# Patient Record
Sex: Male | Born: 1950 | Race: White | Hispanic: No | Smoking: Former smoker
Health system: Southern US, Community
[De-identification: ages and names within clinical notes are randomized; demographics above are authoritative.]

## PROBLEM LIST (undated history)

## (undated) DIAGNOSIS — M1990 Unspecified osteoarthritis, unspecified site: Secondary | ICD-10-CM

## (undated) DIAGNOSIS — I10 Essential (primary) hypertension: Secondary | ICD-10-CM

## (undated) DIAGNOSIS — K219 Gastro-esophageal reflux disease without esophagitis: Secondary | ICD-10-CM

## (undated) DIAGNOSIS — H269 Unspecified cataract: Secondary | ICD-10-CM

## (undated) DIAGNOSIS — I429 Cardiomyopathy, unspecified: Secondary | ICD-10-CM

## (undated) DIAGNOSIS — F32A Depression, unspecified: Secondary | ICD-10-CM

## (undated) DIAGNOSIS — I2510 Atherosclerotic heart disease of native coronary artery without angina pectoris: Secondary | ICD-10-CM

## (undated) DIAGNOSIS — I25118 Atherosclerotic heart disease of native coronary artery with other forms of angina pectoris: Secondary | ICD-10-CM

## (undated) DIAGNOSIS — Z87442 Personal history of urinary calculi: Secondary | ICD-10-CM

## (undated) DIAGNOSIS — I493 Ventricular premature depolarization: Secondary | ICD-10-CM

## (undated) DIAGNOSIS — R0600 Dyspnea, unspecified: Secondary | ICD-10-CM

## (undated) DIAGNOSIS — E119 Type 2 diabetes mellitus without complications: Secondary | ICD-10-CM

## (undated) DIAGNOSIS — F329 Major depressive disorder, single episode, unspecified: Secondary | ICD-10-CM

## (undated) DIAGNOSIS — F419 Anxiety disorder, unspecified: Secondary | ICD-10-CM

## (undated) HISTORY — DX: Cardiomyopathy, unspecified: I42.9

## (undated) HISTORY — DX: Atherosclerotic heart disease of native coronary artery with other forms of angina pectoris: I25.118

## (undated) HISTORY — PX: LUNG SURGERY: SHX703

## (undated) HISTORY — DX: Essential (primary) hypertension: I10

## (undated) HISTORY — PX: FOOT SURGERY: SHX648

## (undated) HISTORY — DX: Type 2 diabetes mellitus without complications: E11.9

## (undated) HISTORY — PX: CARDIAC CATHETERIZATION: SHX172

## (undated) HISTORY — DX: Ventricular premature depolarization: I49.3

---
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
## 2014-12-29 DIAGNOSIS — E119 Type 2 diabetes mellitus without complications: Secondary | ICD-10-CM

## 2014-12-29 DIAGNOSIS — I10 Essential (primary) hypertension: Secondary | ICD-10-CM

## 2014-12-29 DIAGNOSIS — I493 Ventricular premature depolarization: Secondary | ICD-10-CM | POA: Insufficient documentation

## 2014-12-29 HISTORY — DX: Essential (primary) hypertension: I10

## 2014-12-29 HISTORY — DX: Type 2 diabetes mellitus without complications: E11.9

## 2014-12-29 HISTORY — DX: Ventricular premature depolarization: I49.3

## 2015-03-18 DIAGNOSIS — I25118 Atherosclerotic heart disease of native coronary artery with other forms of angina pectoris: Secondary | ICD-10-CM

## 2015-03-18 HISTORY — DX: Atherosclerotic heart disease of native coronary artery with other forms of angina pectoris: I25.118

## 2015-04-19 DIAGNOSIS — I429 Cardiomyopathy, unspecified: Secondary | ICD-10-CM

## 2015-04-19 HISTORY — DX: Cardiomyopathy, unspecified: I42.9

## 2018-03-18 ENCOUNTER — Encounter: Payer: Self-pay | Admitting: Cardiology

## 2018-03-18 ENCOUNTER — Ambulatory Visit (INDEPENDENT_AMBULATORY_CARE_PROVIDER_SITE_OTHER): Payer: Medicare Other | Admitting: Cardiology

## 2018-03-18 VITALS — BP 130/82 | HR 59 | Ht 68.0 in | Wt 211.0 lb

## 2018-03-18 DIAGNOSIS — I493 Ventricular premature depolarization: Secondary | ICD-10-CM | POA: Diagnosis not present

## 2018-03-18 DIAGNOSIS — E119 Type 2 diabetes mellitus without complications: Secondary | ICD-10-CM

## 2018-03-18 DIAGNOSIS — I10 Essential (primary) hypertension: Secondary | ICD-10-CM | POA: Diagnosis not present

## 2018-03-18 DIAGNOSIS — R001 Bradycardia, unspecified: Secondary | ICD-10-CM

## 2018-03-18 HISTORY — DX: Bradycardia, unspecified: R00.1

## 2018-03-18 NOTE — Progress Notes (Signed)
Cardiology Office Note:    Date:  03/18/2018   ID:  Jerry Myers, DOB 03-Jul-1950, MRN [id number]  PCP:  Mikael Spray, NP  Cardiologist:  Garwin Brothers, MD   Referring MD: Mikael Spray, NP    ASSESSMENT:    1. Ventricular extrasystoles   2. Essential hypertension   3. Type 2 diabetes mellitus without complication, without long-term current use of insulin (HCC)   4. Bradycardia    PLAN:    In order of problems listed above:  1. Primary prevention stressed with the patient.  Importance of compliance with diet and medication stressed and he vocalized understanding.  His blood pressure is stable.  Diet was discussed for diabetes mellitus also. 2. His blood pressure is stable.  His heart rate is stable.  I will do a 48-hour monitor to assess his bradycardia if there are any significant concerns.  His TSH was normal at [facility]. 3. I reviewed records at [facility] extensively and questions were answered to his satisfaction. 4. Echocardiogram will be done to assess murmur heard on auscultation.  It will also help me assess his left ventricular systolic function in view of congestive heart failure history. 5. Patient will undergo exercise stress Cardiolite in view of multiple risk factors for coronary artery disease and history of coronary artery disease mentioned in his chart. 6. Patient will be seen in follow-up appointment in 6 months or earlier if the patient has any concerns    Medication Adjustments/Labs and Tests Ordered: Current medicines are reviewed at length with the patient today.  Concerns regarding medicines are outlined above.  No orders of the defined types were placed in this encounter.  No orders of the defined types were placed in this encounter.    No chief complaint on file.    History of Present Illness:    Jerry Myers is a 67 y.o. male.  Patient was evaluated by my partner in the past.  He was evaluated in the previous practice  and now he wants to get established here.  He has history of cardiomyopathy and I do not have much details about this.  No chest pain orthopnea or PND.  He denies coronary artery disease.  He went to the emergency room because he was sent by his primary care provider because of bradycardia.  Patient denies any symptoms of dizziness syncope or any such problems.  At the time of my evaluation, the patient is alert awake oriented and in no distress.  He is active gentleman but leads a sedentary lifestyle.  Past Medical History:  Diagnosis Date  . Cardiomyopathy (HCC) 04/19/2015   Ejection fraction 4045% in the fall of 2016  . Coronary artery disease of native artery of native heart with stable angina pectoris (HCC) 03/18/2015  . Essential hypertension 12/29/2014  . Type 2 diabetes mellitus without complication (HCC) 12/29/2014  . 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 FOOT SURGERY    . LUNG SURGERY      Current Medications: Current Meds  Medication Sig  . amLODipine (NORVASC) 5 MG tablet Take 5 mg by mouth daily.  Marland Kitchen aspirin EC 81 MG tablet Take 81 mg by mouth daily.  Marland Kitchen atorvastatin (LIPITOR) 20 MG tablet Take 20 mg by mouth daily.  Marland Kitchen gabapentin (NEURONTIN) 300 MG capsule Take 300 mg by mouth 2 (two) times daily.  Marland Kitchen glimepiride (AMARYL) 1 MG tablet Take 1 mg by mouth daily with breakfast.  .  glipiZIDE (GLUCOTROL XL) 5 MG 24 hr tablet Take 5 mg by mouth daily with breakfast.  . lisinopril (PRINIVIL,ZESTRIL) 20 MG tablet Take 20 mg by mouth daily.  . meloxicam (MOBIC) 7.5 MG tablet Take 7.5 mg by mouth daily.  . metFORMIN (GLUCOPHAGE) 500 MG tablet Take 500 mg by mouth daily.  . metoprolol succinate (TOPROL-XL) 25 MG 24 hr tablet Take 1.5 tablets by mouth daily.  . sitaGLIPtin (JANUVIA) 100 MG tablet Take 100 mg by mouth daily.     Allergies:   Patient has no known allergies.   Social History   Socioeconomic History  . Marital status:  Married    Spouse name: Not on file  . Number of children: Not on file  . Years of education: Not on file  . Highest education level: Not on file  Occupational History  . Not on file  Social Needs  . Financial resource strain: Not on file  . Food insecurity:    Worry: Not on file    Inability: Not on file  . Transportation needs:    Medical: Not on file    Non-medical: Not on file  Tobacco Use  . Smoking status: Never Smoker  . Smokeless tobacco: Never Used  Substance and Sexual Activity  . Alcohol use: Not on file  . Drug use: Not on file  . Sexual activity: Not on file  Lifestyle  . Physical activity:    Days per week: Not on file    Minutes per session: Not on file  . Stress: Not on file  Relationships  . Social connections:    Talks on phone: Not on file    Gets together: Not on file    Attends religious service: Not on file    Active member of club or organization: Not on file    Attends meetings of clubs or organizations: Not on file    Relationship status: Not on file  Other Topics Concern  . Not on file  Social History Narrative  . Not on file     Family History: The patient's family history is not on file.  ROS:   Please see the history of present illness.    All other systems reviewed and are negative.  EKGs/Labs/Other Studies Reviewed:    The following studies were reviewed today: EKG reveals sinus rhythm and nonspecific ST-T changes.   Recent Labs: No results found for requested labs within last 8760 hours.  Recent Lipid Panel No results found for: CHOL, TRIG, HDL, CHOLHDL, VLDL, LDLCALC, LDLDIRECT  Physical Exam:    VS:  BP 130/82 (BP Location: Right Arm, Patient Position: Sitting, Cuff Size: Normal)   Pulse (!) 59   Ht 5\' 8"  (1.727 m)   Wt 211 lb (95.7 kg)   SpO2 98%   BMI 32.08 kg/m     Wt Readings from Last 3 Encounters:  03/18/18 211 lb (95.7 kg)     GEN: Patient is in no acute distress HEENT: Normal NECK: No JVD; No carotid  bruits LYMPHATICS: No lymphadenopathy CARDIAC: Hear sounds regular, 2/6 systolic murmur at the apex. RESPIRATORY:  Clear to auscultation without rales, wheezing or rhonchi  ABDOMEN: Soft, non-tender, non-distended MUSCULOSKELETAL:  No edema; No deformity  SKIN: Warm and dry NEUROLOGIC:  Alert and oriented x 3 PSYCHIATRIC:  Normal affect   Signed, Garwin Brothersajan R Revankar, MD  03/18/2018 12:00 PM     [facility]

## 2018-03-18 NOTE — Patient Instructions (Signed)
Medication Instructions:  Your physician recommends that you continue on your current medications as directed. Please refer to the Current Medication list given to you today.  If you need a refill on your cardiac medications before your next appointment, please call your pharmacy.   Lab work: None  If you have labs (blood work) drawn today and your tests are completely normal, you will receive your results only by: Marland Kitchen. MyChart Message (if you have MyChart) OR . A paper copy in the mail If you have any lab test that is abnormal or we need to change your treatment, we will call you to review the results.  Testing/Procedures: Your physician has requested that you have an echocardiogram. Echocardiography is a painless test that uses sound waves to create images of your heart. It provides your doctor with information about the size and shape of your heart and how well your heart's chambers and valves are working. This procedure takes approximately one hour. There are no restrictions for this procedure.  Your physician has requested that you have en exercise stress myoview. For further information please visit [email or web address]. Please follow instruction sheet, as given.  Your physician has recommended that you wear a holter monitor. Holter monitors are medical devices that record the heart's electrical activity. Doctors most often use these monitors to diagnose arrhythmias. Arrhythmias are problems with the speed or rhythm of the heartbeat. The monitor is a small, portable device. You can wear one while you do your normal daily activities. This is usually used to diagnose what is causing palpitations/syncope (passing out).  Follow-Up: At [facility], you and your health needs are our priority.  As part of our continuing mission to provide you with exceptional heart care, we have created designated Provider Care Teams.  These Care Teams include your primary Cardiologist (physician) and Advanced  Practice Providers (APPs -  Physician Assistants and Nurse Practitioners) who all work together to provide you with the care you need, when you need it.  You will need a follow up appointment in 6 months.  Please call our office 2 months in advance to schedule this appointment.  You may see another member of our [facility] Provider Team in [place]: Gypsy Balsamobert Krasowski, MD . Norman HerrlichBrian Munley, MD  Any Other Special Instructions Will Be Listed Below (If Applicable).

## 2018-04-17 ENCOUNTER — Telehealth: Payer: Self-pay

## 2018-04-17 NOTE — Telephone Encounter (Signed)
patients home health nurse called and states patients heart rate was 35  And also patient takes metoprolol 25 succinate half a tablet daily but patient was feeling good so per Dr.Revankar wants patient to stop metoprolol and call us next week and let us know how he is feeling.

## 2018-05-08 ENCOUNTER — Other Ambulatory Visit: Payer: Medicare Other

## 2018-05-09 ENCOUNTER — Ambulatory Visit (INDEPENDENT_AMBULATORY_CARE_PROVIDER_SITE_OTHER): Payer: Medicare Other

## 2018-05-09 ENCOUNTER — Encounter: Payer: Self-pay | Admitting: *Deleted

## 2018-05-09 ENCOUNTER — Other Ambulatory Visit: Payer: Medicare Other

## 2018-05-09 DIAGNOSIS — I493 Ventricular premature depolarization: Secondary | ICD-10-CM

## 2018-05-09 NOTE — Progress Notes (Signed)
Complete echocardiogram has been performed.  Jimmy Jo Cerone, RDCS, RVT 

## 2018-05-12 ENCOUNTER — Telehealth: Payer: Self-pay | Admitting: Emergency Medicine

## 2018-05-12 DIAGNOSIS — Z8679 Personal history of other diseases of the circulatory system: Secondary | ICD-10-CM

## 2018-05-12 NOTE — Telephone Encounter (Signed)
Patient returned your call.

## 2018-05-12 NOTE — Telephone Encounter (Signed)
Left message for patient to return call.

## 2018-05-13 NOTE — Telephone Encounter (Signed)
Called patient back. Per Dr. Tomie China patient informed we will switch exercise myoview to lexiscan. Went over instructions for this testing with patient again. Advised patient to hold metformin, glipizide, glimepiride, hydrochlorothiazide, and januvia the day of the test. Also informed patient to hold metoprolol 24 hours before the test.  Patient verbally understands and will call us with any questions. Same appointment will remain, patient aware

## 2018-05-13 NOTE — Addendum Note (Signed)
Addended by: Lita Mains on: 05/13/2018 08:39 AM   Modules accepted: Orders

## 2018-05-14 ENCOUNTER — Telehealth (HOSPITAL_COMMUNITY): Payer: Self-pay | Admitting: *Deleted

## 2018-05-14 NOTE — Telephone Encounter (Signed)
Patient given detailed instructions per Myocardial Perfusion Study Information Sheet for the test on 05/28/18 at 0830. Patient notified to arrive 15 minutes early and that it is imperative to arrive on time for appointment to keep from having the test rescheduled.  If you need to cancel or reschedule your appointment, please call the office within 24 hours of your appointment. . Patient verbalized understanding.Sharne Linders, Adelene Idler

## 2018-05-14 NOTE — Telephone Encounter (Signed)
Patient called about changing his appointment to 2/5 at 0830 instead of 2/4 at 1100 due to a patient on 2/5 needs to be a 2D instead of 1D. Patient agreed to change his appointment but waiting on 2/5 patient to call back.Soraya Paquette, Adelene Idler

## 2018-05-26 ENCOUNTER — Telehealth (HOSPITAL_COMMUNITY): Payer: Self-pay | Admitting: *Deleted

## 2018-05-26 NOTE — Telephone Encounter (Signed)
Left message on voicemail per DPR in reference to upcoming appointment scheduled on 05/28/18 with detailed instructions given per Myocardial Perfusion Study Information Sheet for the test. LM to arrive 15 minutes early, and that it is imperative to arrive on time for appointment to keep from having the test rescheduled. If you need to cancel or reschedule your appointment, please call the office within 24 hours of your appointment. Failure to do so may result in a cancellation of your appointment, and a $50 no show fee. Phone number given for call back for any questions. Aldean Suddeth Jacqueline    

## 2018-05-28 ENCOUNTER — Ambulatory Visit (INDEPENDENT_AMBULATORY_CARE_PROVIDER_SITE_OTHER): Payer: Medicare Other

## 2018-05-28 VITALS — Ht 68.0 in | Wt 211.0 lb

## 2018-05-28 DIAGNOSIS — E119 Type 2 diabetes mellitus without complications: Secondary | ICD-10-CM

## 2018-05-28 DIAGNOSIS — I420 Dilated cardiomyopathy: Secondary | ICD-10-CM | POA: Diagnosis not present

## 2018-05-28 DIAGNOSIS — R9431 Abnormal electrocardiogram [ECG] [EKG]: Secondary | ICD-10-CM | POA: Diagnosis not present

## 2018-05-28 DIAGNOSIS — I10 Essential (primary) hypertension: Secondary | ICD-10-CM

## 2018-05-28 LAB — MYOCARDIAL PERFUSION IMAGING
CHL CUP NUCLEAR SRS: 9
CHL CUP NUCLEAR SSS: 14
LV dias vol: 2 mL (ref 62–150)
LV sys vol: 121 mL
NUC STRESS TID: 1
Peak HR: 84 {beats}/min
Rest BP: 57 mmHg
Rest HR: 57 {beats}/min
SDS: 5

## 2018-05-28 MED ORDER — TECHNETIUM TC 99M TETROFOSMIN IV KIT
31.1000 | PACK | Freq: Once | INTRAVENOUS | Status: AC | PRN
  Administered 2018-05-28: 31.1 via INTRAVENOUS

## 2018-05-28 MED ORDER — REGADENOSON 0.4 MG/5ML IV SOLN
0.4000 mg | Freq: Once | INTRAVENOUS | Status: AC
  Administered 2018-05-28: 0.4 mg via INTRAVENOUS

## 2018-05-28 MED ORDER — TECHNETIUM TC 99M TETROFOSMIN IV KIT
9.8000 | PACK | Freq: Once | INTRAVENOUS | Status: AC | PRN
  Administered 2018-05-28: 9.8 via INTRAVENOUS

## 2018-06-03 ENCOUNTER — Ambulatory Visit (INDEPENDENT_AMBULATORY_CARE_PROVIDER_SITE_OTHER): Payer: Medicare Other | Admitting: Cardiology

## 2018-06-03 ENCOUNTER — Encounter: Payer: Self-pay | Admitting: Cardiology

## 2018-06-03 VITALS — BP 162/70 | HR 86 | Ht 68.0 in | Wt 212.0 lb

## 2018-06-03 DIAGNOSIS — I4729 Other ventricular tachycardia: Secondary | ICD-10-CM | POA: Insufficient documentation

## 2018-06-03 DIAGNOSIS — I10 Essential (primary) hypertension: Secondary | ICD-10-CM

## 2018-06-03 DIAGNOSIS — R931 Abnormal findings on diagnostic imaging of heart and coronary circulation: Secondary | ICD-10-CM

## 2018-06-03 DIAGNOSIS — R001 Bradycardia, unspecified: Secondary | ICD-10-CM

## 2018-06-03 DIAGNOSIS — I472 Ventricular tachycardia: Secondary | ICD-10-CM

## 2018-06-03 DIAGNOSIS — I493 Ventricular premature depolarization: Secondary | ICD-10-CM | POA: Diagnosis not present

## 2018-06-03 DIAGNOSIS — E119 Type 2 diabetes mellitus without complications: Secondary | ICD-10-CM

## 2018-06-03 HISTORY — DX: Abnormal findings on diagnostic imaging of heart and coronary circulation: R93.1

## 2018-06-03 HISTORY — DX: Ventricular tachycardia: I47.2

## 2018-06-03 HISTORY — DX: Other ventricular tachycardia: I47.29

## 2018-06-03 NOTE — Patient Instructions (Addendum)
Medication Instructions:   Your physician recommends that you continue on your current medications as directed. Please refer to the Current Medication list given to you today.   If you need a refill on your cardiac medications before your next appointment, please call your pharmacy.   Lab work:  Your physician recommends that you return for lab work today: BMP, CBC   If you have labs (blood work) drawn today and your tests are completely normal, you will receive your results only by: Marland Kitchen. MyChart Message (if you have MyChart) OR . A paper copy in the mail If you have any lab test that is abnormal or we need to change your treatment, we will call you to review the results.  Testing/Procedures:  A chest x-ray takes a picture of the organs and structures inside the chest, including the heart, lungs, and blood vessels. This test can show several things, including, whether the heart is enlarges; whether fluid is building up in the lungs; and whether pacemaker / defibrillator leads are still in place.     [facility] AT [place] [address] [place] [id number] Dept: [phone] Loc: [phone]  Jerry Myers  06/03/2018  You are scheduled for a Cardiac Catheterization on Thursday, February 13 with Dr. Cristal Deerhristopher End.  1. [facility]: [address] [place], [place] [address] at 5:30 AM (This time is two hours before your procedure to ensure your preparation). Free valet parking service is available.   Special note: Every effort is made to have your procedure done on time. Please understand that emergencies sometimes delay scheduled procedures.  2. Diet: Do not eat solid foods after midnight.  The patient may have clear liquids until 5am upon the day of the procedure.  3. Labs: You will need to have blood drawn today: BMP, CBC  4. Medication  instructions in preparation for your procedure:   Contrast Allergy: NKA   STOP Hydrochlorothiazide the morning of the cath.  Do not take diabetic medication the day of the cath and Hold 48 hours after:  Metformin   Hold Januvia, Glipizide, Glimepiride the day of the cath.   On the morning of your procedure, take your Aspirin and any morning medicines NOT listed above.  You may use sips of water.  5. Plan for one night stay--bring personal belongings. 6. Bring a current list of your medications and current insurance cards. 7. You MUST have a responsible person to drive you home. 8. Someone MUST be with you the first 24 hours after you arrive home or your discharge will be delayed. 9. Please wear clothes that are easy to get on and off and wear slip-on shoes.  Thank you for allowing us to care for you!   -- [facility]   Follow-Up: At [facility], you and your health needs are our priority.  As part of our continuing mission to provide you with exceptional heart care, we have created designated Provider Care Teams.  These Care Teams include your primary Cardiologist (physician) and Advanced Practice Providers (APPs -  Physician Assistants and Nurse Practitioners) who all work together to provide you with the care you need, when you need it.  . You will need a follow up appointment in 1 months.    Any Other Special Instructions Will Be Listed Below (If Applicable).      Coronary Angiogram With Stent  Coronary angiogram with stent placement is a procedure to widen or open a narrow blood vessel of the heart (coronary artery). Arteries may become blocked by cholesterol buildup (plaques) in the lining of the wall. When a coronary artery becomes partially blocked, blood flow to that area decreases. This may lead to chest pain or a heart attack (myocardial infarction). A stent is a small piece of metal that looks like mesh or a spring. Stent placement may  be done as treatment for a heart attack or right after a coronary angiogram in which a blocked artery is found. Let your health care provider know about:  Any allergies you have.  All medicines you are taking, including vitamins, herbs, eye drops, creams, and over-the-counter medicines.  Any problems you or family members have had with anesthetic medicines.  Any blood disorders you have.  Any surgeries you have had.  Any medical conditions you have.  Whether you are pregnant or may be pregnant. What are the risks? Generally, this is a safe procedure. However, problems may occur, including:  Damage to the heart or its blood vessels.  A return of blockage.  Bleeding, infection, or bruising at the insertion site.  A collection of blood under the skin (hematoma) at the insertion site.  A blood clot in another part of the body.  Kidney injury.  Allergic reaction to the dye or contrast that is used.  Bleeding into the abdomen (retroperitoneal bleeding). What happens before the procedure? Staying hydrated Follow instructions from your health care provider about hydration, which may include:  Up to 2 hours before the procedure - you may continue to drink clear liquids, such as water, clear fruit juice, black coffee, and plain tea.  Eating and drinking restrictions Follow instructions from your health care provider about eating and drinking, which may include:  8 hours before the procedure - stop eating heavy meals or foods such as meat, fried foods, or fatty foods.  6 hours before the procedure - stop eating light meals or foods, such as toast or cereal.  2 hours before the procedure - stop drinking clear liquids. Ask your health care provider about:  Changing or stopping your regular medicines. This is especially important if you are taking diabetes medicines or blood thinners.  Taking medicines such as ibuprofen. These medicines can thin your blood. Do not take these  medicines before your procedure if your health care provider instructs you not to. Generally, aspirin is recommended before a procedure of passing a small, thin tube (catheter) through a blood vessel and into the heart (cardiac catheterization). What happens during the procedure?   An IV tube will be inserted into one of your veins.  You will be given one or more of the following: ? A medicine to help you relax (sedative). ? A medicine to numb the area where the catheter will be inserted into an artery (local anesthetic).  To reduce your risk of infection: ? Your health care team will wash or sanitize their hands. ? Your skin will be washed with soap. ? Hair may be removed from the area where the catheter will be inserted.  Using a guide wire, the catheter will be inserted into an artery. The location may be in your groin, in your wrist, or in the fold of your arm (near your elbow).  A type of X-ray (fluoroscopy) will be used to help guide the catheter to the opening of the arteries in the heart.  A dye will be injected  into the catheter, and X-rays will be taken. The dye will help to show where any narrowing or blockages are located in the arteries.  A tiny wire will be guided to the blocked spot, and a balloon will be inflated to make the artery wider.  The stent will be expanded and will crush the plaques into the wall of the vessel. The stent will hold the area open and improve the blood flow. Most stents have a drug coating to reduce the risk of the stent narrowing over time.  The artery may be made wider using a drill, laser, or other tools to remove plaques.  When the blood flow is better, the catheter will be removed. The lining of the artery will grow over the stent, which stays where it was placed. This procedure may vary among health care providers and hospitals. What happens after the procedure?  If the procedure is done through the leg, you will be kept in bed lying flat  for about 6 hours. You will be instructed to not bend and not cross your legs.  The insertion site will be checked frequently.  The pulse in your foot or wrist will be checked frequently.  You may have additional blood tests, X-rays, and a test that records the electrical activity of your heart (electrocardiogram, or ECG). This information is not intended to replace advice given to you by your health care provider. Make sure you discuss any questions you have with your health care provider. Document Released: 10/14/2002 Document Revised: 07/19/2017 Document Reviewed: 11/13/2015 Elsevier Interactive Patient Education  2019 [employer].

## 2018-06-03 NOTE — H&P (View-Only) (Signed)
Cardiology Office Note:    Date:  06/03/2018   ID:  Jerry Myers, DOB 03-29-1951, MRN [id number]  PCP:  Mikael Spray, NP  Cardiologist:  Garwin Brothers, MD   Referring MD: Mikael Spray, NP    ASSESSMENT:    1. Abnormal nuclear cardiac imaging test   2. Cardiomyopathy, unspecified type (HCC)   3. Essential hypertension   4. Ventricular extrasystoles   5. Bradycardia   6. Type 2 diabetes mellitus without complication, without long-term current use of insulin (HCC)    PLAN:    In order of problems listed above:  1. Primary prevention stressed with the patient.  Importance of compliance with diet and medication stressed and he vocalized understanding.  He has multiple risk factors for coronary artery disease including diabetes mellitus.  He has a sedentary lifestyle.  Holter monitoring has revealed nonsustained ventricular tachycardia.  In view of this the following recommendations were made to the patient. 2. I discussed coronary angiography and left heart catheterization with the patient at extensive length. Procedure, benefits and potential risks were explained. Patient had multiple questions which were answered to the patient's satisfaction. Patient agreed and consented for the procedure. Further recommendations will be made based on the findings of the coronary angiography. In the interim. The patient has any significant symptoms he knows to go to the nearest emergency room.    Medication Adjustments/Labs and Tests Ordered: Current medicines are reviewed at length with the patient today.  Concerns regarding medicines are outlined above.  No orders of the defined types were placed in this encounter.  No orders of the defined types were placed in this encounter.    No chief complaint on file.    History of Present Illness:    Jerry Myers is a 68 y.o. male.  Patient has past medical history of essential hypertension, dyslipidemia and diabetes mellitus.  The  patient was evaluated for shortness of breath on exertion and had stress testing which revealed reversible defects suggesting ischemia and he is here for that evaluation.  He denies any chest pain.  He leads a very sedentary lifestyle.  At the time of my evaluation, the patient is alert awake oriented and in no distress.  His sister accompanies him for the visit.  Past Medical History:  Diagnosis Date  . Cardiomyopathy (HCC) 04/19/2015   Ejection fraction 4045% in the fall of 2016  . Coronary artery disease of native artery of native heart with stable angina pectoris (HCC) 03/18/2015  . Essential hypertension 12/29/2014  . Type 2 diabetes mellitus without complication (HCC) 12/29/2014  . 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 FOOT SURGERY    . LUNG SURGERY      Current Medications: Current Meds  Medication Sig  . amLODipine (NORVASC) 5 MG tablet Take 5 mg by mouth daily.  Marland Kitchen aspirin EC 81 MG tablet Take 81 mg by mouth daily.  Marland Kitchen atorvastatin (LIPITOR) 20 MG tablet Take 20 mg by mouth daily.  Marland Kitchen gabapentin (NEURONTIN) 300 MG capsule Take 300 mg by mouth 2 (two) times daily.  Marland Kitchen glimepiride (AMARYL) 1 MG tablet Take 1 mg by mouth daily with breakfast.  . glipiZIDE (GLUCOTROL XL) 5 MG 24 hr tablet Take 5 mg by mouth daily with breakfast.  . hydrochlorothiazide (HYDRODIURIL) 25 MG tablet Take 25 mg by mouth daily.  Marland Kitchen lisinopril (PRINIVIL,ZESTRIL) 20 MG tablet Take 20 mg by mouth daily.  . meloxicam (MOBIC) 7.5 MG tablet  Take 7.5 mg by mouth daily.  . metFORMIN (GLUCOPHAGE) 500 MG tablet Take 500 mg by mouth daily.  . metoprolol succinate (TOPROL-XL) 25 MG 24 hr tablet Take 1.5 tablets by mouth daily.  . sitaGLIPtin (JANUVIA) 100 MG tablet Take 100 mg by mouth daily.     Allergies:   Patient has no known allergies.   Social History   Socioeconomic History  . Marital status: Married    Spouse name: Not on file  . Number of children: Not  on file  . Years of education: Not on file  . Highest education level: Not on file  Occupational History  . Not on file  Social Needs  . Financial resource strain: Not on file  . Food insecurity:    Worry: Not on file    Inability: Not on file  . Transportation needs:    Medical: Not on file    Non-medical: Not on file  Tobacco Use  . Smoking status: Never Smoker  . Smokeless tobacco: Never Used  Substance and Sexual Activity  . Alcohol use: Not on file  . Drug use: Not on file  . Sexual activity: Not on file  Lifestyle  . Physical activity:    Days per week: Not on file    Minutes per session: Not on file  . Stress: Not on file  Relationships  . Social connections:    Talks on phone: Not on file    Gets together: Not on file    Attends religious service: Not on file    Active member of club or organization: Not on file    Attends meetings of clubs or organizations: Not on file    Relationship status: Not on file  Other Topics Concern  . Not on file  Social History Narrative  . Not on file     Family History: The patient's family history is not on file.  ROS:   Please see the history of present illness.    All other systems reviewed and are negative.  EKGs/Labs/Other Studies Reviewed:    The following studies were reviewed today: I discussed my findings and the stress test report with the patient at extensive length.  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   Recent Labs: No results found for requested labs within last 8760 hours.  Recent Lipid Panel No results found for: CHOL, TRIG, HDL, CHOLHDL, VLDL, LDLCALC, LDLDIRECT  Physical Exam:    VS:  BP (!) 162/70 (BP Location: Right Arm, Patient Position: Sitting, Cuff Size: Normal)   Pulse 86   Ht 5\' 8"  (1.727 m)   Wt 212 lb (96.2 kg)   SpO2 98%   BMI 32.23 kg/m     Wt Readings from Last 3 Encounters:  06/03/18 212 lb (96.2 kg)  05/28/18 211 lb (95.7 kg)  03/18/18 211 lb (95.7 kg)      GEN: Patient is in no acute distress HEENT: Normal NECK: No JVD; No carotid bruits LYMPHATICS: No lymphadenopathy CARDIAC: Hear sounds regular, 2/6 systolic murmur at the apex. RESPIRATORY:  Clear to auscultation without rales, wheezing or rhonchi  ABDOMEN: Soft, non-tender, non-distended MUSCULOSKELETAL:  No edema; No deformity  SKIN: Warm and dry NEUROLOGIC:  Alert and oriented x 3 PSYCHIATRIC:  Normal affect   Signed, Garwin Brothersajan R Aerika Groll, MD  06/03/2018 3:13 PM    [facility]

## 2018-06-03 NOTE — Progress Notes (Signed)
Cardiology Office Note:    Date:  06/03/2018   ID:  Jerry Myers, DOB 03-29-1951, MRN [id number]  PCP:  Mikael Spray, NP  Cardiologist:  Garwin Brothers, MD   Referring MD: Mikael Spray, NP    ASSESSMENT:    1. Abnormal nuclear cardiac imaging test   2. Cardiomyopathy, unspecified type (HCC)   3. Essential hypertension   4. Ventricular extrasystoles   5. Bradycardia   6. Type 2 diabetes mellitus without complication, without long-term current use of insulin (HCC)    PLAN:    In order of problems listed above:  1. Primary prevention stressed with the patient.  Importance of compliance with diet and medication stressed and he vocalized understanding.  He has multiple risk factors for coronary artery disease including diabetes mellitus.  He has a sedentary lifestyle.  Holter monitoring has revealed nonsustained ventricular tachycardia.  In view of this the following recommendations were made to the patient. 2. I discussed coronary angiography and left heart catheterization with the patient at extensive length. Procedure, benefits and potential risks were explained. Patient had multiple questions which were answered to the patient's satisfaction. Patient agreed and consented for the procedure. Further recommendations will be made based on the findings of the coronary angiography. In the interim. The patient has any significant symptoms he knows to go to the nearest emergency room.    Medication Adjustments/Labs and Tests Ordered: Current medicines are reviewed at length with the patient today.  Concerns regarding medicines are outlined above.  No orders of the defined types were placed in this encounter.  No orders of the defined types were placed in this encounter.    No chief complaint on file.    History of Present Illness:    Jerry Myers is a 68 y.o. male.  Patient has past medical history of essential hypertension, dyslipidemia and diabetes mellitus.  The  patient was evaluated for shortness of breath on exertion and had stress testing which revealed reversible defects suggesting ischemia and he is here for that evaluation.  He denies any chest pain.  He leads a very sedentary lifestyle.  At the time of my evaluation, the patient is alert awake oriented and in no distress.  His sister accompanies him for the visit.  Past Medical History:  Diagnosis Date  . Cardiomyopathy (HCC) 04/19/2015   Ejection fraction 4045% in the fall of 2016  . Coronary artery disease of native artery of native heart with stable angina pectoris (HCC) 03/18/2015  . Essential hypertension 12/29/2014  . Type 2 diabetes mellitus without complication (HCC) 12/29/2014  . 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 FOOT SURGERY    . LUNG SURGERY      Current Medications: Current Meds  Medication Sig  . amLODipine (NORVASC) 5 MG tablet Take 5 mg by mouth daily.  Marland Kitchen aspirin EC 81 MG tablet Take 81 mg by mouth daily.  Marland Kitchen atorvastatin (LIPITOR) 20 MG tablet Take 20 mg by mouth daily.  Marland Kitchen gabapentin (NEURONTIN) 300 MG capsule Take 300 mg by mouth 2 (two) times daily.  Marland Kitchen glimepiride (AMARYL) 1 MG tablet Take 1 mg by mouth daily with breakfast.  . glipiZIDE (GLUCOTROL XL) 5 MG 24 hr tablet Take 5 mg by mouth daily with breakfast.  . hydrochlorothiazide (HYDRODIURIL) 25 MG tablet Take 25 mg by mouth daily.  Marland Kitchen lisinopril (PRINIVIL,ZESTRIL) 20 MG tablet Take 20 mg by mouth daily.  . meloxicam (MOBIC) 7.5 MG tablet  Take 7.5 mg by mouth daily.  . metFORMIN (GLUCOPHAGE) 500 MG tablet Take 500 mg by mouth daily.  . metoprolol succinate (TOPROL-XL) 25 MG 24 hr tablet Take 1.5 tablets by mouth daily.  . sitaGLIPtin (JANUVIA) 100 MG tablet Take 100 mg by mouth daily.     Allergies:   Patient has no known allergies.   Social History   Socioeconomic History  . Marital status: Married    Spouse name: Not on file  . Number of children: Not  on file  . Years of education: Not on file  . Highest education level: Not on file  Occupational History  . Not on file  Social Needs  . Financial resource strain: Not on file  . Food insecurity:    Worry: Not on file    Inability: Not on file  . Transportation needs:    Medical: Not on file    Non-medical: Not on file  Tobacco Use  . Smoking status: Never Smoker  . Smokeless tobacco: Never Used  Substance and Sexual Activity  . Alcohol use: Not on file  . Drug use: Not on file  . Sexual activity: Not on file  Lifestyle  . Physical activity:    Days per week: Not on file    Minutes per session: Not on file  . Stress: Not on file  Relationships  . Social connections:    Talks on phone: Not on file    Gets together: Not on file    Attends religious service: Not on file    Active member of club or organization: Not on file    Attends meetings of clubs or organizations: Not on file    Relationship status: Not on file  Other Topics Concern  . Not on file  Social History Narrative  . Not on file     Family History: The patient's family history is not on file.  ROS:   Please see the history of present illness.    All other systems reviewed and are negative.  EKGs/Labs/Other Studies Reviewed:    The following studies were reviewed today: I discussed my findings and the stress test report with the patient at extensive length.  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   Recent Labs: No results found for requested labs within last 8760 hours.  Recent Lipid Panel No results found for: CHOL, TRIG, HDL, CHOLHDL, VLDL, LDLCALC, LDLDIRECT  Physical Exam:    VS:  BP (!) 162/70 (BP Location: Right Arm, Patient Position: Sitting, Cuff Size: Normal)   Pulse 86   Ht 5' 8" (1.727 m)   Wt 212 lb (96.2 kg)   SpO2 98%   BMI 32.23 kg/m     Wt Readings from Last 3 Encounters:  06/03/18 212 lb (96.2 kg)  05/28/18 211 lb (95.7 kg)  03/18/18 211 lb (95.7 kg)      GEN: Patient is in no acute distress HEENT: Normal NECK: No JVD; No carotid bruits LYMPHATICS: No lymphadenopathy CARDIAC: Hear sounds regular, 2/6 systolic murmur at the apex. RESPIRATORY:  Clear to auscultation without rales, wheezing or rhonchi  ABDOMEN: Soft, non-tender, non-distended MUSCULOSKELETAL:  No edema; No deformity  SKIN: Warm and dry NEUROLOGIC:  Alert and oriented x 3 PSYCHIATRIC:  Normal affect   Signed, Chick Cousins R Ada Woodbury, MD  06/03/2018 3:13 PM    [facility]  

## 2018-06-04 LAB — CBC
Hematocrit: 39 % (ref 37.5–51.0)
Hemoglobin: 13.6 g/dL (ref 13.0–17.7)
MCH: 30.4 pg (ref 26.6–33.0)
MCHC: 34.9 g/dL (ref 31.5–35.7)
MCV: 87 fL (ref 79–97)
Platelets: 263 10*3/uL (ref 150–450)
RBC: 4.48 x10E6/uL (ref 4.14–5.80)
RDW: 13 % (ref 11.6–15.4)
WBC: 8.5 10*3/uL (ref 3.4–10.8)

## 2018-06-04 LAB — BASIC METABOLIC PANEL
BUN/Creatinine Ratio: 20 (ref 10–24)
BUN: 22 mg/dL (ref 8–27)
CHLORIDE: 102 mmol/L (ref 96–106)
CO2: 22 mmol/L (ref 20–29)
Calcium: 9.8 mg/dL (ref 8.6–10.2)
Creatinine, Ser: 1.12 mg/dL (ref 0.76–1.27)
GFR calc Af Amer: 77 mL/min/{1.73_m2} (ref >59)
GFR calc non Af Amer: 66 mL/min/{1.73_m2} (ref >59)
Glucose: 175 mg/dL — ABNORMAL HIGH (ref 65–99)
Potassium: 4 mmol/L (ref 3.5–5.2)
Sodium: 141 mmol/L (ref 134–144)

## 2018-06-05 ENCOUNTER — Other Ambulatory Visit: Payer: Self-pay

## 2018-06-05 ENCOUNTER — Ambulatory Visit (HOSPITAL_COMMUNITY)
Admission: RE | Admit: 2018-06-05 | Discharge: 2018-06-05 | Disposition: A | Discharge status: Home / Self Care | Payer: Medicare Other | Attending: Internal Medicine | Admitting: Internal Medicine

## 2018-06-05 ENCOUNTER — Encounter (HOSPITAL_COMMUNITY)
Admission: RE | Disposition: A | Discharge status: Home / Self Care | Payer: Self-pay | Source: Home / Self Care | Attending: Internal Medicine

## 2018-06-05 DIAGNOSIS — Z7982 Long term (current) use of aspirin: Secondary | ICD-10-CM | POA: Diagnosis not present

## 2018-06-05 DIAGNOSIS — E785 Hyperlipidemia, unspecified: Secondary | ICD-10-CM | POA: Insufficient documentation

## 2018-06-05 DIAGNOSIS — E119 Type 2 diabetes mellitus without complications: Secondary | ICD-10-CM | POA: Diagnosis not present

## 2018-06-05 DIAGNOSIS — I25119 Atherosclerotic heart disease of native coronary artery with unspecified angina pectoris: Secondary | ICD-10-CM | POA: Diagnosis not present

## 2018-06-05 DIAGNOSIS — R9439 Abnormal result of other cardiovascular function study: Secondary | ICD-10-CM | POA: Diagnosis not present

## 2018-06-05 DIAGNOSIS — I2510 Atherosclerotic heart disease of native coronary artery without angina pectoris: Secondary | ICD-10-CM | POA: Diagnosis not present

## 2018-06-05 DIAGNOSIS — I4729 Other ventricular tachycardia: Secondary | ICD-10-CM

## 2018-06-05 DIAGNOSIS — R079 Chest pain, unspecified: Secondary | ICD-10-CM | POA: Diagnosis present

## 2018-06-05 DIAGNOSIS — I429 Cardiomyopathy, unspecified: Secondary | ICD-10-CM | POA: Insufficient documentation

## 2018-06-05 DIAGNOSIS — R001 Bradycardia, unspecified: Secondary | ICD-10-CM

## 2018-06-05 DIAGNOSIS — R931 Abnormal findings on diagnostic imaging of heart and coronary circulation: Secondary | ICD-10-CM

## 2018-06-05 DIAGNOSIS — I2582 Chronic total occlusion of coronary artery: Secondary | ICD-10-CM | POA: Insufficient documentation

## 2018-06-05 DIAGNOSIS — I2584 Coronary atherosclerosis due to calcified coronary lesion: Secondary | ICD-10-CM | POA: Diagnosis not present

## 2018-06-05 DIAGNOSIS — I493 Ventricular premature depolarization: Secondary | ICD-10-CM

## 2018-06-05 DIAGNOSIS — R0789 Other chest pain: Secondary | ICD-10-CM | POA: Diagnosis present

## 2018-06-05 DIAGNOSIS — I472 Ventricular tachycardia: Secondary | ICD-10-CM | POA: Diagnosis not present

## 2018-06-05 DIAGNOSIS — Z7984 Long term (current) use of oral hypoglycemic drugs: Secondary | ICD-10-CM | POA: Diagnosis not present

## 2018-06-05 DIAGNOSIS — I10 Essential (primary) hypertension: Secondary | ICD-10-CM | POA: Diagnosis not present

## 2018-06-05 DIAGNOSIS — Z79899 Other long term (current) drug therapy: Secondary | ICD-10-CM | POA: Diagnosis not present

## 2018-06-05 HISTORY — DX: Chest pain, unspecified: R07.9

## 2018-06-05 HISTORY — PX: LEFT HEART CATH AND CORONARY ANGIOGRAPHY: CATH118249

## 2018-06-05 LAB — GLUCOSE, CAPILLARY
Glucose-Capillary: 186 mg/dL — ABNORMAL HIGH (ref 70–99)
Glucose-Capillary: 192 mg/dL — ABNORMAL HIGH (ref 70–99)

## 2018-06-05 LAB — POCT ACTIVATED CLOTTING TIME: Activated Clotting Time: 241 seconds

## 2018-06-05 SURGERY — LEFT HEART CATH AND CORONARY ANGIOGRAPHY
Anesthesia: LOCAL

## 2018-06-05 MED ORDER — LIDOCAINE HCL (PF) 1 % IJ SOLN
INTRAMUSCULAR | Status: AC
  Filled 2018-06-05: qty 30

## 2018-06-05 MED ORDER — SODIUM CHLORIDE 0.9% FLUSH: 3.0000 mL | Freq: Two times a day (BID) | INTRAVENOUS | Status: DC

## 2018-06-05 MED ORDER — MIDAZOLAM HCL 2 MG/2ML IJ SOLN
INTRAMUSCULAR | Status: DC | PRN
  Administered 2018-06-05: 1 mg via INTRAVENOUS

## 2018-06-05 MED ORDER — MIDAZOLAM HCL 2 MG/2ML IJ SOLN
INTRAMUSCULAR | Status: AC
  Filled 2018-06-05: qty 2

## 2018-06-05 MED ORDER — HYDRALAZINE HCL 20 MG/ML IJ SOLN: 5.0000 mg | INTRAMUSCULAR | Status: DC | PRN

## 2018-06-05 MED ORDER — HEPARIN (PORCINE) IN NACL 1000-0.9 UT/500ML-% IV SOLN
INTRAVENOUS | Status: DC | PRN
  Administered 2018-06-05 (×2): 500 mL

## 2018-06-05 MED ORDER — SODIUM CHLORIDE 0.9 % WEIGHT BASED INFUSION
3.0000 mL/kg/h | INTRAVENOUS | Status: AC
  Administered 2018-06-05: 3 mL/kg/h via INTRAVENOUS

## 2018-06-05 MED ORDER — IOHEXOL 350 MG/ML SOLN
INTRAVENOUS | Status: DC | PRN
  Administered 2018-06-05: 80 mL via INTRA_ARTERIAL

## 2018-06-05 MED ORDER — FENTANYL CITRATE (PF) 100 MCG/2ML IJ SOLN
INTRAMUSCULAR | Status: AC
  Filled 2018-06-05: qty 2

## 2018-06-05 MED ORDER — ACETAMINOPHEN 325 MG PO TABS: 650.0000 mg | ORAL_TABLET | ORAL | Status: DC | PRN

## 2018-06-05 MED ORDER — HEPARIN SODIUM (PORCINE) 1000 UNIT/ML IJ SOLN
INTRAMUSCULAR | Status: AC
  Filled 2018-06-05: qty 1

## 2018-06-05 MED ORDER — SODIUM CHLORIDE 0.9 % IV SOLN: INTRAVENOUS | Status: AC

## 2018-06-05 MED ORDER — SODIUM CHLORIDE 0.9 % IV SOLN: 250.0000 mL | INTRAVENOUS | Status: DC | PRN

## 2018-06-05 MED ORDER — VERAPAMIL HCL 2.5 MG/ML IV SOLN
INTRAVENOUS | Status: AC
  Filled 2018-06-05: qty 2

## 2018-06-05 MED ORDER — ASPIRIN 81 MG PO CHEW: 81.0000 mg | CHEWABLE_TABLET | Freq: Every day | ORAL | Status: DC

## 2018-06-05 MED ORDER — SODIUM CHLORIDE 0.9% FLUSH: 3.0000 mL | INTRAVENOUS | Status: DC | PRN

## 2018-06-05 MED ORDER — VERAPAMIL HCL 2.5 MG/ML IV SOLN
INTRAVENOUS | Status: DC | PRN
  Administered 2018-06-05: 10 mL via INTRA_ARTERIAL

## 2018-06-05 MED ORDER — HEPARIN SODIUM (PORCINE) 1000 UNIT/ML IJ SOLN
INTRAMUSCULAR | Status: DC | PRN
  Administered 2018-06-05: 2000 [IU] via INTRAVENOUS
  Administered 2018-06-05 (×2): 5000 [IU] via INTRAVENOUS

## 2018-06-05 MED ORDER — METFORMIN HCL 500 MG PO TABS: 500.0000 mg | ORAL_TABLET | Freq: Two times a day (BID) | ORAL | Status: DC

## 2018-06-05 MED ORDER — ONDANSETRON HCL 4 MG/2ML IJ SOLN: 4.0000 mg | Freq: Four times a day (QID) | INTRAMUSCULAR | Status: DC | PRN

## 2018-06-05 MED ORDER — NITROGLYCERIN 0.4 MG SL SUBL: 0.4000 mg | SUBLINGUAL_TABLET | SUBLINGUAL | 99 refills | Status: DC | PRN

## 2018-06-05 MED ORDER — LIDOCAINE HCL (PF) 1 % IJ SOLN
INTRAMUSCULAR | Status: DC | PRN
  Administered 2018-06-05: 2 mL

## 2018-06-05 MED ORDER — ASPIRIN 81 MG PO CHEW
81.0000 mg | CHEWABLE_TABLET | ORAL | Status: AC
  Administered 2018-06-05: 81 mg via ORAL
  Filled 2018-06-05: qty 1

## 2018-06-05 MED ORDER — FENTANYL CITRATE (PF) 100 MCG/2ML IJ SOLN
INTRAMUSCULAR | Status: DC | PRN
  Administered 2018-06-05 (×2): 25 ug via INTRAVENOUS

## 2018-06-05 MED ORDER — SODIUM CHLORIDE 0.9 % WEIGHT BASED INFUSION: 1.0000 mL/kg/h | INTRAVENOUS | Status: DC

## 2018-06-05 MED ORDER — LABETALOL HCL 5 MG/ML IV SOLN: 10.0000 mg | INTRAVENOUS | Status: DC | PRN

## 2018-06-05 SURGICAL SUPPLY — 14 items
CATH 5FR JL3.5 JR4 ANG PIG MP (CATHETERS) ×2 IMPLANT
CATH INFINITI 5FR JL5 (CATHETERS) ×2 IMPLANT
CATH LAUNCHER 6FR EBU 4 (CATHETERS) ×2 IMPLANT
DEVICE RAD COMP TR BAND LRG (VASCULAR PRODUCTS) ×2 IMPLANT
GLIDESHEATH SLEND SS 6F .021 (SHEATH) ×2 IMPLANT
GUIDEWIRE INQWIRE 1.5J.035X260 (WIRE) ×2 IMPLANT
GUIDEWIRE PRESSURE COMET II (WIRE) ×2 IMPLANT
INQWIRE 1.5J .035X260CM (WIRE) ×4
KIT HEART LEFT (KITS) ×2 IMPLANT
KIT HEMO VALVE WATCHDOG (MISCELLANEOUS) ×2 IMPLANT
PACK CARDIAC CATHETERIZATION (CUSTOM PROCEDURE TRAY) ×2 IMPLANT
TRANSDUCER W/STOPCOCK (MISCELLANEOUS) ×2 IMPLANT
TUBING CIL FLEX 10 FLL-RA (TUBING) ×2 IMPLANT
WIRE HITORQ VERSACORE ST 145CM (WIRE) ×2 IMPLANT

## 2018-06-05 NOTE — Discharge Instructions (Signed)
Radial Site Care ° °This sheet gives you information about how to care for yourself after your procedure. Your health care provider may also give you more specific instructions. If you have problems or questions, contact your health care provider. °What can I expect after the procedure? °After the procedure, it is common to have: °· Bruising and tenderness at the catheter insertion area. °Follow these instructions at home: °Medicines °· Take over-the-counter and prescription medicines only as told by your health care provider. °Insertion site care °· Follow instructions from your health care provider about how to take care of your insertion site. Make sure you: °? Wash your hands with soap and water before you change your bandage (dressing). If soap and water are not available, use hand sanitizer. °? Change your dressing as told by your health care provider. °? Leave stitches (sutures), skin glue, or adhesive strips in place. These skin closures may need to stay in place for 2 weeks or longer. If adhesive strip edges start to loosen and curl up, you may trim the loose edges. Do not remove adhesive strips completely unless your health care provider tells you to do that. °· Check your insertion site every day for signs of infection. Check for: °? Redness, swelling, or pain. °? Fluid or blood. °? Pus or a bad smell. °? Warmth. °· Do not take baths, swim, or use a hot tub until your health care provider approves. °· You may shower 24-48 hours after the procedure, or as directed by your health care provider. °? Remove the dressing and gently wash the site with plain soap and water. °? Pat the area dry with a clean towel. °? Do not rub the site. That could cause bleeding. °· Do not apply powder or lotion to the site. °Activity ° °· For 24 hours after the procedure, or as directed by your health care provider: °? Do not flex or bend the affected arm. °? Do not push or pull heavy objects with the affected arm. °? Do not  drive yourself home from the hospital or clinic. You may drive 24 hours after the procedure unless your health care provider tells you not to. °? Do not operate machinery or power tools. °· Do not lift anything that is heavier than 10 lb (4.5 kg), or the limit that you are told, until your health care provider says that it is safe. °· Ask your health care provider when it is okay to: °? Return to work or school. °? Resume usual physical activities or sports. °? Resume sexual activity. °General instructions °· If the catheter site starts to bleed, raise your arm and put firm pressure on the site. If the bleeding does not stop, get help right away. This is a medical emergency. °· If you went home on the same day as your procedure, a responsible adult should be with you for the first 24 hours after you arrive home. °· Keep all follow-up visits as told by your health care provider. This is important. °Contact a health care provider if: °· You have a fever. °· You have redness, swelling, or yellow drainage around your insertion site. °Get help right away if: °· You have unusual pain at the radial site. °· The catheter insertion area swells very fast. °· The insertion area is bleeding, and the bleeding does not stop when you hold steady pressure on the area. °· Your arm or hand becomes pale, cool, tingly, or numb. °These symptoms may represent a serious problem   that is an emergency. Do not wait to see if the symptoms will go away. Get medical help right away. Call your local emergency services (911 in the [place] Do not drive yourself to the hospital. °Summary °· After the procedure, it is common to have bruising and tenderness at the site. °· Follow instructions from your health care provider about how to take care of your radial site wound. Check the wound every day for signs of infection. °· Do not lift anything that is heavier than 10 lb (4.5 kg), or the limit that you are told, until your health care provider says  that it is safe. °This information is not intended to replace advice given to you by your health care provider. Make sure you discuss any questions you have with your health care provider. °Document Released: 05/12/2010 Document Revised: 05/15/2017 Document Reviewed: 05/15/2017 °Elsevier Interactive Patient Education © 2019 [employer] ° ° ° °Moderate Conscious Sedation, Adult, Care After °These instructions provide you with information about caring for yourself after your procedure. Your health care provider may also give you more specific instructions. Your treatment has been planned according to current medical practices, but problems sometimes occur. Call your health care provider if you have any problems or questions after your procedure. °What can I expect after the procedure? °After your procedure, it is common: °· To feel sleepy for several hours. °· To feel clumsy and have poor balance for several hours. °· To have poor judgment for several hours. °· To vomit if you eat too soon. °Follow these instructions at home: °For at least 24 hours after the procedure: ° °· Do not: °? Participate in activities where you could fall or become injured. °? Drive. °? Use heavy machinery. °? Drink alcohol. °? Take sleeping pills or medicines that cause drowsiness. °? Make important decisions or sign legal documents. °? Take care of children on your own. °· Rest. °Eating and drinking °· Follow the diet recommended by your health care provider. °· If you vomit: °? Drink water, juice, or soup when you can drink without vomiting. °? Make sure you have little or no nausea before eating solid foods. °General instructions °· Have a responsible adult stay with you until you are awake and alert. °· Take over-the-counter and prescription medicines only as told by your health care provider. °· If you smoke, do not smoke without supervision. °· Keep all follow-up visits as told by your health care provider. This is  important. °Contact a health care provider if: °· You keep feeling nauseous or you keep vomiting. °· You feel light-headed. °· You develop a rash. °· You have a fever. °Get help right away if: °· You have trouble breathing. °This information is not intended to replace advice given to you by your health care provider. Make sure you discuss any questions you have with your health care provider. °Document Released: 01/28/2013 Document Revised: 09/12/2015 Document Reviewed: 07/30/2015 °Elsevier Interactive Patient Education © 2019 [employer] ° °

## 2018-06-05 NOTE — Interval H&P Note (Signed)
History and Physical Interval Note:  06/05/2018 7:12 AM  Jerry Myers  has presented today for cardiac catheterization, with the diagnosis of chest pain and abnormal stress test.  The various methods of treatment have been discussed with the patient and family. After consideration of risks, benefits and other options for treatment, the patient has consented to  Procedure(s): LEFT HEART CATH AND CORONARY ANGIOGRAPHY (N/A) as a surgical intervention .  The patient's history has been reviewed, patient examined, no change in status, stable for surgery.  I have reviewed the patient's chart and labs.  Questions were answered to the patient's satisfaction.    Cath Lab Visit (complete for each Cath Lab visit)  Clinical Evaluation Leading to the Procedure:   ACS: No.  Non-ACS:    Anginal Classification: CCS III  Anti-ischemic medical therapy: Maximal Therapy (2 or more classes of medications)  Non-Invasive Test Results: High-risk stress test findings: cardiac mortality >3%/year  Prior CABG: No previous CABG  Jerry Myers

## 2018-06-05 NOTE — Brief Op Note (Signed)
BRIEF CARDIAC CATHETERIZATION NOTE  DATE: 06/05/2018 TIME: 8:35 AM  PATIENT:  Jerry Myers  68 y.o. male  PRE-OPERATIVE DIAGNOSIS:  Chest pain and abnormal stress test  POST-OPERATIVE DIAGNOSIS:  Chest pain and abnormal stress test  PROCEDURE:  Procedure(s): LEFT HEART CATH AND CORONARY ANGIOGRAPHY (N/A)  SURGEON:  Surgeon(s) and Role:    Yvonne Kendall, MD - Primary  FINDINGS: 1. 3-vessel CAD.  Moderate diffuse LAD disease is hemodynamically significant (DFR = 0.71). 2. Normal LVEDP.  RECOMMENDATIONS: 1. Outpatient cardiac surgery consultation for CABG. 2. Aggressive secondary prevention.  Yvonne Kendall, MD [facility] Pager: [phone]

## 2018-06-05 NOTE — Addendum Note (Signed)
Addended by: Domingo Madeira on: 06/05/2018 11:50 AM   Modules accepted: Orders

## 2018-06-06 ENCOUNTER — Encounter (HOSPITAL_COMMUNITY): Payer: Self-pay | Admitting: Internal Medicine

## 2018-06-16 ENCOUNTER — Institutional Professional Consult (permissible substitution) (INDEPENDENT_AMBULATORY_CARE_PROVIDER_SITE_OTHER): Payer: Medicare Other | Admitting: Cardiothoracic Surgery

## 2018-06-16 ENCOUNTER — Encounter: Payer: Medicare Other | Admitting: Cardiothoracic Surgery

## 2018-06-16 ENCOUNTER — Encounter: Payer: Self-pay | Admitting: Cardiothoracic Surgery

## 2018-06-16 ENCOUNTER — Other Ambulatory Visit: Payer: Self-pay | Admitting: *Deleted

## 2018-06-16 VITALS — BP 150/85 | HR 75 | Resp 20 | Ht 68.0 in | Wt 212.0 lb

## 2018-06-16 DIAGNOSIS — E1159 Type 2 diabetes mellitus with other circulatory complications: Secondary | ICD-10-CM

## 2018-06-16 DIAGNOSIS — I255 Ischemic cardiomyopathy: Secondary | ICD-10-CM

## 2018-06-16 DIAGNOSIS — I25118 Atherosclerotic heart disease of native coronary artery with other forms of angina pectoris: Secondary | ICD-10-CM | POA: Diagnosis not present

## 2018-06-16 DIAGNOSIS — R079 Chest pain, unspecified: Secondary | ICD-10-CM

## 2018-06-16 NOTE — Progress Notes (Signed)
PCP is Mikael Spray, NP Referring Provider is End, Cristal Deer, MD  Chief Complaint  Patient presents with  . 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  Patient symptoms are dyspnea with exertion.  He was evaluated by Dr. Tomie China and Myoview stress test was positive for ischemia.  He subsequently underwent cardiac catheterization via right radial artery showing chronic occlusion of the RCA which fills faintly by collaterals, occlusion of the circumflex with small disease branches and moderate but diffuse coronary disease of the LAD measured by FFR to be hemodynamically significant.  LVEDP was 14.  Because of his diabetes, LV dysfunction, and symptoms is recommended for CABG.   Patient lives with alone after the death of his wife 2 years ago.  He is able to drive and care for himself although he has difficulty with reading, medications, and has home health nurse assistance.  He states he does do some yard work but in the winter is very sedentary in his activities.  Over 10 years ago the patient had a injury at work insisting of right shoulder injury, puncture to his chest with pneumothorax and chest tube, and left foot fracture.   The patient has never smoked.  He used to drink alcohol heavily but has not had alcohol in over 10 years.  He has poor diet, cooks for himself and eats a lot of junk food he admits  The patient denies any resting symptoms or resting chest pain.  There are very few medical records in the [facility] system. Past Medical History:  Diagnosis Date  . Cardiomyopathy (HCC)  04/19/2015   Ejection fraction 4045% in the fall of 2016  . Coronary artery disease of native artery of native heart with stable angina pectoris (HCC) 03/18/2015  . Essential hypertension 12/29/2014  . Type 2 diabetes mellitus without complication (HCC) 12/29/2014  . 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 FOOT SURGERY    . LEFT HEART CATH AND CORONARY ANGIOGRAPHY N/A 06/05/2018   Procedure: LEFT HEART CATH AND CORONARY ANGIOGRAPHY;  Surgeon: Yvonne Kendall, MD;  [facility]: [facility];  Service: Cardiovascular;  Laterality: N/A;  . LUNG SURGERY      History reviewed. No pertinent family history.  Social History Social History   Tobacco Use  . Smoking status: Never Smoker  . Smokeless tobacco: Never Used  Substance Use Topics  . Alcohol use: Not on file  . Drug use: Not on file    Current Outpatient Medications  Medication Sig Dispense Refill  . amLODipine (NORVASC) 5 MG tablet Take 5 mg by mouth daily.    Marland Kitchen aspirin EC 81 MG tablet Take 81 mg by mouth daily.    Marland Kitchen atorvastatin (LIPITOR) 20 MG tablet Take 20 mg by mouth daily.    Marland Kitchen gabapentin (NEURONTIN) 300 MG capsule Take 300 mg by mouth 2 (two) times daily.    Marland Kitchen glimepiride (AMARYL) 1 MG tablet Take 1 mg by mouth daily with breakfast.    . glipiZIDE (GLUCOTROL XL) 5 MG 24 hr tablet Take 5 mg by mouth daily with breakfast.    .  hydrochlorothiazide (HYDRODIURIL) 25 MG tablet Take 25 mg by mouth daily.    Marland Kitchen lisinopril (PRINIVIL,ZESTRIL) 20 MG tablet Take 20 mg by mouth daily.    . meloxicam (MOBIC) 7.5 MG tablet Take 7.5 mg by mouth daily.    . metFORMIN (GLUCOPHAGE) 500 MG tablet Take 1 tablet (500 mg total) by mouth 2 (two) times daily.    . metoprolol succinate (TOPROL-XL) 25 MG 24 hr tablet Take 1.5 tablets by mouth daily.    . nitroGLYCERIN (NITROSTAT) 0.4 MG SL tablet Place 1 tablet (0.4 mg total) under the tongue every 5 (five) minutes as needed for chest pain. 30  tablet prn  . sitaGLIPtin (JANUVIA) 100 MG tablet Take 100 mg by mouth daily.     No current facility-administered medications for this visit.     No Known Allergies  Review of Systems                    Review of Systems :  [ y ] = yes, [  ] = no        General :  Weight gain Cove.Etienne   ]    Weight loss  [   ]  Fatigue [  ]  Fever [  ]  Chills  [  ]                                          HEENT    Headache [  ]  Dizziness [  ]  Blurred vision [ y ] Glaucoma  [  ]                          Nosebleeds [  ] Painful or loose teeth [  ]                 Patient is edentulous       Cardiac :  Chest pain/ pressure [y exertional]  Resting SOB [  ] exertional SOB [ y ]                        Orthopnea [  ]  Pedal edema  [  ]  Palpitations [  ] Syncope/presyncope [ ]                         Paroxysmal nocturnal dyspnea [  ]         Pulmonary : cough [  ]  wheezing [  ]  Hemoptysis [  ] Sputum [  ] Snoring [  ]                              Pneumothorax [y remote traumatic]  Sleep apnea [  ]        GI : Vomiting [  ]  Dysphagia [  ]  Melena  [  ]  Abdominal pain [  ] BRBPR [  ]              Heart burn [  ]  Constipation [  ] Diarrhea  [  ] Colonoscopy [   ]        GU : Hematuria [  ]  Dysuria [  ]  Nocturia [  ]  UTI's [  ]        Vascular : Claudication [  ]  Rest pain [  ]  DVT [  ] Vein stripping [  ] leg ulcers [  ]                          TIA [  ] Stroke [  ]  Varicose veins [  ]        NEURO :  Headaches  [ y ] Seizures [  ] Vision changes [  ] Paresthesias [  ]                                               Musculoskeletal :  Arthritis [  ] Gout  [  ]  Back pain [  ]  Joint pain Cove.Etienne  ]        Skin :  Rash [  ]  Melanoma [  ] Sores [  ]        Heme : Bleeding problems [  ]Clotting Disorders [  ] Anemia [  ]Blood Transfusion         Endocrine : Diabetes [  ] Heat or Cold intolerance [  ] Polyuria [  ]excessive thirst         Psych : Depression [  ]  Anxiety [  ]  Psych  hospitalizations [  ] Memory change [  ]                                                                           BP (!) 150/85   Pulse 75   Resp 20   Ht  (1.727 m)   Wt 212 lb (96.2 kg)   SpO2 96% Comment: RA  BMI 32.23 kg/m  Physical Exam      Exam    General- alert and comfortable, obese 68 year old male who appears to have some cognitive deficit, accompanied by his brother.    Neck- no JVD, no cervical adenopathy palpable, no carotid bruit   Lungs- clear without rales, wheezes   Cor- regular rate and rhythm, no murmur , gallop   Abdomen- soft, non-tender   Extremities - warm, non-tender, minimal edema   Neuro- oriented, appropriate, no focal weakness   Diagnostic Tests: Three-vessel CAD in a diabetic pattern.  Suboptimal vessels for grafting.  LV function mild-moderately reduced. Chest x-ray shows pulmonary nodules Echocardiogram shows EF 40% with moderate MR  Impression: Ischemic cardiomyopathy, three-vessel CAD Obesity Hypertension Diabetes without information how well it is controlled History of alcohol abuse-remotely Pulmonary nodules on chest x-ray  Plan: Patient is in a difficult situation with moderate LV dysfunction, obesity, and severe three-vessel coronary disease in a diabetic pattern with suboptimal targets.  CABG would probably be his best long-term therapy however before scheduling the patient for surgery he will need assessment of hemoglobin A1c  diabetic control, liver function, and chest CT to assess multiple pulmonary nodularity. He will return the office after these studies are completed to  possibly schedule for surgery. Mikey Bussing, MD [facility] [phone]

## 2018-06-17 ENCOUNTER — Other Ambulatory Visit: Payer: Self-pay

## 2018-06-17 DIAGNOSIS — I25119 Atherosclerotic heart disease of native coronary artery with unspecified angina pectoris: Secondary | ICD-10-CM

## 2018-06-21 LAB — COMPLETE METABOLIC PANEL WITH GFR
AG Ratio: 1.6 (calc) (ref 1.0–2.5)
ALT: 30 U/L (ref 9–46)
AST: 14 U/L (ref 10–35)
Albumin: 4 g/dL (ref 3.6–5.1)
Alkaline phosphatase (APISO): 69 U/L (ref 35–144)
BUN: 25 mg/dL (ref 7–25)
CO2: 25 mmol/L (ref 20–32)
Calcium: 10.2 mg/dL (ref 8.6–10.3)
Chloride: 103 mmol/L (ref 98–110)
Creat: 1.06 mg/dL (ref 0.70–1.18)
GFR, Est African American: 82 mL/min/{1.73_m2} (ref >=60)
GFR, Est Non African American: 71 mL/min/{1.73_m2} (ref >=60)
Globulin: 2.5 g/dL (calc) (ref 1.9–3.7)
Glucose, Bld: 227 mg/dL — ABNORMAL HIGH (ref 65–139)
Potassium: 4.1 mmol/L (ref 3.5–5.3)
Sodium: 138 mmol/L (ref 135–146)
Total Bilirubin: 0.3 mg/dL (ref 0.2–1.2)
Total Protein: 6.5 g/dL (ref 6.1–8.1)

## 2018-06-21 LAB — LIPID PANEL
Cholesterol: 123 mg/dL (ref <200)
HDL: 42 mg/dL (ref >=40)
LDL Cholesterol (Calc): 60 mg/dL (calc)
Non-HDL Cholesterol (Calc): 81 mg/dL (calc) (ref <130)
Total CHOL/HDL Ratio: 2.9 (calc) (ref <5.0)
Triglycerides: 124 mg/dL (ref <150)

## 2018-06-21 LAB — HEMOGLOBIN A1C
Hgb A1c MFr Bld: 9.3 % of total Hgb — ABNORMAL HIGH (ref <5.7)
Mean Plasma Glucose: 220 (calc)
eAG (mmol/L): 12.2 (calc)

## 2018-06-24 ENCOUNTER — Inpatient Hospital Stay: Admission: RE | Admit: 2018-06-24 | Payer: Medicare Other | Source: Ambulatory Visit

## 2018-06-24 ENCOUNTER — Ambulatory Visit (INDEPENDENT_AMBULATORY_CARE_PROVIDER_SITE_OTHER): Payer: Medicare Other | Admitting: Cardiothoracic Surgery

## 2018-06-24 ENCOUNTER — Ambulatory Visit
Admission: RE | Admit: 2018-06-24 | Discharge: 2018-06-24 | Disposition: A | Discharge status: Home / Self Care | Payer: Medicare Other | Source: Ambulatory Visit | Attending: Cardiothoracic Surgery | Admitting: Cardiothoracic Surgery

## 2018-06-24 ENCOUNTER — Encounter: Payer: Self-pay | Admitting: Cardiothoracic Surgery

## 2018-06-24 VITALS — BP 134/78 | HR 61 | Resp 16 | Ht 68.0 in | Wt 212.6 lb

## 2018-06-24 DIAGNOSIS — R079 Chest pain, unspecified: Secondary | ICD-10-CM

## 2018-06-24 DIAGNOSIS — E1159 Type 2 diabetes mellitus with other circulatory complications: Secondary | ICD-10-CM | POA: Diagnosis not present

## 2018-06-24 DIAGNOSIS — I25119 Atherosclerotic heart disease of native coronary artery with unspecified angina pectoris: Secondary | ICD-10-CM

## 2018-06-24 MED ORDER — IOPAMIDOL (ISOVUE-300) INJECTION 61%
75.0000 mL | Freq: Once | INTRAVENOUS | Status: AC | PRN
  Administered 2018-06-24: 75 mL via INTRAVENOUS

## 2018-06-24 NOTE — Progress Notes (Signed)
PCP is Mikael Spray, NP Referring Provider is End, Cristal Deer, MD  Chief Complaint  Patient presents with  . Coronary Artery Disease    1 week f/u with Chest CT  . Lung Lesion   Patient returns for discussion of his severe three-vessel diabetic disease, EF moderately reduced 40%, moderate MR.  He was seen in consultation 2 weeks ago after stress test was positive and catheterization by Dr. Okey Dupre demonstrated severe three-vessel disease with chronic RCA occlusion, 90% OM stenosis, 75 to 80% LAD stenosis, LVEDP 12, EF 40%.  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  Patient has exertional symptoms of dyspnea and chest discomfort.  He denies any resting symptoms.  Conclusion after initial consultation: [id number] is in a difficult situation with moderate LV dysfunction, obesity, and severe three-vessel coronary disease in a diabetic pattern with suboptimal targets.  CABG would probably be his best long-term therapy however before scheduling the patient for surgery he will need assessment of hemoglobin A1c  diabetic control, liver function, and chest CT to assess multiple pulmonary nodularity. He will return the office after these studies are completed to possibly schedule for surgery.    Current office visit CT scan shows no significant pulmonary nodules or masses.  Thoracic aorta with mild calcification.  Coronaries with dense calcification.  No pleural effusion. LFTs with normal SGPT SGOT, normal bilirubin, normal alkaline phosphatase, protein 6.5 and creatinine 1.1.  Hemoglobin A1c elevated at 9.3  I had a long discussion with the patient regarding the potential benefit of CABG for treatment of his three-vessel diabetic CAD.  I doubt that mitral valve replacement will be needed based on his echo results.  He understands that CABG would give him the best chance for prolonged survival and preservation of LV function.  He also understands there is a 3 to 5% risk  of MI, infection, organ failure, death. Past Medical History:  Diagnosis Date  . Cardiomyopathy (HCC) 04/19/2015   Ejection fraction 4045% in the fall of 2016  . Coronary artery disease of native artery of native heart with stable angina pectoris (HCC) 03/18/2015  . Essential hypertension 12/29/2014  . Type 2 diabetes mellitus without complication (HCC) 12/29/2014  . 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 FOOT SURGERY    . LEFT HEART CATH AND CORONARY ANGIOGRAPHY N/A 06/05/2018   Procedure: LEFT HEART CATH AND CORONARY ANGIOGRAPHY;  Surgeon: Yvonne Kendall, MD;  Location: [facility];  Service: Cardiovascular;  Laterality: N/A;  . LUNG SURGERY      History reviewed. No pertinent family history.  Social History Social History   Tobacco Use  . Smoking status: Never Smoker  . Smokeless tobacco: Never Used  Substance Use Topics  . Alcohol use: Not on file  . Drug use: Not on file    Current Outpatient Medications  Medication Sig Dispense Refill  . amLODipine (NORVASC) 5 MG tablet Take 5 mg by mouth daily.    Marland Kitchen aspirin EC 81 MG tablet Take 81 mg by mouth daily.    Marland Kitchen atorvastatin (LIPITOR) 20 MG tablet Take 20 mg by mouth daily.    Marland Kitchen gabapentin (NEURONTIN) 300 MG capsule Take 300 mg by mouth 2 (two) times daily.    Marland Kitchen glimepiride (AMARYL) 1 MG tablet Take 1 mg by mouth daily with breakfast.    . glipiZIDE (GLUCOTROL XL) 5 MG 24 hr tablet Take 5 mg by mouth daily with breakfast.    .  hydrochlorothiazide (HYDRODIURIL) 25 MG tablet Take 25 mg by mouth daily.    Marland Kitchen lisinopril (PRINIVIL,ZESTRIL) 20 MG tablet Take 20 mg by mouth daily.    . meloxicam (MOBIC) 7.5 MG tablet Take 7.5 mg by mouth daily.    . metFORMIN (GLUCOPHAGE) 500 MG tablet Take 1 tablet (500 mg total) by mouth 2 (two) times daily.    . metoprolol succinate (TOPROL-XL) 25 MG 24 hr tablet Take 1.5 tablets by mouth daily.    . nitroGLYCERIN (NITROSTAT) 0.4 MG SL  tablet Place 1 tablet (0.4 mg total) under the tongue every 5 (five) minutes as needed for chest pain. 30 tablet prn  . sitaGLIPtin (JANUVIA) 100 MG tablet Take 100 mg by mouth daily.     No current facility-administered medications for this visit.     No Known Allergies  Review of Systems  No change since previous consultation Exertional symptoms of anginal equivalent BP 134/78 (BP Location: Left Arm, Patient Position: Sitting, Cuff Size: Large)   Pulse 61   Resp 16   Ht 5\' 8"  (1.727 m)   Wt 212 lb 9.6 oz (96.4 kg)   SpO2 96% Comment: RA  BMI 32.33 kg/m  Physical Exam      Exam    General- alert and comfortable    Neck- no JVD, no cervical adenopathy palpable, no carotid bruit   Lungs- clear without rales, wheezes   Cor- regular rate and rhythm, no murmur , gallop   Abdomen- soft, non-tender   Extremities - warm, non-tender, minimal edema   Neuro- oriented, appropriate, no focal weakness   Diagnostic Tests: Results of CT scan and blood work reviewed with patient and sister  Impression: Symptomatic severe three-vessel diabetic CAD with moderate LV dysfunction.  Patient would benefit from high risk CABG  Plan: Schedule CABG March 20 with plan bypass grafts to LAD, OM, PDA.  Mikey Bussing, MD [facility] [phone]

## 2018-06-25 ENCOUNTER — Other Ambulatory Visit: Payer: Self-pay

## 2018-06-25 ENCOUNTER — Other Ambulatory Visit: Payer: Self-pay | Admitting: *Deleted

## 2018-06-25 DIAGNOSIS — I25119 Atherosclerotic heart disease of native coronary artery with unspecified angina pectoris: Secondary | ICD-10-CM

## 2018-06-26 ENCOUNTER — Telehealth: Payer: Self-pay | Admitting: *Deleted

## 2018-06-26 NOTE — Telephone Encounter (Signed)
Attempted to call patient about the [facility] Research study. No option to leave message, I will try again later.

## 2018-07-01 ENCOUNTER — Ambulatory Visit: Payer: Medicare Other | Admitting: Cardiology

## 2018-07-08 NOTE — Pre-Procedure Instructions (Signed)
Ellias Nodarse  07/08/2018      [facility] - [place], [place] - [address] [place] [address] Phone: [phone] Fax: [phone]    Your procedure is scheduled on March 20th, 2020.  Report to [facility] Admitting at 05:30 A.M.  Call this number if you have problems the morning of surgery:  [phone]   Remember:  Do not eat or drink after midnight. This includes gum and hard candy.    Take these medicines the morning of surgery with A SIP OF WATER: Gabapentin and Metoprolol.    Do not wear jewelry, make-up or nail polish.  Do not wear lotions, powders, or perfumes, or deodorant.  Do not shave 48 hours prior to surgery.  Men may shave face and neck.  Do not bring valuables to the hospital.  [facility] is not responsible for any belongings or valuables.  Contacts, dentures or bridgework may not be worn into surgery.  Leave your suitcase in the car.  After surgery it may be brought to your room.  For patients admitted to the hospital, discharge time will be determined by your treatment team.  Patients discharged the day of surgery will not be allowed to drive home.    [facility]- Preparing For Surgery  Before surgery, you can play an important role. Because skin is not sterile, your skin needs to be as free of germs as possible. You can reduce the number of germs on your skin by washing with CHG (chlorahexidine gluconate) Soap before surgery.  CHG is an antiseptic cleaner which kills germs and bonds with the skin to continue killing germs even after washing.    Oral Hygiene is also important to reduce your risk of infection.  Remember - BRUSH YOUR TEETH THE MORNING OF SURGERY WITH YOUR REGULAR TOOTHPASTE  Please do not use if you have an allergy to CHG or antibacterial soaps. If your skin becomes reddened/irritated stop using the CHG.  Do not shave (including legs and underarms) for at least 48 hours prior to first CHG shower. It is  OK to shave your face.  Please follow these instructions carefully.   1. Shower the NIGHT BEFORE SURGERY and the MORNING OF SURGERY with CHG.   2. If you chose to wash your hair, wash your hair first as usual with your normal shampoo.  3. After you shampoo, rinse your hair and body thoroughly to remove the shampoo.  4. Use CHG as you would any other liquid soap. You can apply CHG directly to the skin and wash gently with a scrungie or a clean washcloth.   5. Apply the CHG Soap to your body ONLY FROM THE NECK DOWN.  Do not use on open wounds or open sores. Avoid contact with your eyes, ears, mouth and genitals (private parts). Wash Face and genitals (private parts)  with your normal soap.  6. Wash thoroughly, paying special attention to the area where your surgery will be performed.  7. Thoroughly rinse your body with warm water from the neck down.  8. DO NOT shower/wash with your normal soap after using and rinsing off the CHG Soap.  9. Pat yourself dry with a CLEAN TOWEL.  10. Wear CLEAN PAJAMAS to bed the night before surgery, wear comfortable clothes the morning of surgery  11. Place CLEAN SHEETS on your bed the night of your first shower and DO NOT SLEEP WITH PETS.    Day of Surgery:  Do  not apply any deodorants/lotions.  Please wear clean clothes to the hospital/surgery center.   Remember to brush your teeth WITH YOUR REGULAR TOOTHPASTE.

## 2018-07-09 ENCOUNTER — Ambulatory Visit (HOSPITAL_COMMUNITY)
Admission: RE | Admit: 2018-07-09 | Discharge: 2018-07-09 | Disposition: A | Discharge status: Home / Self Care | Payer: Medicare Other | Source: Ambulatory Visit | Attending: Cardiothoracic Surgery | Admitting: Cardiothoracic Surgery

## 2018-07-09 ENCOUNTER — Encounter (HOSPITAL_COMMUNITY)
Admission: RE | Admit: 2018-07-09 | Discharge: 2018-07-09 | Disposition: A | Discharge status: Home / Self Care | Payer: Medicare Other | Source: Ambulatory Visit | Attending: Cardiothoracic Surgery | Admitting: Cardiothoracic Surgery

## 2018-07-09 ENCOUNTER — Other Ambulatory Visit: Payer: Self-pay

## 2018-07-09 ENCOUNTER — Encounter (HOSPITAL_COMMUNITY): Payer: Medicare Other

## 2018-07-09 ENCOUNTER — Encounter (HOSPITAL_COMMUNITY): Payer: Self-pay

## 2018-07-09 DIAGNOSIS — I25119 Atherosclerotic heart disease of native coronary artery with unspecified angina pectoris: Secondary | ICD-10-CM

## 2018-07-09 DIAGNOSIS — I517 Cardiomegaly: Secondary | ICD-10-CM | POA: Insufficient documentation

## 2018-07-09 DIAGNOSIS — I6523 Occlusion and stenosis of bilateral carotid arteries: Secondary | ICD-10-CM | POA: Diagnosis not present

## 2018-07-09 HISTORY — DX: Anxiety disorder, unspecified: F41.9

## 2018-07-09 HISTORY — DX: Dyspnea, unspecified: R06.00

## 2018-07-09 LAB — URINALYSIS, ROUTINE W REFLEX MICROSCOPIC
Bilirubin Urine: NEGATIVE
Glucose, UA: 150 mg/dL — AB
Hgb urine dipstick: NEGATIVE
Ketones, ur: NEGATIVE mg/dL
Leukocytes,Ua: NEGATIVE
Nitrite: NEGATIVE
Protein, ur: NEGATIVE mg/dL
Specific Gravity, Urine: 1.018 (ref 1.005–1.030)
pH: 5 (ref 5.0–8.0)

## 2018-07-09 LAB — PULMONARY FUNCTION TEST
DL/VA % pred: 114 %
DL/VA: 4.67 ml/min/mmHg/L
DLCO unc % pred: 91 %
DLCO unc: 22.23 ml/min/mmHg
FEF 25-75 Post: 3.37 L/sec
FEF 25-75 Pre: 3.12 L/sec
FEF2575-%Change-Post: 8 %
FEF2575-%Pred-Post: 147 %
FEF2575-%Pred-Pre: 136 %
FEV1-%Change-Post: 2 %
FEV1-%Pred-Post: 93 %
FEV1-%Pred-Pre: 91 %
FEV1-Post: 2.82 L
FEV1-Pre: 2.74 L
FEV1FVC-%Change-Post: 4 %
FEV1FVC-%Pred-Pre: 112 %
FEV6-%Change-Post: 0 %
FEV6-%Pred-Post: 84 %
FEV6-%Pred-Pre: 85 %
FEV6-Post: 3.26 L
FEV6-Pre: 3.29 L
FEV6FVC-%Change-Post: 0 %
FEV6FVC-%Pred-Post: 106 %
FEV6FVC-%Pred-Pre: 105 %
FVC-%Change-Post: -1 %
FVC-%Pred-Post: 79 %
FVC-%Pred-Pre: 80 %
FVC-Post: 3.27 L
FVC-Pre: 3.31 L
Post FEV1/FVC ratio: 86 %
Post FEV6/FVC ratio: 100 %
Pre FEV1/FVC ratio: 83 %
Pre FEV6/FVC Ratio: 100 %

## 2018-07-09 LAB — COMPREHENSIVE METABOLIC PANEL
ALT: 31 U/L (ref 0–44)
AST: 26 U/L (ref 15–41)
Albumin: 3.7 g/dL (ref 3.5–5.0)
Alkaline Phosphatase: 64 U/L (ref 38–126)
Anion gap: 13 (ref 5–15)
BUN: 21 mg/dL (ref 8–23)
CO2: 16 mmol/L — ABNORMAL LOW (ref 22–32)
Calcium: 9.3 mg/dL (ref 8.9–10.3)
Chloride: 109 mmol/L (ref 98–111)
Creatinine, Ser: 1.09 mg/dL (ref 0.61–1.24)
GFR calc Af Amer: >60 mL/min (ref >60)
GFR calc non Af Amer: >60 mL/min (ref >60)
Glucose, Bld: 175 mg/dL — ABNORMAL HIGH (ref 70–99)
Potassium: 3.9 mmol/L (ref 3.5–5.1)
Sodium: 138 mmol/L (ref 135–145)
Total Bilirubin: 1 mg/dL (ref 0.3–1.2)
Total Protein: 6.4 g/dL — ABNORMAL LOW (ref 6.5–8.1)

## 2018-07-09 LAB — BLOOD GAS, ARTERIAL
Acid-base deficit: 2.2 mmol/L — ABNORMAL HIGH (ref 0.0–2.0)
Bicarbonate: 21.4 mmol/L (ref 20.0–28.0)
Drawn by: 470591
FIO2: 21
O2 Saturation: 97.8 %
Patient temperature: 98.6
pCO2 arterial: 32.6 mmHg (ref 32.0–48.0)
pH, Arterial: 7.433 (ref 7.350–7.450)
pO2, Arterial: 96.3 mmHg (ref 83.0–108.0)

## 2018-07-09 LAB — TYPE AND SCREEN
ABO/RH(D): A POS
Antibody Screen: NEGATIVE

## 2018-07-09 LAB — CBC
HCT: 42.9 % (ref 39.0–52.0)
Hemoglobin: 14.5 g/dL (ref 13.0–17.0)
MCH: 30.7 pg (ref 26.0–34.0)
MCHC: 33.8 g/dL (ref 30.0–36.0)
MCV: 90.7 fL (ref 80.0–100.0)
Platelets: 185 10*3/uL (ref 150–400)
RBC: 4.73 MIL/uL (ref 4.22–5.81)
RDW: 13.4 % (ref 11.5–15.5)
WBC: 7.7 10*3/uL (ref 4.0–10.5)
nRBC: 0 % (ref 0.0–0.2)

## 2018-07-09 LAB — HEMOGLOBIN A1C
Hgb A1c MFr Bld: 9.5 % — ABNORMAL HIGH (ref 4.8–5.6)
Mean Plasma Glucose: 225.95 mg/dL

## 2018-07-09 LAB — SURGICAL PCR SCREEN
MRSA, PCR: NEGATIVE
Staphylococcus aureus: NEGATIVE

## 2018-07-09 LAB — APTT: aPTT: 27 seconds (ref 24–36)

## 2018-07-09 LAB — GLUCOSE, CAPILLARY: Glucose-Capillary: 168 mg/dL — ABNORMAL HIGH (ref 70–99)

## 2018-07-09 LAB — PROTIME-INR
INR: 1 (ref 0.8–1.2)
Prothrombin Time: 12.7 seconds (ref 11.4–15.2)

## 2018-07-09 LAB — ABO/RH: ABO/RH(D): A POS

## 2018-07-09 MED ORDER — VANCOMYCIN HCL 10 G IV SOLR
1500.0000 mg | INTRAVENOUS | Status: DC
  Filled 2018-07-09: qty 1500

## 2018-07-09 MED ORDER — POTASSIUM CHLORIDE 2 MEQ/ML IV SOLN
80.0000 meq | INTRAVENOUS | Status: DC
  Filled 2018-07-09: qty 40

## 2018-07-09 MED ORDER — DOPAMINE-DEXTROSE 3.2-5 MG/ML-% IV SOLN
0.0000 ug/kg/min | INTRAVENOUS | Status: DC
  Filled 2018-07-09: qty 250

## 2018-07-09 MED ORDER — TRANEXAMIC ACID (OHS) BOLUS VIA INFUSION
15.0000 mg/kg | INTRAVENOUS | Status: DC
  Filled 2018-07-09: qty 1446

## 2018-07-09 MED ORDER — SODIUM CHLORIDE 0.9 % IV SOLN
750.0000 mg | INTRAVENOUS | Status: DC
  Filled 2018-07-09: qty 750

## 2018-07-09 MED ORDER — TRANEXAMIC ACID 1000 MG/10ML IV SOLN
1.5000 mg/kg/h | INTRAVENOUS | Status: DC
  Filled 2018-07-09: qty 25

## 2018-07-09 MED ORDER — PLASMA-LYTE 148 IV SOLN
INTRAVENOUS | Status: DC
  Filled 2018-07-09: qty 2.5

## 2018-07-09 MED ORDER — MILRINONE LACTATE IN DEXTROSE 20-5 MG/100ML-% IV SOLN
0.3000 ug/kg/min | INTRAVENOUS | Status: DC
  Filled 2018-07-09: qty 100

## 2018-07-09 MED ORDER — SODIUM CHLORIDE 0.9 % IV SOLN
1.5000 g | INTRAVENOUS | Status: DC
  Filled 2018-07-09: qty 1.5

## 2018-07-09 MED ORDER — NOREPINEPHRINE 4 MG/250ML-% IV SOLN
0.0000 ug/min | INTRAVENOUS | Status: DC
  Filled 2018-07-09: qty 250

## 2018-07-09 MED ORDER — MAGNESIUM SULFATE 50 % IJ SOLN
40.0000 meq | INTRAMUSCULAR | Status: DC
  Filled 2018-07-09: qty 9.85

## 2018-07-09 MED ORDER — PHENYLEPHRINE HCL-NACL 20-0.9 MG/250ML-% IV SOLN
30.0000 ug/min | INTRAVENOUS | Status: DC
  Filled 2018-07-09: qty 250

## 2018-07-09 MED ORDER — INSULIN REGULAR(HUMAN) IN NACL 100-0.9 UT/100ML-% IV SOLN
INTRAVENOUS | Status: DC
  Filled 2018-07-09: qty 100

## 2018-07-09 MED ORDER — SODIUM CHLORIDE 0.9 % IV SOLN
INTRAVENOUS | Status: DC
  Filled 2018-07-09: qty 30

## 2018-07-09 MED ORDER — TRANEXAMIC ACID (OHS) PUMP PRIME SOLUTION
2.0000 mg/kg | INTRAVENOUS | Status: DC
  Filled 2018-07-09: qty 1.93

## 2018-07-09 MED ORDER — EPINEPHRINE PF 1 MG/ML IJ SOLN
0.0000 ug/min | INTRAVENOUS | Status: DC
  Filled 2018-07-09: qty 4

## 2018-07-09 MED ORDER — ALBUTEROL SULFATE (2.5 MG/3ML) 0.083% IN NEBU
2.5000 mg | INHALATION_SOLUTION | Freq: Once | RESPIRATORY_TRACT | Status: AC
  Administered 2018-07-09: 2.5 mg via RESPIRATORY_TRACT

## 2018-07-09 MED ORDER — DEXMEDETOMIDINE HCL IN NACL 400 MCG/100ML IV SOLN
0.1000 ug/kg/h | INTRAVENOUS | Status: DC
  Filled 2018-07-09: qty 100

## 2018-07-09 MED ORDER — NITROGLYCERIN IN D5W 200-5 MCG/ML-% IV SOLN
2.0000 ug/min | INTRAVENOUS | Status: DC
  Filled 2018-07-09: qty 250

## 2018-07-09 NOTE — Progress Notes (Signed)
Pre-CABG testing has been completed. Preliminary results can be found in CV Proc through chart review.   07/09/18 10:52 AM Olen Cordial RVT

## 2018-07-09 NOTE — Progress Notes (Signed)
PCP -  Cardiologist - dr Tomie China  Chest x-ray - 07/09/18 EKG - 07/09/18 Stress Test -  ECHO -1/20  Cardiac Cath - 1/20  Sleep Study - none CPAP -   Fasting Blood Sugar - 168 Checks Blood Sugar _prn____ times a day  Blood Thinner Instructions: Aspirin Instructions:  Anesthesia review: heart hx. Pt cant read or write  Patient denies shortness of breath, fever, cough and chest pain at PAT appointment   Patient verbalized understanding of instructions that were given to them at the PAT appointment. Patient was also instructed that they will need to review over the PAT instructions again at home before surgery.

## 2018-07-10 ENCOUNTER — Encounter (HOSPITAL_COMMUNITY): Payer: Self-pay | Admitting: Physician Assistant

## 2018-07-14 ENCOUNTER — Telehealth: Payer: Self-pay | Admitting: *Deleted

## 2018-07-14 ENCOUNTER — Other Ambulatory Visit: Payer: Self-pay | Admitting: *Deleted

## 2018-07-14 MED ORDER — NITROGLYCERIN IN D5W 200-5 MCG/ML-% IV SOLN
2.0000 ug/min | INTRAVENOUS | Status: DC
  Filled 2018-07-14: qty 250

## 2018-07-14 MED ORDER — VANCOMYCIN HCL 10 G IV SOLR
1500.0000 mg | INTRAVENOUS | Status: DC
  Filled 2018-07-14: qty 1500

## 2018-07-14 MED ORDER — MAGNESIUM SULFATE 50 % IJ SOLN
40.0000 meq | INTRAMUSCULAR | Status: DC
  Filled 2018-07-14: qty 9.85

## 2018-07-14 MED ORDER — SODIUM CHLORIDE 0.9 % IV SOLN
1.5000 g | INTRAVENOUS | Status: DC
  Filled 2018-07-14: qty 1.5

## 2018-07-14 MED ORDER — EPINEPHRINE PF 1 MG/ML IJ SOLN
0.0000 ug/min | INTRAVENOUS | Status: DC
  Filled 2018-07-14: qty 4

## 2018-07-14 MED ORDER — INSULIN REGULAR(HUMAN) IN NACL 100-0.9 UT/100ML-% IV SOLN
INTRAVENOUS | Status: DC
  Filled 2018-07-14: qty 100

## 2018-07-14 MED ORDER — DEXMEDETOMIDINE HCL IN NACL 400 MCG/100ML IV SOLN
0.1000 ug/kg/h | INTRAVENOUS | Status: DC
  Filled 2018-07-14: qty 100

## 2018-07-14 MED ORDER — TRANEXAMIC ACID (OHS) PUMP PRIME SOLUTION
2.0000 mg/kg | INTRAVENOUS | Status: DC
  Filled 2018-07-14: qty 1.93

## 2018-07-14 MED ORDER — SODIUM CHLORIDE 0.9 % IV SOLN
750.0000 mg | INTRAVENOUS | Status: DC
  Filled 2018-07-14: qty 750

## 2018-07-14 MED ORDER — PHENYLEPHRINE HCL-NACL 20-0.9 MG/250ML-% IV SOLN
30.0000 ug/min | INTRAVENOUS | Status: DC
  Filled 2018-07-14: qty 250

## 2018-07-14 MED ORDER — NOREPINEPHRINE 4 MG/250ML-% IV SOLN
0.0000 ug/min | INTRAVENOUS | Status: DC
  Filled 2018-07-14: qty 250

## 2018-07-14 MED ORDER — MILRINONE LACTATE IN DEXTROSE 20-5 MG/100ML-% IV SOLN
0.3000 ug/kg/min | INTRAVENOUS | Status: DC
  Filled 2018-07-14: qty 100

## 2018-07-14 MED ORDER — SODIUM CHLORIDE 0.9 % IV SOLN
INTRAVENOUS | Status: DC
  Filled 2018-07-14: qty 30

## 2018-07-14 MED ORDER — DOPAMINE-DEXTROSE 3.2-5 MG/ML-% IV SOLN
0.0000 ug/kg/min | INTRAVENOUS | Status: DC
  Filled 2018-07-14: qty 250

## 2018-07-14 MED ORDER — TRANEXAMIC ACID (OHS) BOLUS VIA INFUSION
15.0000 mg/kg | INTRAVENOUS | Status: DC
  Filled 2018-07-14: qty 1446

## 2018-07-14 MED ORDER — PLASMA-LYTE 148 IV SOLN
INTRAVENOUS | Status: DC
  Filled 2018-07-14: qty 2.5

## 2018-07-14 MED ORDER — POTASSIUM CHLORIDE 2 MEQ/ML IV SOLN
80.0000 meq | INTRAVENOUS | Status: DC
  Filled 2018-07-14: qty 40

## 2018-07-14 MED ORDER — TRANEXAMIC ACID 1000 MG/10ML IV SOLN
1.5000 mg/kg/h | INTRAVENOUS | Status: DC
  Filled 2018-07-14: qty 25

## 2018-07-14 NOTE — Telephone Encounter (Signed)
I called patient to cancel scheduled CABG srgy with Dr. Donata Clay on Tuesday 3/24 related to COVID-19 outbreak throughout the community.  He completely understands and he knows to call our office or Dr. Okey Dupre if any concerns come up between now and then.  We plan to see him back in the office in several weeks to hopefully r/s surgery soon.  Patient has no further questions.

## 2018-07-15 ENCOUNTER — Inpatient Hospital Stay (HOSPITAL_COMMUNITY): Admission: RE | Admit: 2018-07-15 | Payer: Medicare Other | Source: Home / Self Care | Admitting: Cardiothoracic Surgery

## 2018-07-15 ENCOUNTER — Encounter (HOSPITAL_COMMUNITY): Admission: RE | Payer: Self-pay | Source: Home / Self Care

## 2018-07-15 SURGERY — CORONARY ARTERY BYPASS GRAFTING (CABG)
Anesthesia: General | Site: Chest

## 2018-08-06 ENCOUNTER — Telehealth: Payer: Medicare Other | Admitting: Cardiothoracic Surgery

## 2018-08-06 ENCOUNTER — Telehealth: Payer: Self-pay | Admitting: Cardiothoracic Surgery

## 2018-08-06 NOTE — Telephone Encounter (Signed)
  I am in the  [facility] office and completed a patient visit by telephone with [Jerry Myers] on August 06, 2018  ]. I verified that I was speaking with the correct person using two identifiers.Consent for the telephone visit was obtained from the patient who agreed to proceed.  The patient is a 68 year old obese diabetic with severe three-vessel coronary artery disease and stable angina.  Ejection fraction is mild to moderately reduced and he has moderate central mitral regurgitation.  He is undergone previous cardiac catheterization, CT scan of the chest, and hemoglobin A1c[ 9.0]  Because he has stable angina his CABG x3 procedure has been on hold until the hospital policy for elective surgery is lifted.  He understands the reason for the delay in scheduling his operation.  He denies any change in his anginal pattern which is mainly shortness of breath with exertion.  No resting symptoms.  No prolonged symptoms after he stops walking.  Still on his diabetic diet taking his regular meds.  The patient will be notified a few weeks before surgery scheduled.  He will need a face-to-face office visit prior to the operation to make sure he is still a candidate for surgery and at that time medication changes will be completed as needed- such as his metformin dosing and anticoagulation dosing.

## 2018-08-08 ENCOUNTER — Telehealth: Payer: Self-pay

## 2018-08-08 NOTE — Telephone Encounter (Signed)
Called patient to schedule for one month f/u (recall) .

## 2018-08-26 ENCOUNTER — Other Ambulatory Visit: Payer: Self-pay

## 2018-08-26 ENCOUNTER — Ambulatory Visit (INDEPENDENT_AMBULATORY_CARE_PROVIDER_SITE_OTHER): Payer: Medicare Other | Admitting: Cardiothoracic Surgery

## 2018-08-26 ENCOUNTER — Encounter: Payer: Self-pay | Admitting: Cardiothoracic Surgery

## 2018-08-26 VITALS — BP 144/78 | HR 72 | Temp 97.7°F | Resp 16 | Ht 68.0 in | Wt 210.0 lb

## 2018-08-26 DIAGNOSIS — I25118 Atherosclerotic heart disease of native coronary artery with other forms of angina pectoris: Secondary | ICD-10-CM

## 2018-08-26 DIAGNOSIS — E1169 Type 2 diabetes mellitus with other specified complication: Secondary | ICD-10-CM | POA: Diagnosis not present

## 2018-08-26 MED ORDER — METOPROLOL TARTRATE 25 MG PO TABS: 12.5000 mg | ORAL_TABLET | Freq: Once | ORAL | 0 refills | Status: DC

## 2018-08-26 MED ORDER — DOXYCYCLINE HYCLATE 100 MG PO TABS: 100.0000 mg | ORAL_TABLET | Freq: Two times a day (BID) | ORAL | 0 refills | Status: DC

## 2018-08-26 NOTE — Progress Notes (Signed)
PCP is Mikael SprayMcRae, Lauren, NP Referring Provider is End, Cristal Deerhristopher, MD  Chief Complaint  Patient presents with  . 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  Patient symptoms are dyspnea with exertion.  He was evaluated by Dr. Tomie Chinaevankar and Myoview stress test was positive for ischemia.  He subsequently underwent cardiac catheterization via right radial artery showing chronic occlusion of the RCA which fills faintly by collaterals, occlusion of the circumflex with small disease branches and moderate but diffuse coronary disease of the LAD measured by FFR to be hemodynamically significant.  LVEDP was 14.  Because of his diabetes, LV dysfunction, and symptoms is recommended for CABG. echocardiogram showed mild-moderate central mitral   Insufficiency with EF 50%.  Normal RV function.   Patient lives with alone after the death of his wife 2 years ago.  He is able to drive and care for himself although he has difficulty with reading, medications, and has home health nurse assistance.  He states he does do some yard work but in the winter is very sedentary in his activities.   Over 10 years ago the patient had a injury at work insisting of right shoulder injury, puncture to his chest with pneumothorax and chest tube, and left foot fracture.     The patient has never smoked.  He used to drink alcohol heavily but has not had alcohol in over 10 years.  He has poor diet, cooks for himself and eats a lot of junk food he admits   The patient denies any resting symptoms or  Patient is in a difficult situation with moderate LV dysfunction, obesity, and severe three-vessel coronary disease in a diabetic pattern with suboptimal targets.  CABG would  probably be his best long-term therapy however before scheduling the patient for surgery he will need assessment of hemoglobin A1c  diabetic control, liver function, and chest CT to assess multiple pulmonary nodularity. He will return the office after these studies are completed to possibly schedule for surgery. Kerin PernaPeter Van Trigt III, MD  Follow-up information  Patient has  undergone pulmonary function testing and chest x-ray which are satisfactory.  Pre-CABG Doppler showed no significant carotid disease.  His hemoglobin A1c is 9.5 but his blood sugars the past 2 weeks have been under 200.  The patient denies chest pain fever cough or signs of COVID virus.  No contacts or family members have been ill.  Patient sustained a probable tick bite in his left pretibial leg with erythema and cellulitis.  This was anesthetized and debrided and a Neosporin dressing applied.  Patient be given a course of oral doxycycline.  Since it has been 4 months since his echocardiogram this will be repeated when he returns for his preop visit.  Patient is not taking a beta-blocker.  He is provided a prescription for 1 dose of metoprolol to be taken on the morning of surgery with a sip of water. Past Medical History:  Diagnosis Date  . Anxiety   . Cardiomyopathy (HCC) 04/19/2015   Ejection fraction 4045% in the fall of 2016  . Coronary artery disease of native artery of native heart with stable angina pectoris (HCC) 03/18/2015  . Dyspnea   . Essential hypertension 12/29/2014  . Type 2 diabetes mellitus without complication (  HCC) 12/29/2014  . 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 CARDIAC CATHETERIZATION    . FOOT SURGERY    . LEFT HEART CATH AND CORONARY ANGIOGRAPHY N/A 06/05/2018   Procedure: LEFT HEART CATH AND CORONARY ANGIOGRAPHY;  Surgeon: Yvonne Kendall, MD;  [facility];  Service: Cardiovascular;  Laterality: N/A;  . LUNG SURGERY       History reviewed. No pertinent family history.  Social History Social History   Tobacco Use  . Smoking status: Never Smoker  . Smokeless tobacco: Never Used  Substance Use Topics  . Alcohol use: Never    Frequency: Never  . Drug use: Not on file    Current Outpatient Medications  Medication Sig Dispense Refill  . amLODipine (NORVASC) 5 MG tablet Take 5 mg by mouth daily.    Marland Kitchen aspirin EC 81 MG tablet Take 81 mg by mouth daily.    Marland Kitchen atorvastatin (LIPITOR) 20 MG tablet Take 20 mg by mouth daily.    Marland Kitchen gabapentin (NEURONTIN) 300 MG capsule Take 300 mg by mouth 2 (two) times daily.    Marland Kitchen glimepiride (AMARYL) 1 MG tablet Take 1 mg by mouth daily with breakfast.    . glipiZIDE (GLUCOTROL XL) 5 MG 24 hr tablet Take 5 mg by mouth daily with breakfast.    . hydrochlorothiazide (HYDRODIURIL) 25 MG tablet Take 25 mg by mouth daily.    Marland Kitchen lisinopril (PRINIVIL,ZESTRIL) 20 MG tablet Take 20 mg by mouth daily.    . meloxicam (MOBIC) 7.5 MG tablet Take 7.5 mg by mouth daily.    . metFORMIN (GLUCOPHAGE) 500 MG tablet Take 1 tablet (500 mg total) by mouth 2 (two) times daily.    . nitroGLYCERIN (NITROSTAT) 0.4 MG SL tablet Place 1 tablet (0.4 mg total) under the tongue every 5 (five) minutes as needed for chest pain. 30 tablet prn  . sitaGLIPtin (JANUVIA) 100 MG tablet Take 100 mg by mouth daily.    Melene Muller ON 08/27/2018] doxycycline (VIBRA-TABS) 100 MG tablet Take 1 tablet (100 mg total) by mouth 2 (two) times daily for 5 days. 10 tablet 0  . [START ON 09/05/2018] metoprolol tartrate (LOPRESSOR) 25 MG tablet Take 0.5 tablets (12.5 mg total) by mouth once for 1 dose. Take morning of heart surgery with sip H20 1 tablet 0   No current facility-administered medications for this visit.     Not on File  Review of Systems  No Change since his last visit-consultation  BP (!) 144/78 (BP Location: Left Arm, Patient Position: Sitting, Cuff Size: Large)   Pulse 72   Temp 97.7 F (36.5 C) (Skin)   Resp  16   Ht 5\' 8"  (1.727 m)   Wt 210 lb (95.3 kg)   SpO2 97% Comment: RA  BMI 31.93 kg/m  Physical Exam      Exam    General- alert and comfortable    Neck- no JVD, no cervical adenopathy palpable, no carotid bruit   Lungs- clear without rales, wheezes   Cor- regular rate and rhythm, no murmur , gallop   Abdomen- soft, non-tender   Extremities - warm, non-tender, minimal edema.  Ulcerated wound in left pretibial area with eschar and surrounding cellulitis   Neuro- oriented, appropriate, no focal weakness   Diagnostic Tests: Severe three-vessel coronary disease, total occlusion RCA, total occlusion of the circumflex, 80% stenosis of the distal LAD. Patient has positive stress test for ischemia.  Impression: Patient  has urgent need for surgical revascularization due to severe three-vessel coronary disease, diabetes, and moderate reduction in LV function.  It is not safe to wait for surgery any further now that the current COVID viral patient load has been removed from [facility].  The patient understands that he will probably not have family visitors but he can communicate with family after surgery.  I have told the patient's family/sister I will call them daily reports on his condition.  Plan: Admit for multivessel CABG on May 15 at [facility]. Preoperative assessment on May 12 at short stay.  Patient will need repeat echocardiogram prior to surgery since it is been almost 4 months since his last study.  Mikey Bussing, MD [facility] [phone]

## 2018-08-27 ENCOUNTER — Encounter: Payer: Self-pay | Admitting: *Deleted

## 2018-08-27 ENCOUNTER — Other Ambulatory Visit: Payer: Self-pay | Admitting: *Deleted

## 2018-08-27 DIAGNOSIS — I2510 Atherosclerotic heart disease of native coronary artery without angina pectoris: Secondary | ICD-10-CM

## 2018-09-02 ENCOUNTER — Other Ambulatory Visit (HOSPITAL_COMMUNITY): Payer: Medicare Other

## 2018-09-03 ENCOUNTER — Other Ambulatory Visit: Payer: Self-pay

## 2018-09-03 ENCOUNTER — Other Ambulatory Visit (HOSPITAL_COMMUNITY)
Admission: RE | Admit: 2018-09-03 | Discharge: 2018-09-03 | Disposition: A | Discharge status: Home / Self Care | Payer: Medicare Other | Source: Ambulatory Visit | Attending: Cardiothoracic Surgery | Admitting: Cardiothoracic Surgery

## 2018-09-03 ENCOUNTER — Ambulatory Visit (HOSPITAL_COMMUNITY)
Admission: RE | Admit: 2018-09-03 | Discharge: 2018-09-03 | Disposition: A | Discharge status: Home / Self Care | Payer: Medicare Other | Source: Ambulatory Visit | Attending: Cardiothoracic Surgery | Admitting: Cardiothoracic Surgery

## 2018-09-03 ENCOUNTER — Ambulatory Visit (HOSPITAL_BASED_OUTPATIENT_CLINIC_OR_DEPARTMENT_OTHER)
Admission: RE | Admit: 2018-09-03 | Discharge: 2018-09-03 | Disposition: A | Discharge status: Home / Self Care | Payer: Medicare Other | Source: Ambulatory Visit | Attending: Cardiothoracic Surgery | Admitting: Cardiothoracic Surgery

## 2018-09-03 ENCOUNTER — Encounter (HOSPITAL_COMMUNITY): Payer: Self-pay

## 2018-09-03 ENCOUNTER — Encounter (HOSPITAL_COMMUNITY)
Admission: RE | Admit: 2018-09-03 | Discharge: 2018-09-03 | Disposition: A | Discharge status: Home / Self Care | Payer: Medicare Other | Source: Ambulatory Visit | Attending: Cardiothoracic Surgery | Admitting: Cardiothoracic Surgery

## 2018-09-03 DIAGNOSIS — I2510 Atherosclerotic heart disease of native coronary artery without angina pectoris: Secondary | ICD-10-CM

## 2018-09-03 DIAGNOSIS — F329 Major depressive disorder, single episode, unspecified: Secondary | ICD-10-CM | POA: Insufficient documentation

## 2018-09-03 DIAGNOSIS — I429 Cardiomyopathy, unspecified: Secondary | ICD-10-CM | POA: Insufficient documentation

## 2018-09-03 DIAGNOSIS — Z7982 Long term (current) use of aspirin: Secondary | ICD-10-CM | POA: Insufficient documentation

## 2018-09-03 DIAGNOSIS — Z7984 Long term (current) use of oral hypoglycemic drugs: Secondary | ICD-10-CM | POA: Insufficient documentation

## 2018-09-03 DIAGNOSIS — Z87442 Personal history of urinary calculi: Secondary | ICD-10-CM | POA: Insufficient documentation

## 2018-09-03 DIAGNOSIS — Z79899 Other long term (current) drug therapy: Secondary | ICD-10-CM | POA: Insufficient documentation

## 2018-09-03 DIAGNOSIS — M1990 Unspecified osteoarthritis, unspecified site: Secondary | ICD-10-CM | POA: Insufficient documentation

## 2018-09-03 DIAGNOSIS — E119 Type 2 diabetes mellitus without complications: Secondary | ICD-10-CM | POA: Insufficient documentation

## 2018-09-03 DIAGNOSIS — Z01818 Encounter for other preprocedural examination: Secondary | ICD-10-CM | POA: Insufficient documentation

## 2018-09-03 DIAGNOSIS — K219 Gastro-esophageal reflux disease without esophagitis: Secondary | ICD-10-CM | POA: Insufficient documentation

## 2018-09-03 DIAGNOSIS — Z1159 Encounter for screening for other viral diseases: Secondary | ICD-10-CM | POA: Insufficient documentation

## 2018-09-03 DIAGNOSIS — F419 Anxiety disorder, unspecified: Secondary | ICD-10-CM | POA: Insufficient documentation

## 2018-09-03 HISTORY — DX: Personal history of urinary calculi: Z87.442

## 2018-09-03 HISTORY — DX: Gastro-esophageal reflux disease without esophagitis: K21.9

## 2018-09-03 HISTORY — DX: Depression, unspecified: F32.A

## 2018-09-03 HISTORY — DX: Unspecified osteoarthritis, unspecified site: M19.90

## 2018-09-03 HISTORY — DX: Atherosclerotic heart disease of native coronary artery without angina pectoris: I25.10

## 2018-09-03 HISTORY — DX: Unspecified cataract: H26.9

## 2018-09-03 LAB — COMPREHENSIVE METABOLIC PANEL
ALT: 33 U/L (ref 0–44)
AST: 19 U/L (ref 15–41)
Albumin: 3.8 g/dL (ref 3.5–5.0)
Alkaline Phosphatase: 62 U/L (ref 38–126)
Anion gap: 12 (ref 5–15)
BUN: 18 mg/dL (ref 8–23)
CO2: 20 mmol/L — ABNORMAL LOW (ref 22–32)
Calcium: 9.5 mg/dL (ref 8.9–10.3)
Chloride: 107 mmol/L (ref 98–111)
Creatinine, Ser: 1.1 mg/dL (ref 0.61–1.24)
GFR calc Af Amer: >60 mL/min (ref >60)
GFR calc non Af Amer: >60 mL/min (ref >60)
Glucose, Bld: 206 mg/dL — ABNORMAL HIGH (ref 70–99)
Potassium: 3.8 mmol/L (ref 3.5–5.1)
Sodium: 139 mmol/L (ref 135–145)
Total Bilirubin: 0.4 mg/dL (ref 0.3–1.2)
Total Protein: 6.9 g/dL (ref 6.5–8.1)

## 2018-09-03 LAB — URINALYSIS, ROUTINE W REFLEX MICROSCOPIC
Bilirubin Urine: NEGATIVE
Glucose, UA: >=500 mg/dL — AB
Hgb urine dipstick: NEGATIVE
Ketones, ur: NEGATIVE mg/dL
Leukocytes,Ua: NEGATIVE
Nitrite: NEGATIVE
Protein, ur: NEGATIVE mg/dL
Specific Gravity, Urine: 1.01 (ref 1.005–1.030)
pH: 5 (ref 5.0–8.0)

## 2018-09-03 LAB — CBC
HCT: 41.5 % (ref 39.0–52.0)
Hemoglobin: 14 g/dL (ref 13.0–17.0)
MCH: 30.3 pg (ref 26.0–34.0)
MCHC: 33.7 g/dL (ref 30.0–36.0)
MCV: 89.8 fL (ref 80.0–100.0)
Platelets: 234 10*3/uL (ref 150–400)
RBC: 4.62 MIL/uL (ref 4.22–5.81)
RDW: 13.5 % (ref 11.5–15.5)
WBC: 7.2 10*3/uL (ref 4.0–10.5)
nRBC: 0 % (ref 0.0–0.2)

## 2018-09-03 LAB — URINALYSIS, MICROSCOPIC (REFLEX)
Bacteria, UA: NONE SEEN
RBC / HPF: NONE SEEN RBC/hpf (ref 0–5)

## 2018-09-03 LAB — TYPE AND SCREEN
ABO/RH(D): A POS
Antibody Screen: NEGATIVE

## 2018-09-03 LAB — BLOOD GAS, ARTERIAL
Acid-base deficit: 0.1 mmol/L (ref 0.0–2.0)
Bicarbonate: 23.7 mmol/L (ref 20.0–28.0)
Drawn by: 421801
FIO2: 21
O2 Saturation: 97.4 %
Patient temperature: 98.6
pCO2 arterial: 36.3 mmHg (ref 32.0–48.0)
pH, Arterial: 7.43 (ref 7.350–7.450)
pO2, Arterial: 94 mmHg (ref 83.0–108.0)

## 2018-09-03 LAB — ECHOCARDIOGRAM COMPLETE
Height: 68 in
Weight: 3392 oz

## 2018-09-03 LAB — HEMOGLOBIN A1C
Hgb A1c MFr Bld: 9.2 % — ABNORMAL HIGH (ref 4.8–5.6)
Mean Plasma Glucose: 217.34 mg/dL

## 2018-09-03 LAB — SURGICAL PCR SCREEN
MRSA, PCR: NEGATIVE
Staphylococcus aureus: NEGATIVE

## 2018-09-03 LAB — PROTIME-INR
INR: 1 (ref 0.8–1.2)
Prothrombin Time: 13.4 seconds (ref 11.4–15.2)

## 2018-09-03 LAB — GLUCOSE, CAPILLARY: Glucose-Capillary: 205 mg/dL — ABNORMAL HIGH (ref 70–99)

## 2018-09-03 LAB — SARS CORONAVIRUS 2 BY RT PCR (HOSPITAL ORDER, PERFORMED IN ~~LOC~~ HOSPITAL LAB): SARS Coronavirus 2: NEGATIVE

## 2018-09-03 LAB — APTT: aPTT: 27 seconds (ref 24–36)

## 2018-09-03 NOTE — Pre-Procedure Instructions (Addendum)
Meta HatchetWilliam A Leibensperger  09/03/2018      [facility] - [place], [place] - [address] [place] [address] Phone: [phone] Fax: [phone]    Your procedure is scheduled on May 15  [facility] at 7:53 A.M.  Call this number if you have problems the morning of surgery:  [phone]   Remember:  Do not eat or drink after midnight.      Take these medicines the morning of surgery with A SIP OF WATER :              Amlodipine (norvasc)             escitalopram (lexapro)             Gabapentin (neurontin)             Metoprolol (lopressor)             Nitroglycerine if needed             7 days prior to surgery STOP taking any Aspirin (unless otherwise instructed by your surgeon), Aleve, Naproxen, Ibuprofen, Motrin, Advil, Goody's, BC's, all herbal medications, fish oil, and all vitamins.          Follow your surgeon's instructions on when to stop Aspirin.  If no instructions were given by your surgeon then you will need to call the office to get those instructions.                    How to Manage Your Diabetes Before and After Surgery  Why is it important to control my blood sugar before and after surgery? . Improving blood sugar levels before and after surgery helps healing and can limit problems. . A way of improving blood sugar control is eating a healthy diet by: o  Eating less sugar and carbohydrates o  Increasing activity/exercise o  Talking with your doctor about reaching your blood sugar goals . High blood sugars (greater than 180 mg/dL) can raise your risk of infections and slow your recovery, so you will need to focus on controlling your diabetes during the weeks before surgery. . Make sure that the doctor who takes care of your diabetes knows about your planned surgery including the date and location.  How do I manage my blood sugar before surgery? . Check your blood sugar at least 4 times a day, starting 2 days  before surgery, to make sure that the level is not too high or low. o Check your blood sugar the morning of your surgery when you wake up and every 2 hours until you get to the Short Stay unit. . If your blood sugar is less than 70 mg/dL, you will need to treat for low blood sugar: o Do not take insulin. o Treat a low blood sugar (less than 70 mg/dL) with  cup of clear juice (cranberry or apple), 4 glucose tablets, OR glucose gel. Recheck blood sugar in 15 minutes after treatment (to make sure it is greater than 70 mg/dL). If your blood sugar is not greater than 70 mg/dL on recheck, call [phone] o  for further instructions. . Report your blood sugar to the short stay nurse when you get to Short Stay.  . If you are admitted to the hospital after surgery: o Your blood sugar will be checked by the staff and you will probably be given insulin after surgery (instead of oral diabetes medicines) to make  sure you have good blood sugar levels. o The goal for blood sugar control after surgery is 80-180 mg/dL.          WHAT DO I DO ABOUT MY DIABETES MEDICATION?   Marland Kitchen Do not take oral diabetes medicines (pills) the morning of surgery.               Do not wear jewelry.  Do not wear lotions, powders, or perfumes, or deodorant.  Do not shave 48 hours prior to surgery.  Men may shave face and neck.  Do not bring valuables to the hospital.  [facility] is not responsible for any belongings or valuables.  Contacts, dentures or bridgework may not be worn into surgery.  Leave your suitcase in the car.  After surgery it may be brought to your room.  For patients admitted to the hospital, discharge time will be determined by your treatment team.  Patients discharged the day of surgery will not be allowed to drive home.    Special instructions:   [facility]- Preparing For Surgery  Before surgery, you can play an important role. Because skin is not sterile, your skin needs to be as free of  germs as possible. You can reduce the number of germs on your skin by washing with CHG (chlorahexidine gluconate) Soap before surgery.  CHG is an antiseptic cleaner which kills germs and bonds with the skin to continue killing germs even after washing.    Oral Hygiene is also important to reduce your risk of infection.  Remember - BRUSH YOUR TEETH THE MORNING OF SURGERY WITH YOUR REGULAR TOOTHPASTE  Please do not use if you have an allergy to CHG or antibacterial soaps. If your skin becomes reddened/irritated stop using the CHG.  Do not shave (including legs and underarms) for at least 48 hours prior to first CHG shower. It is OK to shave your face.  Please follow these instructions carefully.   1. Shower the NIGHT BEFORE SURGERY and the MORNING OF SURGERY with CHG.   2. If you chose to wash your hair, wash your hair first as usual with your normal shampoo.  3. After you shampoo, rinse your hair and body thoroughly to remove the shampoo.  4. Use CHG as you would any other liquid soap. You can apply CHG directly to the skin and wash gently with a scrungie or a clean washcloth.   5. Apply the CHG Soap to your body ONLY FROM THE NECK DOWN.  Do not use on open wounds or open sores. Avoid contact with your eyes, ears, mouth and genitals (private parts). Wash Face and genitals (private parts)  with your normal soap.  6. Wash thoroughly, paying special attention to the area where your surgery will be performed.  7. Thoroughly rinse your body with warm water from the neck down.  8. DO NOT shower/wash with your normal soap after using and rinsing off the CHG Soap.  9. Pat yourself dry with a CLEAN TOWEL.  10. Wear CLEAN PAJAMAS to bed the night before surgery, wear comfortable clothes the morning of surgery  11. Place CLEAN SHEETS on your bed the night of your first shower and DO NOT SLEEP WITH PETS.    Day of Surgery:  Do not apply any deodorants/lotions.  Please wear clean clothes to the  hospital/surgery center.   Remember to brush your teeth WITH YOUR REGULAR TOOTHPASTE.    Please read over the following fact sheets that you were given. Coughing and  Deep Breathing, MRSA Information and Surgical Site Infection Prevention

## 2018-09-03 NOTE — Progress Notes (Signed)
Anesthesia Chart Review:  Case:  [id number] Date/Time:  09/05/18 0938   Procedures:      CORONARY ARTERY BYPASS GRAFTING (CABG) (N/A Chest)     TRANSESOPHAGEAL ECHOCARDIOGRAM (TEE) (N/A )   Anesthesia type:  General   Pre-op diagnosis:  CAD   [facility] / [facility]   Surgeon:  Kerin PernaVan Trigt, Peter, MD      DISCUSSION: Patient is a 68 year old male scheduled for the above procedure. Surgery initially scheduled for 07/15/18, but postponed due to COVID 19 pandemic.   History includes never smoker, CAD, cardiomyopathy (diagnosed ~ 2016), ventricular extrasystoles, HTN, DM2, dyspnea, GERD, right pneumothorax (traumatic, > 10 years ago).   A1c 9.2 (down from 9.5 on 07/09/18, which was noted by Dr. Donata ClayVan Trigt). Preoperative COVID test is negative.  Echo on 09/03/18 showed EF down to 30-35% from 50-55% in 04/2018. Dr. Donata ClayVan Trigt to review for any additional recommendations.     VS: BP (!) 154/76   Pulse (!) 53   Temp 36.7 C   Resp 18   Ht 5\' 8"  (1.727 m)   Wt 96.2 kg   SpO2 98%   BMI 32.23 kg/m     PROVIDERS: Mikael SprayMcRae, Lauren, NP is PCP [facility] Belva Cromeevankar, Rajan, MD is cardiologist   LABS: Preoperative labs noted. (all labs ordered are listed, but only abnormal results are displayed)  Labs Reviewed  GLUCOSE, CAPILLARY - Abnormal; Notable for the following components:      Result Value   Glucose-Capillary 205 (*)    All other components within normal limits  COMPREHENSIVE METABOLIC PANEL - Abnormal; Notable for the following components:   CO2 20 (*)    Glucose, Bld 206 (*)    All other components within normal limits  HEMOGLOBIN A1C - Abnormal; Notable for the following components:   Hgb A1c MFr Bld 9.2 (*)    All other components within normal limits  URINALYSIS, ROUTINE W REFLEX MICROSCOPIC - Abnormal; Notable for the following components:   Glucose, UA >=500 (*)    All other components within normal limits  SURGICAL PCR SCREEN  APTT  BLOOD GAS, ARTERIAL   CBC  PROTIME-INR  URINALYSIS, MICROSCOPIC (REFLEX)  TYPE AND SCREEN    PFTs 07/09/18: PRE: FVC 3.31 (80%). FEV1 2.74 (91%). POST: FVC 3.27 (79%). FEV1 2.82 (93%). DLCO unc 22.23 (91%).   IMAGES: CXR 09/03/18:  IMPRESSION: No active cardiopulmonary disease.  CT Chest 06/24/18: IMPRESSION: 1. No acute findings.  No evidence of pneumonia or pulmonary edema. 2. No lung masses or pulmonary nodules. 3. Mild dependent subsegmental atelectasis. Mild atelectasis or scarring at the anterior base of the right middle lobe. 4. Mild cardiomegaly with a left ventricular predominance. Three-vessel coronary artery calcifications. Aortic atherosclerosis.   EKG: 09/03/18: Ectopic atrial rhythm with premature atrial complexes and with with premature ventricular or aberrantly conducted complexes Non-specific intra-ventricular conduction block Cannot rule out Septal infarct , age undetermined No significant change since 18-Mar Confirmed by Thurmon FairCroitoru, Mihai [phone]([id number]) on 09/03/2018 12:28:39 PM   CV: Echo 09/03/18: IMPRESSIONS  1. The left ventricle has moderate-severely reduced systolic function, with an ejection fraction of 30-35%. Anterolateral, inferolateral, inferior severe hypokinesis. The cavity size was mildly dilated. There is moderately increased left ventricular  wall thickness. Left ventricular diastolic Doppler parameters are consistent with impaired relaxation.  2. The right ventricle has normal systolic function. The cavity was normal. There is no increase in right ventricular wall thickness.  3. Left atrial size  was mildly dilated.  4. Right atrial size was mildly dilated.  5. Mitral valve regurgitation is mild to moderate by color flow Doppler. No evidence of mitral valve stenosis.  6. The aortic valve is tricuspid. Mild calcification of the aortic valve. No stenosis of the aortic valve.  7. There is mild dilatation of the aortic root measuring 38 mm.  8. Normal IVC size. PA systolic  pressure 26 mmHg. (Comparison 05/09/18: LVEF 50-55%, normal wall motion)  Carotid US 07/09/18: Summary: - Right Carotid: Velocities in the right ICA are consistent with a 1-39% stenosis. - Left Carotid: Velocities in the left ICA are consistent with a 1-39% stenosis. Vertebrals: Bilateral vertebral arteries demonstrate antegrade flow.  Cardiac cath 06/05/18 (for abnormal stress test 05/28/18 showing ischemia, prior infarct, EF 36%): Conclusions: 1. Significant three-vessel coronary artery disease, including diffuse LAD disease up to 70% (DFR of distal LAD is 0.71; cut-point for hemodynamic significance is 0.89), calcified 90% mid LCx stenosis as well as chronic occlusions of several OM branches, and chronic total occlusion of mid RCA with bridging and left-to-right collaterals. 2. Normal left ventricular filling pressure. Recommendations: 1. Outpatient cardiac surgery consultation.  Though chest pain is atypical, he may not experience typical angina in the setting of diabetes mellitus.  He would benefit from CABG based on his coronary anatomy on history of diabetes mellitus. 2. Aggressive secondary prevention.  48 Hour Holter monitor 05/09/18: Conclusion:  Abnormal Holter monitoring.  Patient had brief atrial runs, longest was 14 beats and nonsustained ventricular tachycardia rarely, longest was 4 beat.   Past Medical History:  Diagnosis Date  . Anxiety   . Arthritis   . Cardiomyopathy (HCC) 04/19/2015   Ejection fraction 4045% in the fall of 2016  . Cataract    right eye  . Coronary artery disease   . Coronary artery disease of native artery of native heart with stable angina pectoris (HCC) 03/18/2015  . Depression   . Dyspnea   . Essential hypertension 12/29/2014  . GERD (gastroesophageal reflux disease)   . History of kidney stones    20 yrs. ago  . Type 2 diabetes mellitus without complication (HCC) 12/29/2014  . 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 CARDIAC CATHETERIZATION    . FOOT SURGERY    . LEFT HEART CATH AND CORONARY ANGIOGRAPHY N/A 06/05/2018   Procedure: LEFT HEART CATH AND CORONARY ANGIOGRAPHY;  Surgeon: Yvonne Kendall, MD;  [facility];  Service: Cardiovascular;  Laterality: N/A;  . LUNG SURGERY      MEDICATIONS: . amLODipine (NORVASC) 5 MG tablet  . aspirin EC 81 MG tablet  . atorvastatin (LIPITOR) 20 MG tablet  . doxycycline (VIBRA-TABS) 100 MG tablet  . escitalopram (LEXAPRO) 5 MG tablet  . gabapentin (NEURONTIN) 300 MG capsule  . glimepiride (AMARYL) 1 MG tablet  . glipiZIDE (GLUCOTROL XL) 5 MG 24 hr tablet  . hydrochlorothiazide (HYDRODIURIL) 25 MG tablet  . lisinopril (PRINIVIL,ZESTRIL) 20 MG tablet  . meloxicam (MOBIC) 7.5 MG tablet  . metFORMIN (GLUCOPHAGE) 500 MG tablet  . [START ON 09/05/2018] metoprolol tartrate (LOPRESSOR) 25 MG tablet  . nitroGLYCERIN (NITROSTAT) 0.4 MG SL tablet  . sitaGLIPtin (JANUVIA) 100 MG tablet   No current facility-administered medications for this encounter.     Shonna Chock, PA-C Surgical Short Stay/Anesthesiology [facility] Phone [phone] [facility] Phone [phone] 09/03/2018 5:20 PM

## 2018-09-03 NOTE — Progress Notes (Signed)
PCP - Albertha Ghee A [facility] Cardiologist - Revankar  Chest x-ray - today EKG - today Stress Test - 05-28-18 ECHO - today Cardiac Cath - 06-05-18  Sleep Study - na CPAP -   Fasting Blood Sugar - 150 Checks Blood Sugar ___1__ times a day ( doesn't check on weekends)  Blood Thinner Instructions:na Aspirin Instructions: not to take day of surgery  Anesthesia review:   Patient denies shortness of breath, fever, cough and chest pain at PAT appointment   Patient verbalized understanding of instructions that were given to them at the PAT appointment. Patient was also instructed that they will need to review over the PAT instructions again at home before surgery.

## 2018-09-03 NOTE — Anesthesia Preprocedure Evaluation (Addendum)
Anesthesia Evaluation  Patient identified by MRN, date of birth, ID band Patient awake    Reviewed: Allergy & Precautions, NPO status , Patient's Chart, lab work & pertinent test results  Airway Mallampati: II  TM Distance: >3 FB Neck ROM: Full    Dental  (+) Edentulous Upper, Edentulous Lower   Pulmonary    breath sounds clear to auscultation       Cardiovascular hypertension,  Rhythm:Regular Rate:Normal     Neuro/Psych    GI/Hepatic   Endo/Other  diabetes  Renal/GU      Musculoskeletal   Abdominal   Peds  Hematology   Anesthesia Other Findings   Reproductive/Obstetrics                            Anesthesia Physical Anesthesia Plan  ASA: III  Anesthesia Plan: General   Post-op Pain Management:    Induction: Intravenous  PONV Risk Score and Plan: Ondansetron  Airway Management Planned: Oral ETT  Additional Equipment: Arterial line, Ultrasound Guidance Line Placement, 3D TEE and PA Cath  Intra-op Plan:   Post-operative Plan: Post-operative intubation/ventilation  Informed Consent: I have reviewed the patients History and Physical, chart, labs and discussed the procedure including the risks, benefits and alternatives for the proposed anesthesia with the patient or authorized representative who has indicated his/her understanding and acceptance.     Dental advisory given  Plan Discussed with: CRNA and Anesthesiologist  Anesthesia Plan Comments: (PAT note written 09/03/2018 by Shonna Chock, PA-C. )       Anesthesia Quick Evaluation

## 2018-09-03 NOTE — Progress Notes (Signed)
2D Echocardiogram has been performed.  Jerry Myers 09/03/2018, 1:37 PM

## 2018-09-04 MED ORDER — NITROGLYCERIN IN D5W 200-5 MCG/ML-% IV SOLN
2.0000 ug/min | INTRAVENOUS | Status: DC
  Filled 2018-09-04: qty 250

## 2018-09-04 MED ORDER — DEXMEDETOMIDINE HCL IN NACL 400 MCG/100ML IV SOLN
0.1000 ug/kg/h | INTRAVENOUS | Status: AC
  Administered 2018-09-05: 09:00:00 0.4 ug/kg/h via INTRAVENOUS
  Filled 2018-09-04: qty 100

## 2018-09-04 MED ORDER — NOREPINEPHRINE BITARTRATE 1 MG/ML IV SOLN
0.0000 ug/min | INTRAVENOUS | Status: AC
  Administered 2018-09-05: 2 ug/min via INTRAVENOUS
  Filled 2018-09-04: qty 4

## 2018-09-04 MED ORDER — SODIUM CHLORIDE 0.9 % IV SOLN
INTRAVENOUS | Status: DC
  Filled 2018-09-04: qty 30

## 2018-09-04 MED ORDER — PHENYLEPHRINE HCL-NACL 20-0.9 MG/250ML-% IV SOLN
30.0000 ug/min | INTRAVENOUS | Status: AC
  Administered 2018-09-05: 09:00:00 40 ug/min via INTRAVENOUS
  Filled 2018-09-04: qty 250

## 2018-09-04 MED ORDER — MILRINONE LACTATE IN DEXTROSE 20-5 MG/100ML-% IV SOLN
0.3000 ug/kg/min | INTRAVENOUS | Status: AC
  Administered 2018-09-05: 13:00:00 0.25 ug/kg/min via INTRAVENOUS
  Filled 2018-09-04: qty 100

## 2018-09-04 MED ORDER — DOPAMINE-DEXTROSE 3.2-5 MG/ML-% IV SOLN
0.0000 ug/kg/min | INTRAVENOUS | Status: DC
  Filled 2018-09-04: qty 250

## 2018-09-04 MED ORDER — VANCOMYCIN HCL 10 G IV SOLR
1500.0000 mg | INTRAVENOUS | Status: AC
  Administered 2018-09-05: 09:00:00 1500 mg via INTRAVENOUS
  Filled 2018-09-04: qty 1500

## 2018-09-04 MED ORDER — MAGNESIUM SULFATE 50 % IJ SOLN
40.0000 meq | INTRAMUSCULAR | Status: DC
  Filled 2018-09-04: qty 9.85

## 2018-09-04 MED ORDER — INSULIN REGULAR(HUMAN) IN NACL 100-0.9 UT/100ML-% IV SOLN
INTRAVENOUS | Status: AC
  Administered 2018-09-05: 1 [IU]/h via INTRAVENOUS
  Filled 2018-09-04: qty 100

## 2018-09-04 MED ORDER — TRANEXAMIC ACID (OHS) PUMP PRIME SOLUTION
2.0000 mg/kg | INTRAVENOUS | Status: DC
  Filled 2018-09-04: qty 1.92

## 2018-09-04 MED ORDER — SODIUM CHLORIDE 0.9 % IV SOLN
1.5000 g | INTRAVENOUS | Status: AC
  Administered 2018-09-05: 1.5 g via INTRAVENOUS
  Filled 2018-09-04: qty 1.5

## 2018-09-04 MED ORDER — VANCOMYCIN HCL 10 G IV SOLR
1250.0000 mg | INTRAVENOUS | Status: DC
  Filled 2018-09-04: qty 1250

## 2018-09-04 MED ORDER — EPINEPHRINE PF 1 MG/ML IJ SOLN
0.0000 ug/min | INTRAVENOUS | Status: DC
  Filled 2018-09-04: qty 4

## 2018-09-04 MED ORDER — POTASSIUM CHLORIDE 2 MEQ/ML IV SOLN
80.0000 meq | INTRAVENOUS | Status: DC
  Filled 2018-09-04: qty 40

## 2018-09-04 MED ORDER — TRANEXAMIC ACID 1000 MG/10ML IV SOLN
1.5000 mg/kg/h | INTRAVENOUS | Status: AC
  Administered 2018-09-05: 1.5 mg/kg/h via INTRAVENOUS
  Filled 2018-09-04: qty 25

## 2018-09-04 MED ORDER — SODIUM CHLORIDE 0.9 % IV SOLN
750.0000 mg | INTRAVENOUS | Status: AC
  Administered 2018-09-05: 14:00:00 750 mg via INTRAVENOUS
  Filled 2018-09-04: qty 750

## 2018-09-04 MED ORDER — PLASMA-LYTE 148 IV SOLN
INTRAVENOUS | Status: DC
  Filled 2018-09-04: qty 2.5

## 2018-09-04 MED ORDER — TRANEXAMIC ACID (OHS) BOLUS VIA INFUSION
15.0000 mg/kg | INTRAVENOUS | Status: AC
  Administered 2018-09-05: 09:00:00 1443 mg via INTRAVENOUS
  Filled 2018-09-04: qty 1443

## 2018-09-05 ENCOUNTER — Inpatient Hospital Stay (HOSPITAL_COMMUNITY): Payer: Medicare Other | Admitting: Certified Registered Nurse Anesthetist

## 2018-09-05 ENCOUNTER — Inpatient Hospital Stay (HOSPITAL_COMMUNITY): Payer: Medicare Other

## 2018-09-05 ENCOUNTER — Inpatient Hospital Stay (HOSPITAL_COMMUNITY): Payer: Medicare Other | Admitting: Vascular Surgery

## 2018-09-05 ENCOUNTER — Inpatient Hospital Stay (HOSPITAL_COMMUNITY)
Admission: RE | Admit: 2018-09-05 | Discharge: 2018-09-12 | DRG: 236 | Disposition: A | Discharge status: Home Health | Payer: Medicare Other | Attending: Cardiothoracic Surgery | Admitting: Cardiothoracic Surgery

## 2018-09-05 ENCOUNTER — Other Ambulatory Visit: Payer: Self-pay

## 2018-09-05 ENCOUNTER — Encounter (HOSPITAL_COMMUNITY)
Admission: RE | Disposition: A | Discharge status: Home Health | Payer: Self-pay | Source: Home / Self Care | Attending: Cardiothoracic Surgery

## 2018-09-05 ENCOUNTER — Encounter (HOSPITAL_COMMUNITY): Payer: Self-pay | Admitting: Anesthesiology

## 2018-09-05 DIAGNOSIS — I2510 Atherosclerotic heart disease of native coronary artery without angina pectoris: Secondary | ICD-10-CM | POA: Diagnosis present

## 2018-09-05 DIAGNOSIS — R001 Bradycardia, unspecified: Secondary | ICD-10-CM | POA: Diagnosis not present

## 2018-09-05 DIAGNOSIS — K219 Gastro-esophageal reflux disease without esophagitis: Secondary | ICD-10-CM | POA: Diagnosis present

## 2018-09-05 DIAGNOSIS — I10 Essential (primary) hypertension: Secondary | ICD-10-CM | POA: Diagnosis present

## 2018-09-05 DIAGNOSIS — I472 Ventricular tachycardia: Secondary | ICD-10-CM | POA: Diagnosis not present

## 2018-09-05 DIAGNOSIS — Y92230 Patient room in hospital as the place of occurrence of the external cause: Secondary | ICD-10-CM | POA: Diagnosis present

## 2018-09-05 DIAGNOSIS — D62 Acute posthemorrhagic anemia: Secondary | ICD-10-CM | POA: Diagnosis not present

## 2018-09-05 DIAGNOSIS — Z6832 Body mass index (BMI) 32.0-32.9, adult: Secondary | ICD-10-CM

## 2018-09-05 DIAGNOSIS — Z09 Encounter for follow-up examination after completed treatment for conditions other than malignant neoplasm: Secondary | ICD-10-CM

## 2018-09-05 DIAGNOSIS — E6609 Other obesity due to excess calories: Secondary | ICD-10-CM | POA: Diagnosis present

## 2018-09-05 DIAGNOSIS — E119 Type 2 diabetes mellitus without complications: Secondary | ICD-10-CM | POA: Diagnosis present

## 2018-09-05 DIAGNOSIS — Z1159 Encounter for screening for other viral diseases: Secondary | ICD-10-CM | POA: Diagnosis not present

## 2018-09-05 DIAGNOSIS — I4892 Unspecified atrial flutter: Secondary | ICD-10-CM | POA: Diagnosis not present

## 2018-09-05 DIAGNOSIS — I255 Ischemic cardiomyopathy: Secondary | ICD-10-CM | POA: Diagnosis present

## 2018-09-05 DIAGNOSIS — I493 Ventricular premature depolarization: Secondary | ICD-10-CM | POA: Diagnosis not present

## 2018-09-05 DIAGNOSIS — T462X5A Adverse effect of other antidysrhythmic drugs, initial encounter: Secondary | ICD-10-CM | POA: Diagnosis not present

## 2018-09-05 DIAGNOSIS — I25119 Atherosclerotic heart disease of native coronary artery with unspecified angina pectoris: Secondary | ICD-10-CM | POA: Diagnosis present

## 2018-09-05 DIAGNOSIS — Z419 Encounter for procedure for purposes other than remedying health state, unspecified: Secondary | ICD-10-CM

## 2018-09-05 DIAGNOSIS — Z951 Presence of aortocoronary bypass graft: Secondary | ICD-10-CM

## 2018-09-05 DIAGNOSIS — I2582 Chronic total occlusion of coronary artery: Secondary | ICD-10-CM | POA: Diagnosis present

## 2018-09-05 DIAGNOSIS — Z8249 Family history of ischemic heart disease and other diseases of the circulatory system: Secondary | ICD-10-CM | POA: Diagnosis not present

## 2018-09-05 DIAGNOSIS — E8779 Other fluid overload: Secondary | ICD-10-CM | POA: Diagnosis not present

## 2018-09-05 DIAGNOSIS — E876 Hypokalemia: Secondary | ICD-10-CM | POA: Diagnosis not present

## 2018-09-05 HISTORY — DX: Presence of aortocoronary bypass graft: Z95.1

## 2018-09-05 HISTORY — PX: TEE WITHOUT CARDIOVERSION: SHX5443

## 2018-09-05 HISTORY — PX: CORONARY ARTERY BYPASS GRAFT: SHX141

## 2018-09-05 LAB — POCT I-STAT 7, (LYTES, BLD GAS, ICA,H+H)
Acid-base deficit: 4 mmol/L — ABNORMAL HIGH (ref 0.0–2.0)
Acid-base deficit: 3 mmol/L — ABNORMAL HIGH (ref 0.0–2.0)
Acid-base deficit: 5 mmol/L — ABNORMAL HIGH (ref 0.0–2.0)
Acid-base deficit: 3 mmol/L — ABNORMAL HIGH (ref 0.0–2.0)
Bicarbonate: 26 mmol/L (ref 20.0–28.0)
Bicarbonate: 21.9 mmol/L (ref 20.0–28.0)
Bicarbonate: 22.4 mmol/L (ref 20.0–28.0)
Bicarbonate: 20.6 mmol/L (ref 20.0–28.0)
Bicarbonate: 21 mmol/L (ref 20.0–28.0)
Calcium, Ion: 1.08 mmol/L — ABNORMAL LOW (ref 1.15–1.40)
Calcium, Ion: 1.05 mmol/L — ABNORMAL LOW (ref 1.15–1.40)
Calcium, Ion: 1.06 mmol/L — ABNORMAL LOW (ref 1.15–1.40)
Calcium, Ion: 1.1 mmol/L — ABNORMAL LOW (ref 1.15–1.40)
Calcium, Ion: 1.05 mmol/L — ABNORMAL LOW (ref 1.15–1.40)
HCT: 27 % — ABNORMAL LOW (ref 39.0–52.0)
HCT: 26 % — ABNORMAL LOW (ref 39.0–52.0)
HCT: 27 % — ABNORMAL LOW (ref 39.0–52.0)
HCT: 26 % — ABNORMAL LOW (ref 39.0–52.0)
HCT: 25 % — ABNORMAL LOW (ref 39.0–52.0)
Hemoglobin: 9.2 g/dL — ABNORMAL LOW (ref 13.0–17.0)
Hemoglobin: 8.8 g/dL — ABNORMAL LOW (ref 13.0–17.0)
Hemoglobin: 9.2 g/dL — ABNORMAL LOW (ref 13.0–17.0)
Hemoglobin: 8.8 g/dL — ABNORMAL LOW (ref 13.0–17.0)
Hemoglobin: 8.5 g/dL — ABNORMAL LOW (ref 13.0–17.0)
O2 Saturation: 100 %
O2 Saturation: 95 %
O2 Saturation: 95 %
O2 Saturation: 90 %
O2 Saturation: 92 %
Patient temperature: 36.3
Patient temperature: 36.6
Patient temperature: 37
Patient temperature: 37
Potassium: 4.3 mmol/L (ref 3.5–5.1)
Potassium: 3.6 mmol/L (ref 3.5–5.1)
Potassium: 4.3 mmol/L (ref 3.5–5.1)
Potassium: 4 mmol/L (ref 3.5–5.1)
Potassium: 3.8 mmol/L (ref 3.5–5.1)
Sodium: 144 mmol/L (ref 135–145)
Sodium: 145 mmol/L (ref 135–145)
Sodium: 145 mmol/L (ref 135–145)
Sodium: 143 mmol/L (ref 135–145)
Sodium: 142 mmol/L (ref 135–145)
TCO2: 27 mmol/L (ref 22–32)
TCO2: 23 mmol/L (ref 22–32)
TCO2: 24 mmol/L (ref 22–32)
TCO2: 22 mmol/L (ref 22–32)
TCO2: 22 mmol/L (ref 22–32)
pCO2 arterial: 45.9 mmHg (ref 32.0–48.0)
pCO2 arterial: 39.8 mmHg (ref 32.0–48.0)
pCO2 arterial: 39.7 mmHg (ref 32.0–48.0)
pCO2 arterial: 38.1 mmHg (ref 32.0–48.0)
pCO2 arterial: 33.8 mmHg (ref 32.0–48.0)
pH, Arterial: 7.361 (ref 7.350–7.450)
pH, Arterial: 7.345 — ABNORMAL LOW (ref 7.350–7.450)
pH, Arterial: 7.358 (ref 7.350–7.450)
pH, Arterial: 7.341 — ABNORMAL LOW (ref 7.350–7.450)
pH, Arterial: 7.401 (ref 7.350–7.450)
pO2, Arterial: 353 mmHg — ABNORMAL HIGH (ref 83.0–108.0)
pO2, Arterial: 79 mmHg — ABNORMAL LOW (ref 83.0–108.0)
pO2, Arterial: 80 mmHg — ABNORMAL LOW (ref 83.0–108.0)
pO2, Arterial: 62 mmHg — ABNORMAL LOW (ref 83.0–108.0)
pO2, Arterial: 63 mmHg — ABNORMAL LOW (ref 83.0–108.0)

## 2018-09-05 LAB — CREATININE, SERUM
Creatinine, Ser: 1.11 mg/dL (ref 0.61–1.24)
GFR calc Af Amer: >60 mL/min (ref >60)
GFR calc non Af Amer: >60 mL/min (ref >60)

## 2018-09-05 LAB — POCT I-STAT 4, (NA,K, GLUC, HGB,HCT)
Glucose, Bld: 245 mg/dL — ABNORMAL HIGH (ref 70–99)
Glucose, Bld: 210 mg/dL — ABNORMAL HIGH (ref 70–99)
Glucose, Bld: 184 mg/dL — ABNORMAL HIGH (ref 70–99)
Glucose, Bld: 185 mg/dL — ABNORMAL HIGH (ref 70–99)
Glucose, Bld: 182 mg/dL — ABNORMAL HIGH (ref 70–99)
Glucose, Bld: 135 mg/dL — ABNORMAL HIGH (ref 70–99)
HCT: 35 % — ABNORMAL LOW (ref 39.0–52.0)
HCT: 33 % — ABNORMAL LOW (ref 39.0–52.0)
HCT: 27 % — ABNORMAL LOW (ref 39.0–52.0)
HCT: 26 % — ABNORMAL LOW (ref 39.0–52.0)
HCT: 28 % — ABNORMAL LOW (ref 39.0–52.0)
HCT: 27 % — ABNORMAL LOW (ref 39.0–52.0)
Hemoglobin: 11.9 g/dL — ABNORMAL LOW (ref 13.0–17.0)
Hemoglobin: 11.2 g/dL — ABNORMAL LOW (ref 13.0–17.0)
Hemoglobin: 9.2 g/dL — ABNORMAL LOW (ref 13.0–17.0)
Hemoglobin: 8.8 g/dL — ABNORMAL LOW (ref 13.0–17.0)
Hemoglobin: 9.5 g/dL — ABNORMAL LOW (ref 13.0–17.0)
Hemoglobin: 9.2 g/dL — ABNORMAL LOW (ref 13.0–17.0)
Potassium: 4 mmol/L (ref 3.5–5.1)
Potassium: 4.1 mmol/L (ref 3.5–5.1)
Potassium: 3.9 mmol/L (ref 3.5–5.1)
Potassium: 4.5 mmol/L (ref 3.5–5.1)
Potassium: 3.6 mmol/L (ref 3.5–5.1)
Potassium: 3.9 mmol/L (ref 3.5–5.1)
Sodium: 140 mmol/L (ref 135–145)
Sodium: 142 mmol/L (ref 135–145)
Sodium: 140 mmol/L (ref 135–145)
Sodium: 140 mmol/L (ref 135–145)
Sodium: 142 mmol/L (ref 135–145)
Sodium: 143 mmol/L (ref 135–145)

## 2018-09-05 LAB — CBC
HCT: 29.4 % — ABNORMAL LOW (ref 39.0–52.0)
HCT: 28.7 % — ABNORMAL LOW (ref 39.0–52.0)
Hemoglobin: 9.9 g/dL — ABNORMAL LOW (ref 13.0–17.0)
Hemoglobin: 9.8 g/dL — ABNORMAL LOW (ref 13.0–17.0)
MCH: 30.5 pg (ref 26.0–34.0)
MCH: 30.5 pg (ref 26.0–34.0)
MCHC: 33.7 g/dL (ref 30.0–36.0)
MCHC: 34.1 g/dL (ref 30.0–36.0)
MCV: 90.5 fL (ref 80.0–100.0)
MCV: 89.4 fL (ref 80.0–100.0)
Platelets: 155 10*3/uL (ref 150–400)
Platelets: 171 10*3/uL (ref 150–400)
RBC: 3.25 MIL/uL — ABNORMAL LOW (ref 4.22–5.81)
RBC: 3.21 MIL/uL — ABNORMAL LOW (ref 4.22–5.81)
RDW: 13.4 % (ref 11.5–15.5)
RDW: 13.3 % (ref 11.5–15.5)
WBC: 12.8 10*3/uL — ABNORMAL HIGH (ref 4.0–10.5)
WBC: 13.3 10*3/uL — ABNORMAL HIGH (ref 4.0–10.5)
nRBC: 0 % (ref 0.0–0.2)
nRBC: 0 % (ref 0.0–0.2)

## 2018-09-05 LAB — APTT: aPTT: 27 seconds (ref 24–36)

## 2018-09-05 LAB — PROTIME-INR
INR: 1.4 — ABNORMAL HIGH (ref 0.8–1.2)
Prothrombin Time: 17.4 seconds — ABNORMAL HIGH (ref 11.4–15.2)

## 2018-09-05 LAB — HEMOGLOBIN AND HEMATOCRIT, BLOOD
HCT: 28.2 % — ABNORMAL LOW (ref 39.0–52.0)
Hemoglobin: 9.5 g/dL — ABNORMAL LOW (ref 13.0–17.0)

## 2018-09-05 LAB — PLATELET COUNT: Platelets: 139 10*3/uL — ABNORMAL LOW (ref 150–400)

## 2018-09-05 LAB — ECHO INTRAOPERATIVE TEE
Height: 68 in
Weight: 3392.02 oz

## 2018-09-05 LAB — MAGNESIUM: Magnesium: 2.7 mg/dL — ABNORMAL HIGH (ref 1.7–2.4)

## 2018-09-05 LAB — GLUCOSE, CAPILLARY: Glucose-Capillary: 238 mg/dL — ABNORMAL HIGH (ref 70–99)

## 2018-09-05 SURGERY — CORONARY ARTERY BYPASS GRAFTING (CABG)
Anesthesia: General | Site: Chest

## 2018-09-05 MED ORDER — PROPOFOL 10 MG/ML IV BOLUS
INTRAVENOUS | Status: AC
  Filled 2018-09-05: qty 20

## 2018-09-05 MED ORDER — LACTATED RINGERS IV SOLN
INTRAVENOUS | Status: DC | PRN
  Administered 2018-09-05: 08:00:00 via INTRAVENOUS

## 2018-09-05 MED ORDER — ALBUMIN HUMAN 5 % IV SOLN
INTRAVENOUS | Status: DC | PRN
  Administered 2018-09-05 (×2): via INTRAVENOUS

## 2018-09-05 MED ORDER — MIDAZOLAM HCL 2 MG/2ML IJ SOLN
INTRAMUSCULAR | Status: AC
  Filled 2018-09-05: qty 2

## 2018-09-05 MED ORDER — CHLORHEXIDINE GLUCONATE 0.12 % MT SOLN
15.0000 mL | OROMUCOSAL | Status: AC
  Administered 2018-09-05: 16:00:00 15 mL via OROMUCOSAL

## 2018-09-05 MED ORDER — PLASMA-LYTE 148 IV SOLN
INTRAVENOUS | Status: AC | PRN
  Administered 2018-09-05: 500 mL via INTRAVENOUS

## 2018-09-05 MED ORDER — DEXAMETHASONE SODIUM PHOSPHATE 10 MG/ML IJ SOLN
INTRAMUSCULAR | Status: AC
  Filled 2018-09-05: qty 1

## 2018-09-05 MED ORDER — ROCURONIUM BROMIDE 10 MG/ML (PF) SYRINGE
PREFILLED_SYRINGE | INTRAVENOUS | Status: DC | PRN
  Administered 2018-09-05 (×3): 50 mg via INTRAVENOUS

## 2018-09-05 MED ORDER — MIDAZOLAM HCL (PF) 10 MG/2ML IJ SOLN
INTRAMUSCULAR | Status: AC
  Filled 2018-09-05: qty 2

## 2018-09-05 MED ORDER — INSULIN REGULAR(HUMAN) IN NACL 100-0.9 UT/100ML-% IV SOLN
INTRAVENOUS | Status: DC
  Administered 2018-09-05: 22:00:00 5.3 [IU]/h via INTRAVENOUS
  Filled 2018-09-05: qty 100

## 2018-09-05 MED ORDER — LACTATED RINGERS IV SOLN
INTRAVENOUS | Status: DC
  Administered 2018-09-06: 08:00:00 via INTRAVENOUS

## 2018-09-05 MED ORDER — ROCURONIUM BROMIDE 10 MG/ML (PF) SYRINGE
PREFILLED_SYRINGE | INTRAVENOUS | Status: AC
  Filled 2018-09-05: qty 20

## 2018-09-05 MED ORDER — PROPOFOL 10 MG/ML IV BOLUS
INTRAVENOUS | Status: DC | PRN
  Administered 2018-09-05: 70 mg via INTRAVENOUS

## 2018-09-05 MED ORDER — DEXTROSE 5 % IV SOLN
30.0000 mg/h | INTRAVENOUS | Status: DC
  Administered 2018-09-06 (×2): 30 mg/h via INTRAVENOUS
  Filled 2018-09-05 (×3): qty 9

## 2018-09-05 MED ORDER — CALCIUM CHLORIDE 10 % IV SOLN
INTRAVENOUS | Status: AC
  Filled 2018-09-05: qty 10

## 2018-09-05 MED ORDER — LIDOCAINE 2% (20 MG/ML) 5 ML SYRINGE
INTRAMUSCULAR | Status: AC
  Filled 2018-09-05: qty 5

## 2018-09-05 MED ORDER — ROCURONIUM BROMIDE 10 MG/ML (PF) SYRINGE
PREFILLED_SYRINGE | INTRAVENOUS | Status: AC
  Filled 2018-09-05: qty 10

## 2018-09-05 MED ORDER — SODIUM CHLORIDE 0.9 % IV SOLN: 250.0000 mL | INTRAVENOUS | Status: DC

## 2018-09-05 MED ORDER — BISACODYL 5 MG PO TBEC
10.0000 mg | DELAYED_RELEASE_TABLET | Freq: Every day | ORAL | Status: DC
  Administered 2018-09-06 – 2018-09-12 (×6): 10 mg via ORAL
  Filled 2018-09-05 (×6): qty 2

## 2018-09-05 MED ORDER — METOPROLOL TARTRATE 25 MG/10 ML ORAL SUSPENSION: 12.5000 mg | Freq: Two times a day (BID) | ORAL | Status: DC

## 2018-09-05 MED ORDER — ALBUMIN HUMAN 5 % IV SOLN
250.0000 mL | INTRAVENOUS | Status: AC | PRN
  Administered 2018-09-05 (×3): 12.5 g via INTRAVENOUS
  Filled 2018-09-05 (×2): qty 250

## 2018-09-05 MED ORDER — EPHEDRINE 5 MG/ML INJ
INTRAVENOUS | Status: AC
  Filled 2018-09-05: qty 10

## 2018-09-05 MED ORDER — CHLORHEXIDINE GLUCONATE 0.12 % MT SOLN
15.0000 mL | Freq: Once | OROMUCOSAL | Status: AC
  Administered 2018-09-05: 08:00:00 15 mL via OROMUCOSAL
  Filled 2018-09-05: qty 15

## 2018-09-05 MED ORDER — INSULIN REGULAR BOLUS VIA INFUSION
0.0000 [IU] | Freq: Three times a day (TID) | INTRAVENOUS | Status: DC
  Filled 2018-09-05: qty 10

## 2018-09-05 MED ORDER — METOPROLOL TARTRATE 12.5 MG HALF TABLET
12.5000 mg | ORAL_TABLET | Freq: Two times a day (BID) | ORAL | Status: DC
  Administered 2018-09-06 – 2018-09-08 (×5): 12.5 mg via ORAL
  Filled 2018-09-05 (×6): qty 1

## 2018-09-05 MED ORDER — CHLORHEXIDINE GLUCONATE 4 % EX LIQD: 30.0000 mL | CUTANEOUS | Status: DC

## 2018-09-05 MED ORDER — PHENYLEPHRINE HCL-NACL 20-0.9 MG/250ML-% IV SOLN: 0.0000 ug/min | INTRAVENOUS | Status: DC

## 2018-09-05 MED ORDER — NOREPINEPHRINE 4 MG/250ML-% IV SOLN
0.0000 ug/min | INTRAVENOUS | Status: DC
  Administered 2018-09-06: 10 ug/min via INTRAVENOUS
  Filled 2018-09-05: qty 250

## 2018-09-05 MED ORDER — HEPARIN SODIUM (PORCINE) 1000 UNIT/ML IJ SOLN
INTRAMUSCULAR | Status: DC | PRN
  Administered 2018-09-05: 2000 [IU] via INTRAVENOUS
  Administered 2018-09-05: 30000 [IU] via INTRAVENOUS

## 2018-09-05 MED ORDER — FENTANYL CITRATE (PF) 100 MCG/2ML IJ SOLN
INTRAMUSCULAR | Status: AC
  Filled 2018-09-05: qty 2

## 2018-09-05 MED ORDER — SODIUM CHLORIDE 0.9 % IV SOLN
INTRAVENOUS | Status: DC
  Administered 2018-09-06 – 2018-09-08 (×2): via INTRAVENOUS

## 2018-09-05 MED ORDER — MORPHINE SULFATE (PF) 2 MG/ML IV SOLN
1.0000 mg | INTRAVENOUS | Status: DC | PRN
  Administered 2018-09-05 – 2018-09-06 (×4): 2 mg via INTRAVENOUS
  Filled 2018-09-05 (×4): qty 1

## 2018-09-05 MED ORDER — PROTAMINE SULFATE 10 MG/ML IV SOLN
INTRAVENOUS | Status: DC | PRN
  Administered 2018-09-05: 300 mg via INTRAVENOUS

## 2018-09-05 MED ORDER — ASPIRIN EC 325 MG PO TBEC
325.0000 mg | DELAYED_RELEASE_TABLET | Freq: Every day | ORAL | Status: DC
  Administered 2018-09-06: 09:00:00 325 mg via ORAL
  Filled 2018-09-05: qty 1

## 2018-09-05 MED ORDER — SODIUM CHLORIDE (PF) 0.9 % IJ SOLN
OROMUCOSAL | Status: DC | PRN
  Administered 2018-09-05 (×3): 4 mL via TOPICAL

## 2018-09-05 MED ORDER — ACETAMINOPHEN 650 MG RE SUPP
650.0000 mg | Freq: Once | RECTAL | Status: AC
  Administered 2018-09-05: 16:00:00 650 mg via RECTAL

## 2018-09-05 MED ORDER — SODIUM CHLORIDE 0.9 % IV SOLN
1.5000 g | Freq: Two times a day (BID) | INTRAVENOUS | Status: AC
  Administered 2018-09-05 – 2018-09-07 (×4): 1.5 g via INTRAVENOUS
  Filled 2018-09-05 (×4): qty 1.5

## 2018-09-05 MED ORDER — DEXMEDETOMIDINE HCL IN NACL 200 MCG/50ML IV SOLN
INTRAVENOUS | Status: AC
  Filled 2018-09-05: qty 50

## 2018-09-05 MED ORDER — FENTANYL CITRATE (PF) 250 MCG/5ML IJ SOLN
INTRAMUSCULAR | Status: AC
  Filled 2018-09-05: qty 25

## 2018-09-05 MED ORDER — SODIUM CHLORIDE 0.9% FLUSH
3.0000 mL | Freq: Two times a day (BID) | INTRAVENOUS | Status: DC
  Administered 2018-09-06: 3 mL via INTRAVENOUS
  Administered 2018-09-06 – 2018-09-07 (×2): 6 mL via INTRAVENOUS
  Administered 2018-09-08: 10 mL via INTRAVENOUS
  Administered 2018-09-09: 3 mL via INTRAVENOUS

## 2018-09-05 MED ORDER — HEMOSTATIC AGENTS (NO CHARGE) OPTIME
TOPICAL | Status: DC | PRN
  Administered 2018-09-05: 1 via TOPICAL

## 2018-09-05 MED ORDER — DOCUSATE SODIUM 100 MG PO CAPS
200.0000 mg | ORAL_CAPSULE | Freq: Every day | ORAL | Status: DC
  Administered 2018-09-06 – 2018-09-09 (×3): 200 mg via ORAL
  Filled 2018-09-05 (×3): qty 2

## 2018-09-05 MED ORDER — MIDAZOLAM HCL 5 MG/5ML IJ SOLN
INTRAMUSCULAR | Status: DC | PRN
  Administered 2018-09-05 (×2): 1 mg via INTRAVENOUS
  Administered 2018-09-05: 5 mg via INTRAVENOUS
  Administered 2018-09-05: 2 mg via INTRAVENOUS
  Administered 2018-09-05: 1 mg via INTRAVENOUS

## 2018-09-05 MED ORDER — LACTATED RINGERS IV SOLN: 500.0000 mL | Freq: Once | INTRAVENOUS | Status: DC | PRN

## 2018-09-05 MED ORDER — ACETAMINOPHEN 160 MG/5ML PO SOLN
1000.0000 mg | Freq: Four times a day (QID) | ORAL | Status: AC
  Administered 2018-09-09 – 2018-09-10 (×3): 1000 mg
  Filled 2018-09-05 (×3): qty 40.6

## 2018-09-05 MED ORDER — 0.9 % SODIUM CHLORIDE (POUR BTL) OPTIME
TOPICAL | Status: DC | PRN
  Administered 2018-09-05: 5000 mL

## 2018-09-05 MED ORDER — LACTATED RINGERS IV SOLN: INTRAVENOUS | Status: DC

## 2018-09-05 MED ORDER — AMIODARONE LOAD VIA INFUSION
150.0000 mg | Freq: Once | INTRAVENOUS | Status: AC
  Administered 2018-09-05: 22:00:00 150 mg via INTRAVENOUS
  Filled 2018-09-05: qty 83.34

## 2018-09-05 MED ORDER — OXYCODONE HCL 5 MG PO TABS
5.0000 mg | ORAL_TABLET | ORAL | Status: DC | PRN
  Administered 2018-09-06 (×2): 10 mg via ORAL
  Administered 2018-09-06: 5 mg via ORAL
  Administered 2018-09-09 – 2018-09-10 (×2): 10 mg via ORAL
  Administered 2018-09-12: 5 mg via ORAL
  Filled 2018-09-05 (×4): qty 2
  Filled 2018-09-05: qty 1
  Filled 2018-09-05: qty 2

## 2018-09-05 MED ORDER — ASPIRIN 81 MG PO CHEW: 324.0000 mg | CHEWABLE_TABLET | Freq: Every day | ORAL | Status: DC

## 2018-09-05 MED ORDER — SODIUM CHLORIDE 0.9% FLUSH: 3.0000 mL | INTRAVENOUS | Status: DC | PRN

## 2018-09-05 MED ORDER — VANCOMYCIN HCL IN DEXTROSE 1-5 GM/200ML-% IV SOLN
1000.0000 mg | Freq: Once | INTRAVENOUS | Status: AC
  Administered 2018-09-05: 21:00:00 1000 mg via INTRAVENOUS
  Filled 2018-09-05: qty 200

## 2018-09-05 MED ORDER — BISACODYL 10 MG RE SUPP: 10.0000 mg | Freq: Every day | RECTAL | Status: DC

## 2018-09-05 MED ORDER — SODIUM CHLORIDE 0.45 % IV SOLN
INTRAVENOUS | Status: DC | PRN
  Administered 2018-09-05: 16:00:00 via INTRAVENOUS

## 2018-09-05 MED ORDER — METOPROLOL TARTRATE 12.5 MG HALF TABLET
12.5000 mg | ORAL_TABLET | Freq: Once | ORAL | Status: AC
  Administered 2018-09-05: 08:00:00 12.5 mg via ORAL
  Filled 2018-09-05: qty 1

## 2018-09-05 MED ORDER — CHLORHEXIDINE GLUCONATE 0.12% ORAL RINSE (MEDLINE KIT)
15.0000 mL | Freq: Two times a day (BID) | OROMUCOSAL | Status: DC
  Administered 2018-09-05 – 2018-09-08 (×8): 15 mL via OROMUCOSAL

## 2018-09-05 MED ORDER — FENTANYL CITRATE (PF) 100 MCG/2ML IJ SOLN: 100.0000 ug | Freq: Once | INTRAMUSCULAR | Status: DC

## 2018-09-05 MED ORDER — SUCCINYLCHOLINE CHLORIDE 200 MG/10ML IV SOSY
PREFILLED_SYRINGE | INTRAVENOUS | Status: AC
  Filled 2018-09-05: qty 10

## 2018-09-05 MED ORDER — POTASSIUM CHLORIDE 10 MEQ/50ML IV SOLN
10.0000 meq | INTRAVENOUS | Status: AC
  Administered 2018-09-05 (×3): 10 meq via INTRAVENOUS

## 2018-09-05 MED ORDER — FAMOTIDINE IN NACL 20-0.9 MG/50ML-% IV SOLN
20.0000 mg | Freq: Two times a day (BID) | INTRAVENOUS | Status: DC
  Administered 2018-09-05: 17:00:00 20 mg via INTRAVENOUS
  Filled 2018-09-05 (×2): qty 50

## 2018-09-05 MED ORDER — ONDANSETRON HCL 4 MG/2ML IJ SOLN
4.0000 mg | Freq: Four times a day (QID) | INTRAMUSCULAR | Status: DC | PRN
  Administered 2018-09-09 – 2018-09-10 (×2): 4 mg via INTRAVENOUS
  Filled 2018-09-05 (×2): qty 2

## 2018-09-05 MED ORDER — PHENYLEPHRINE 40 MCG/ML (10ML) SYRINGE FOR IV PUSH (FOR BLOOD PRESSURE SUPPORT)
PREFILLED_SYRINGE | INTRAVENOUS | Status: AC
  Filled 2018-09-05: qty 10

## 2018-09-05 MED ORDER — ORAL CARE MOUTH RINSE
15.0000 mL | OROMUCOSAL | Status: DC
  Administered 2018-09-05: 18:00:00 15 mL via OROMUCOSAL

## 2018-09-05 MED ORDER — DEXMEDETOMIDINE HCL IN NACL 200 MCG/50ML IV SOLN: 0.0000 ug/kg/h | INTRAVENOUS | Status: DC

## 2018-09-05 MED ORDER — FENTANYL CITRATE (PF) 250 MCG/5ML IJ SOLN
INTRAMUSCULAR | Status: DC | PRN
  Administered 2018-09-05: 150 ug via INTRAVENOUS
  Administered 2018-09-05: 100 ug via INTRAVENOUS
  Administered 2018-09-05 (×2): 50 ug via INTRAVENOUS
  Administered 2018-09-05: 150 ug via INTRAVENOUS
  Administered 2018-09-05: 50 ug via INTRAVENOUS
  Administered 2018-09-05 (×3): 150 ug via INTRAVENOUS
  Administered 2018-09-05 (×2): 50 ug via INTRAVENOUS
  Administered 2018-09-05: 150 ug via INTRAVENOUS

## 2018-09-05 MED ORDER — ORAL CARE MOUTH RINSE
15.0000 mL | Freq: Two times a day (BID) | OROMUCOSAL | Status: DC
  Administered 2018-09-05 – 2018-09-09 (×8): 15 mL via OROMUCOSAL

## 2018-09-05 MED ORDER — MAGNESIUM SULFATE 4 GM/100ML IV SOLN
4.0000 g | Freq: Once | INTRAVENOUS | Status: AC
  Administered 2018-09-05: 16:00:00 4 g via INTRAVENOUS
  Filled 2018-09-05: qty 100

## 2018-09-05 MED ORDER — MIDAZOLAM HCL 2 MG/2ML IJ SOLN: 2.0000 mg | INTRAMUSCULAR | Status: DC | PRN

## 2018-09-05 MED ORDER — NITROGLYCERIN IN D5W 200-5 MCG/ML-% IV SOLN: 0.0000 ug/min | INTRAVENOUS | Status: DC

## 2018-09-05 MED ORDER — TRAMADOL HCL 50 MG PO TABS
50.0000 mg | ORAL_TABLET | ORAL | Status: DC | PRN
  Administered 2018-09-08 – 2018-09-09 (×2): 50 mg via ORAL
  Administered 2018-09-09: 100 mg via ORAL
  Filled 2018-09-05: qty 1
  Filled 2018-09-05: qty 2
  Filled 2018-09-05: qty 1

## 2018-09-05 MED ORDER — LIDOCAINE 2% (20 MG/ML) 5 ML SYRINGE
INTRAMUSCULAR | Status: DC | PRN
  Administered 2018-09-05: 40 mg via INTRAVENOUS

## 2018-09-05 MED ORDER — METOPROLOL TARTRATE 5 MG/5ML IV SOLN: 2.5000 mg | INTRAVENOUS | Status: DC | PRN

## 2018-09-05 MED ORDER — MIDAZOLAM HCL 2 MG/2ML IJ SOLN: 2.0000 mg | Freq: Once | INTRAMUSCULAR | Status: DC

## 2018-09-05 MED ORDER — ACETAMINOPHEN 160 MG/5ML PO SOLN: 650.0000 mg | Freq: Once | ORAL | Status: AC

## 2018-09-05 MED ORDER — ACETAMINOPHEN 500 MG PO TABS
1000.0000 mg | ORAL_TABLET | Freq: Four times a day (QID) | ORAL | Status: AC
  Administered 2018-09-06 – 2018-09-10 (×14): 1000 mg via ORAL
  Filled 2018-09-05 (×13): qty 2

## 2018-09-05 MED ORDER — PANTOPRAZOLE SODIUM 40 MG PO TBEC
40.0000 mg | DELAYED_RELEASE_TABLET | Freq: Every day | ORAL | Status: DC
  Administered 2018-09-07 – 2018-09-12 (×6): 40 mg via ORAL
  Filled 2018-09-05 (×6): qty 1

## 2018-09-05 MED ORDER — MILRINONE LACTATE IN DEXTROSE 20-5 MG/100ML-% IV SOLN
0.1250 ug/kg/min | INTRAVENOUS | Status: DC
  Administered 2018-09-05 – 2018-09-07 (×3): 0.25 ug/kg/min via INTRAVENOUS
  Administered 2018-09-08: 0.125 ug/kg/min via INTRAVENOUS
  Filled 2018-09-05 (×3): qty 100

## 2018-09-05 MED ORDER — LACTATED RINGERS IV SOLN
INTRAVENOUS | Status: DC
  Administered 2018-09-05: 08:00:00 via INTRAVENOUS

## 2018-09-05 MED ORDER — DEXTROSE 5 % IV SOLN
60.0000 mg/h | INTRAVENOUS | Status: AC
  Filled 2018-09-05 (×2): qty 9

## 2018-09-05 SURGICAL SUPPLY — 98 items
ADAPTER CARDIO PERF ANTE/RETRO (ADAPTER) ×4 IMPLANT
BAG DECANTER FOR FLEXI CONT (MISCELLANEOUS) ×4 IMPLANT
BANDAGE ELASTIC 4 VELCRO ST LF (GAUZE/BANDAGES/DRESSINGS) ×4 IMPLANT
BANDAGE ELASTIC 6 VELCRO ST LF (GAUZE/BANDAGES/DRESSINGS) ×4 IMPLANT
BASKET HEART  (ORDER IN 25'S) (MISCELLANEOUS) ×1
BASKET HEART (ORDER IN 25'S) (MISCELLANEOUS) ×1
BASKET HEART (ORDER IN 25S) (MISCELLANEOUS) ×2 IMPLANT
BLADE CLIPPER SURG (BLADE) IMPLANT
BLADE STERNUM SYSTEM 6 (BLADE) ×4 IMPLANT
BLADE SURG 12 STRL SS (BLADE) ×4 IMPLANT
BNDG GAUZE ELAST 4 BULKY (GAUZE/BANDAGES/DRESSINGS) ×6 IMPLANT
CANISTER SUCT 3000ML PPV (MISCELLANEOUS) ×4 IMPLANT
CANNULA GUNDRY RCSP 15FR (MISCELLANEOUS) ×4 IMPLANT
CATH CPB KIT VANTRIGT (MISCELLANEOUS) ×4 IMPLANT
CATH ROBINSON RED A/P 18FR (CATHETERS) ×12 IMPLANT
CATH THORACIC 36FR RT ANG (CATHETERS) ×4 IMPLANT
CLIP FOGARTY SPRING 6M (CLIP) ×2 IMPLANT
CLIP RETRACTION 3.0MM CORONARY (MISCELLANEOUS) ×2 IMPLANT
CLIP VESOCCLUDE SM WIDE 24/CT (CLIP) ×4 IMPLANT
COVER WAND RF STERILE (DRAPES) ×4 IMPLANT
CRADLE DONUT ADULT HEAD (MISCELLANEOUS) ×4 IMPLANT
DRAIN CHANNEL 32F RND 10.7 FF (WOUND CARE) ×4 IMPLANT
DRAPE CARDIOVASCULAR INCISE (DRAPES) ×2
DRAPE SLUSH/WARMER DISC (DRAPES) ×4 IMPLANT
DRAPE SRG 135X102X78XABS (DRAPES) ×2 IMPLANT
DRSG AQUACEL AG ADV 3.5X14 (GAUZE/BANDAGES/DRESSINGS) ×4 IMPLANT
ELECT BLADE 4.0 EZ CLEAN MEGAD (MISCELLANEOUS) ×4
ELECT BLADE 6.5 EXT (BLADE) ×4 IMPLANT
ELECT CAUTERY BLADE 6.4 (BLADE) ×4 IMPLANT
ELECT REM PT RETURN 9FT ADLT (ELECTROSURGICAL) ×8
ELECTRODE BLDE 4.0 EZ CLN MEGD (MISCELLANEOUS) ×2 IMPLANT
ELECTRODE REM PT RTRN 9FT ADLT (ELECTROSURGICAL) ×4 IMPLANT
FELT TEFLON 1X6 (MISCELLANEOUS) ×6 IMPLANT
GAUZE SPONGE 4X4 12PLY STRL (GAUZE/BANDAGES/DRESSINGS) ×6 IMPLANT
GAUZE SPONGE 4X4 12PLY STRL LF (GAUZE/BANDAGES/DRESSINGS) ×4 IMPLANT
GLOVE BIO SURGEON STRL SZ7.5 (GLOVE) ×12 IMPLANT
GLOVE BIOGEL M STER SZ 6 (GLOVE) ×4 IMPLANT
GLOVE BIOGEL M STRL SZ7.5 (GLOVE) ×6 IMPLANT
GLOVE BIOGEL PI IND STRL 6 (GLOVE) IMPLANT
GLOVE BIOGEL PI IND STRL 7.0 (GLOVE) IMPLANT
GLOVE BIOGEL PI INDICATOR 6 (GLOVE) ×8
GLOVE BIOGEL PI INDICATOR 7.0 (GLOVE) ×8
GOWN STRL REUS W/ TWL LRG LVL3 (GOWN DISPOSABLE) ×8 IMPLANT
GOWN STRL REUS W/TWL LRG LVL3 (GOWN DISPOSABLE) ×20
HEMOSTAT POWDER SURGIFOAM 1G (HEMOSTASIS) ×12 IMPLANT
HEMOSTAT SURGICEL 2X14 (HEMOSTASIS) ×4 IMPLANT
INSERT FOGARTY XLG (MISCELLANEOUS) IMPLANT
KIT BASIN OR (CUSTOM PROCEDURE TRAY) ×4 IMPLANT
KIT SUCTION CATH 14FR (SUCTIONS) ×4 IMPLANT
KIT TURNOVER KIT B (KITS) ×4 IMPLANT
KIT VASOVIEW HEMOPRO 2 VH 4000 (KITS) ×4 IMPLANT
LEAD PACING MYOCARDI (MISCELLANEOUS) ×4 IMPLANT
MARKER GRAFT CORONARY BYPASS (MISCELLANEOUS) ×12 IMPLANT
NS IRRIG 1000ML POUR BTL (IV SOLUTION) ×20 IMPLANT
PACK E OPEN HEART (SUTURE) ×4 IMPLANT
PACK OPEN HEART (CUSTOM PROCEDURE TRAY) ×4 IMPLANT
PAD ARMBOARD 7.5X6 YLW CONV (MISCELLANEOUS) ×8 IMPLANT
PAD ELECT DEFIB RADIOL ZOLL (MISCELLANEOUS) ×4 IMPLANT
PENCIL BUTTON HOLSTER BLD 10FT (ELECTRODE) ×4 IMPLANT
PUNCH AORTIC ROTATE 4.0MM (MISCELLANEOUS) IMPLANT
PUNCH AORTIC ROTATE 4.5MM 8IN (MISCELLANEOUS) IMPLANT
PUNCH AORTIC ROTATE 5MM 8IN (MISCELLANEOUS) IMPLANT
SET CARDIOPLEGIA MPS 5001102 (MISCELLANEOUS) ×2 IMPLANT
SPONGE LAP 18X18 X RAY DECT (DISPOSABLE) ×2 IMPLANT
SURGIFLO W/THROMBIN 8M KIT (HEMOSTASIS) ×4 IMPLANT
SUT BONE WAX W31G (SUTURE) ×4 IMPLANT
SUT MNCRL AB 4-0 PS2 18 (SUTURE) IMPLANT
SUT PROLENE 3 0 SH DA (SUTURE) ×4 IMPLANT
SUT PROLENE 3 0 SH1 36 (SUTURE) IMPLANT
SUT PROLENE 4 0 RB 1 (SUTURE) ×4
SUT PROLENE 4 0 SH DA (SUTURE) ×4 IMPLANT
SUT PROLENE 4-0 RB1 .5 CRCL 36 (SUTURE) ×2 IMPLANT
SUT PROLENE 5 0 C 1 36 (SUTURE) IMPLANT
SUT PROLENE 6 0 C 1 30 (SUTURE) ×6 IMPLANT
SUT PROLENE 6 0 CC (SUTURE) ×8 IMPLANT
SUT PROLENE 8 0 BV175 6 (SUTURE) IMPLANT
SUT PROLENE BLUE 7 0 (SUTURE) ×6 IMPLANT
SUT SILK  1 MH (SUTURE)
SUT SILK 1 MH (SUTURE) IMPLANT
SUT SILK 2 0 SH CR/8 (SUTURE) ×2 IMPLANT
SUT SILK 3 0 SH CR/8 (SUTURE) IMPLANT
SUT STEEL 6MS V (SUTURE) ×6 IMPLANT
SUT STEEL SZ 6 DBL 3X14 BALL (SUTURE) ×4 IMPLANT
SUT VIC AB 1 CTX 36 (SUTURE) ×4
SUT VIC AB 1 CTX36XBRD ANBCTR (SUTURE) ×4 IMPLANT
SUT VIC AB 2-0 CT1 27 (SUTURE) ×2
SUT VIC AB 2-0 CT1 TAPERPNT 27 (SUTURE) IMPLANT
SUT VIC AB 2-0 CTX 27 (SUTURE) IMPLANT
SUT VIC AB 3-0 X1 27 (SUTURE) ×2 IMPLANT
SYSTEM SAHARA CHEST DRAIN ATS (WOUND CARE) ×4 IMPLANT
TAPE CLOTH SURG 4X10 WHT LF (GAUZE/BANDAGES/DRESSINGS) ×6 IMPLANT
TAPE PAPER 2X10 WHT MICROPORE (GAUZE/BANDAGES/DRESSINGS) ×2 IMPLANT
TOWEL GREEN STERILE (TOWEL DISPOSABLE) ×4 IMPLANT
TOWEL GREEN STERILE FF (TOWEL DISPOSABLE) ×4 IMPLANT
TRAY FOLEY SLVR 16FR TEMP STAT (SET/KITS/TRAYS/PACK) ×4 IMPLANT
TUBING LAP HI FLOW INSUFFLATIO (TUBING) ×4 IMPLANT
UNDERPAD 30X30 (UNDERPADS AND DIAPERS) ×4 IMPLANT
WATER STERILE IRR 1000ML POUR (IV SOLUTION) ×8 IMPLANT

## 2018-09-05 NOTE — Procedures (Signed)
Extubation Procedure Note  Patient Details:   Name: Jerry Myers DOB: January 02, 1951 MRN: [id number]   Airway Documentation:  Airway 8 mm (Active)  Secured at (cm) 22 cm 09/05/2018  6:05 PM  Measured From Lips 09/05/2018  6:05 PM  Secured Location Right 09/05/2018  6:05 PM  Secured By Other (Comment) 09/05/2018  6:05 PM  Site Condition Dry 09/05/2018  6:05 PM   Vent end date: (not recorded) Vent end time: (not recorded)   Evaluation  O2 sats: stable throughout Complications: No apparent complications Patient did tolerate procedure well. Bilateral Breath Sounds: Clear   Yes  Weaning mechanics done prior to extubation; NIF -50, VC with good pt effort. Pt had positive cuff leak and placed on 4L NCAN. Pt able to speak after. Voice Hoarse.  RN at bedside with IS. RT to cont to monitor.   Alexia Freestone F 09/05/2018, 10:03 PM

## 2018-09-05 NOTE — Anesthesia Procedure Notes (Addendum)
Arterial Line Insertion Start/End5/15/2020 8:05 AM, 09/05/2018 8:10 AM Performed by: [name], MD, Hart Robinsons, CRNA, CRNA  Patient location: Pre-op. Preanesthetic checklist: patient identified, IV checked, site marked, risks and benefits discussed, surgical consent, monitors and equipment checked, pre-op evaluation, timeout performed and anesthesia consent Lidocaine 1% used for infiltration Right, radial was placed Catheter size: 20 Fr Hand hygiene performed  and maximum sterile barriers used  Allen's test indicative of satisfactory collateral circulation Attempts: 1 Procedure performed without using ultrasound guided technique. Following insertion, dressing applied. Post procedure assessment: normal and unchanged  Patient tolerated the procedure well with no immediate complications.

## 2018-09-05 NOTE — Progress Notes (Signed)
TCT S p.m. Rounds  Patient stable after CABG x4 earlier today Hemodynamics satisfactory, minimal chest tube drainage Patient in process of ventilator wean Sinus rhythm Postop chest x-ray satisfactory- PA catheter repositioned

## 2018-09-05 NOTE — Anesthesia Procedure Notes (Signed)
Central Venous Catheter Insertion Performed by: Roberts Gaudy, MD, anesthesiologist Start/End5/15/2020 8:00 AM, 09/05/2018 8:10 AM Patient location: Pre-op. Preanesthetic checklist: patient identified, IV checked, site marked, risks and benefits discussed, surgical consent, monitors and equipment checked, pre-op evaluation, timeout performed and anesthesia consent Lidocaine 1% used for infiltration and patient sedated Hand hygiene performed  and maximum sterile barriers used  Catheter size: 9 Fr MAC introducer Procedure performed using ultrasound guided technique. Ultrasound Notes:anatomy identified, needle tip was noted to be adjacent to the nerve/plexus identified, no ultrasound evidence of intravascular and/or intraneural injection and image(s) printed for medical record Attempts: 1 Following insertion, line sutured and dressing applied. Post procedure assessment: blood return through all ports, free fluid flow and no air  Patient tolerated the procedure well with no immediate complications.

## 2018-09-05 NOTE — Progress Notes (Signed)
RT attempted weaning protocols. Patient still lethargic and 02 Sats in 80's. RT will continue to monitor.

## 2018-09-05 NOTE — Progress Notes (Signed)
Patient not aware of time change.  Will not arrive to hospital until 0715

## 2018-09-05 NOTE — Transfer of Care (Signed)
Immediate Anesthesia Transfer of Care Note  Patient: Jerry Myers  Procedure(s) Performed: CORONARY ARTERY BYPASS GRAFTING (CABG) x4, ON PUMP, USING LEFT INTERNAL MAMMARY ARTERY AND RIGHT AND LEFT GREAT SAPHENOUS VEIN HARVESTED ENDOSCOPICALLY (N/A Chest) TRANSESOPHAGEAL ECHOCARDIOGRAM (TEE) (N/A )  Patient Location: SICU  Anesthesia Type:General  Level of Consciousness: sedated and Patient remains intubated per anesthesia plan  Airway & Oxygen Therapy: Patient remains intubated per anesthesia plan and Patient placed on Ventilator (see vital sign flow sheet for setting)  Post-op Assessment: Report given to RN and Post -op Vital signs reviewed and stable  Post vital signs: Reviewed and stable  Last Vitals:  Vitals Value Taken Time  BP    Temp    Pulse    Resp    SpO2      Last Pain:  Vitals:   09/05/18 0735  TempSrc: Oral  PainSc: 0-No pain      Patients Stated Pain Goal: 3 (09/05/18 0735)  Complications: No apparent anesthesia complications

## 2018-09-05 NOTE — H&P (Signed)
PCP is Mikael Spray, NP Referring Provider is No ref. provider found  No chief complaint on file.  Initial consult HPI:68 year old diabetic with hypertension and family history of CABG presents after recent cardiac catheterization by Dr End showing three-vessel CAD in a diabetic pattern with moderate LV dysfunction by echo with EF 50% and moderate MR.  Patient symptoms are dyspnea with exertion.  He was evaluated by Dr. Tomie China and Myoview stress test was positive for ischemia.  He subsequently underwent cardiac catheterization via right radial artery showing chronic occlusion of the RCA which fills faintly by collaterals, occlusion of the circumflex with small disease branches and moderate but diffuse coronary disease of the LAD measured by FFR to be hemodynamically significant.  LVEDP was 14.  Because of his diabetes, LV dysfunction, and symptoms is recommended for CABG. echocardiogram showed mild-moderate central mitral   Insufficiency with EF 50%.  Normal RV function.   Patient lives with alone after the death of his wife 2 years ago.  He is able to drive and care for himself although he has difficulty with reading, medications, and has home health nurse assistance.  He states he does do some yard work but in the winter is very sedentary in his activities.   Over 10 years ago the patient had a injury at work insisting of right shoulder injury, puncture to his chest with pneumothorax and chest tube, and left foot fracture.     The patient has never smoked.  He used to drink alcohol heavily but has not had alcohol in over 10 years.  He has poor diet, cooks for himself and eats a lot of junk food he admits   The patient denies any resting symptoms or  Patient is in a difficult situation with moderate LV dysfunction, obesity, and severe three-vessel coronary disease in a diabetic pattern with suboptimal targets.  CABG would probably be his best long-term therapy however before scheduling the  patient for surgery he will need assessment of hemoglobin A1c  diabetic control, liver function, and chest CT to assess multiple pulmonary nodularity. He will return the office after these studies are completed to possibly schedule for surgery. Kerin Perna III, MD  Follow-up information  Patient has  undergone pulmonary function testing and chest x-ray which are satisfactory.  Pre-CABG Doppler showed no significant carotid disease.  His hemoglobin A1c is 9.5 but his blood sugars the past 2 weeks have been under 200.  The patient denies chest pain fever cough or signs of COVID virus.  No contacts or family members have been ill.  Patient sustained a probable tick bite in his left pretibial leg with erythema and cellulitis.  This was anesthetized and debrided and a Neosporin dressing applied.  Patient be given a course of oral doxycycline.  Since it has been 4 months since his echocardiogram this will be repeated when he returns for his preop visit.  Patient is not taking a beta-blocker.  He is provided a prescription for 1 dose of metoprolol to be taken on the morning of surgery with a sip of water. Past Medical History:  Diagnosis Date  . Anxiety   . Arthritis   . Cardiomyopathy (HCC) 04/19/2015   Ejection fraction 4045% in the fall of 2016  . Cataract    right eye  . Coronary artery disease   . Coronary artery disease of native artery of native heart with stable angina pectoris (HCC) 03/18/2015  . Depression   . Dyspnea   . Essential  hypertension 12/29/2014  . GERD (gastroesophageal reflux disease)   . History of kidney stones    20 yrs. ago  . Type 2 diabetes mellitus without complication (HCC) 12/29/2014  . 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 CARDIAC CATHETERIZATION    . FOOT SURGERY    . LEFT HEART CATH AND CORONARY ANGIOGRAPHY N/A 06/05/2018   Procedure: LEFT HEART CATH AND CORONARY ANGIOGRAPHY;  Surgeon: Yvonne KendallEnd,  Christopher, MD;  Location: [facility];  Service: Cardiovascular;  Laterality: N/A;  . LUNG SURGERY      History reviewed. No pertinent family history.  Social History Social History   Tobacco Use  . Smoking status: Never Smoker  . Smokeless tobacco: Never Used  Substance Use Topics  . Alcohol use: Never    Frequency: Never  . Drug use: Not Currently    Current Facility-Administered Medications  Medication Dose Route Frequency Provider Last Rate Last Dose  . 0.9 % irrigation (POUR BTL)    PRN Donata ClayVan Trigt, Theron AristaPeter, MD   5,000 mL at 09/05/18 0742  . chlorhexidine (HIBICLENS) 4 % liquid 2 application  30 mL Topical UD Donata ClayVan Trigt, Luzmaria Devaux, MD      . DOPamine (INTROPIN) 800 mg in dextrose 5 % 250 mL (3.2 mg/mL) infusion  0-10 mcg/kg/min Intravenous To OR Donata ClayVan Trigt, Theron AristaPeter, MD      . electrolyte-148 (PLASMALYTE-148) infusion    Continuous PRN Donata ClayVan Trigt, Theron AristaPeter, MD 15 mL/hr at 09/05/18 0759 500 mL at 09/05/18 0759  . EPINEPHrine (ADRENALIN) 4 mg in dextrose 5 % 250 mL (0.016 mg/mL) infusion  0-10 mcg/min Intravenous To OR Donata ClayVan Trigt, Theron AristaPeter, MD      . fentaNYL (SUBLIMAZE) injection 100 mcg  100 mcg Intravenous Once Kipp BroodJoslin, David, MD      . hemostatic agents    PRN Kerin PernaVan Trigt, Marisa Hufstetler, MD   1 application at 09/05/18 [id number]  . hemostatic agents    PRN Kerin PernaVan Trigt, Giulietta Prokop, MD   1 application at 09/05/18 0753  . heparin 2,500 Units, papaverine 30 mg in electrolyte-148 (PLASMALYTE-148) 500 mL irrigation   Irrigation To OR Donata ClayVan Trigt, Theron AristaPeter, MD      . heparin 30,000 units/NS 1000 mL solution for CELLSAVER   Other To OR Kerin PernaVan Trigt, Parris Cudworth, MD      . lactated ringers infusion   Intravenous Continuous Kipp BroodJoslin, David, MD 10 mL/hr at 09/05/18 [id number]    . magnesium sulfate (IV Push/IM) injection 40 mEq  40 mEq Other To OR Donata ClayVan Trigt, Theron AristaPeter, MD      . midazolam (VERSED) injection 2 mg  2 mg Intravenous Once Kipp BroodJoslin, David, MD      . nitroGLYCERIN 50 mg in dextrose 5 % 250 mL (0.2 mg/mL) infusion  2-200 mcg/min Intravenous  To OR Donata ClayVan Trigt, Theron AristaPeter, MD      . potassium chloride injection 80 mEq  80 mEq Other To OR Donata ClayVan Trigt, Theron AristaPeter, MD      . Surgifoam 1 Gm with 0.9% sodium chloride (4 ml) topical solution    PRN Donata ClayVan Trigt, Theron AristaPeter, MD   4 mL at 09/05/18 0752  . tranexamic acid (CYKLOKAPRON) pump prime solution 192 mg  2 mg/kg Intracatheter To OR Kerin PernaVan Trigt, Merridith Dershem, MD       Facility-Administered Medications Ordered in Other Encounters  Medication Dose Route Frequency Provider Last Rate Last Dose  . albumin human 5 % solution    Continuous PRN Hart RobinsonsByrd, Lauren R, CRNA 0 mL/hr at  09/05/18 1407    . fentaNYL (SUBLIMAZE) injection   Intravenous Anesthesia Intra-op Hart Robinsons, CRNA   50 mcg at 09/05/18 1400  . heparin injection    Anesthesia Intra-op Hart Robinsons, CRNA   30,000 Units at 09/05/18 1032  . lactated ringers infusion    Continuous PRN Hart Robinsons, CRNA   Stopped at 09/05/18 1358  . lactated ringers infusion    Continuous PRN Hart Robinsons, CRNA      . lidocaine 2% (20 mg/mL) 5 mL syringe   Intravenous Anesthesia Intra-op Hart Robinsons, CRNA   40 mg at 09/05/18 0848  . midazolam (VERSED) 5 MG/5ML injection    Anesthesia Intra-op Hart Robinsons, CRNA   1 mg at 09/05/18 1407  . propofol (DIPRIVAN) 10 mg/mL bolus/IV push    Anesthesia Intra-op Hart Robinsons, CRNA   70 mg at 09/05/18 0848  . protamine injection    Anesthesia Intra-op Hart Robinsons, CRNA   300 mg at 09/05/18 1333  . rocuronium bromide 10 mg/mL (PF) syringe   Intravenous Anesthesia Intra-op Hart Robinsons, CRNA   50 mg at 09/05/18 1245    No Known Allergies  Review of Systems  No Change since his last visit-consultation  BP (!) 176/90   Pulse 60 Comment: at 0757 - give per dr Noreene Larsson  Temp 97.6 F (36.4 C) (Oral)   Resp 18   Ht  (1.727 m)   Wt 96.2 kg   SpO2 98%   BMI 32.23 kg/m  Physical Exam      Exam    General- alert and comfortable    Neck- no JVD, no cervical adenopathy palpable, no carotid bruit   Lungs- clear  without rales, wheezes   Cor- regular rate and rhythm, no murmur , gallop   Abdomen- soft, non-tender   Extremities - warm, non-tender, minimal edema.  Ulcerated wound in left pretibial area with eschar and surrounding cellulitis   Neuro- oriented, appropriate, no focal weakness   Diagnostic Tests: Severe three-vessel coronary disease, total occlusion RCA, total occlusion of the circumflex, 80% stenosis of the distal LAD. Patient has positive stress test for ischemia.  Impression: Patient has urgent need for surgical revascularization due to severe three-vessel coronary disease, diabetes, and moderate reduction in LV function.  It is not safe to wait for surgery any further now that the current COVID viral patient load has been removed from [facility].  The patient understands that he will probably not have family visitors but he can communicate with family after surgery.  I have told the patient's family/sister I will call them daily reports on his condition.  Plan: Admit for multivessel CABG on May 15 at [facility]. Preoperative assessment on May 12 at short stay.  Patient will need repeat echocardiogram prior to surgery since it is been almost 4 months since his last study.  Mikey Bussing, MD [facility] [phone]

## 2018-09-05 NOTE — Progress Notes (Signed)
  Echocardiogram Echocardiogram Transesophageal has been performed.  Jerry Myers 09/05/2018, 9:14 AM

## 2018-09-05 NOTE — Brief Op Note (Signed)
09/05/2018  1:00 PM  PATIENT:  Jerry Myers  68 y.o. male  PRE-OPERATIVE DIAGNOSIS:  CAD  POST-OPERATIVE DIAGNOSIS:  CAD  PROCEDURE:  Procedure(s): CORONARY ARTERY BYPASS GRAFTING (CABG) x4, ON PUMP, USING LEFT INTERNAL MAMMARY ARTERY AND RIGHT AND LEFT GREAT SAPHENOUS VEIN HARVESTED ENDOSCOPICALLY (N/A) TRANSESOPHAGEAL ECHOCARDIOGRAM (TEE) (N/A) LIMA-LAD SVG-OM SVG-PD SVG-PL  SURGEON:  Surgeon(s) and Role:    Kerin Perna, MD - Primary  PHYSICIAN ASSISTANT: WAYNE GOLD PA-C  ANESTHESIA:   general  EBL:  300 ml   BLOOD ADMINISTERED:none  DRAINS: ROUTINE PLEURAL AND PERICARDIAL CHEST DRAINS   LOCAL MEDICATIONS USED:  NONE  SPECIMEN:  No Specimen  DISPOSITION OF SPECIMEN:  N/A  COUNTS:  YES  TOURNIQUET:  * No tourniquets in log *  DICTATION: .Other Dictation: Dictation Number [id number]  PLAN OF CARE: Admit to inpatient   PATIENT DISPOSITION:  ICU - intubated and hemodynamically stable.   Delay start of Pharmacological VTE agent (>24hrs) due to surgical blood loss or risk of bleeding: yes  COMPLICATIONS: NO KNOWN

## 2018-09-05 NOTE — Anesthesia Procedure Notes (Signed)
Procedure Name: Intubation Date/Time: 09/05/2018 8:51 AM Performed by: Hart Robinsons, CRNA Pre-anesthesia Checklist: Patient identified, Emergency Drugs available, Suction available and Patient being monitored Patient Re-evaluated:Patient Re-evaluated prior to induction Oxygen Delivery Method: Circle System Utilized Preoxygenation: Pre-oxygenation with 100% oxygen Induction Type: IV induction Ventilation: Mask ventilation without difficulty and Oral airway inserted - appropriate to patient size Laryngoscope Size: Hyacinth Meeker and 2 Grade View: Grade I Tube type: Oral Tube size: 8.0 mm Number of attempts: 1 Airway Equipment and Method: Stylet and Oral airway Placement Confirmation: ETT inserted through vocal cords under direct vision,  positive ETCO2 and breath sounds checked- equal and bilateral Secured at: 22 cm Tube secured with: Tape Dental Injury: Teeth and Oropharynx as per pre-operative assessment

## 2018-09-05 NOTE — Op Note (Signed)
NAME: Jerry Myers, Jerry A. MEDICAL RECORD [id number] ACCOUNT [id number] DATE OF BIRTH:08/13/1950 FACILITY: [facility] LOCATION: [facility] PHYSICIAN:Sharon Stapel VAN TRIGT III, MD  OPERATIVE REPORT  DATE OF PROCEDURE:  09/05/2018  OPERATION: 1.  Coronary artery bypass grafting x4 (left internal mammary artery to left anterior descending, saphenous vein graft to OM1, saphenous vein graft to distal posterolateral circumflex, saphenous vein graft to posterior descending). 2.  Endoscopic harvest of right leg and left leg greater saphenous vein.  SURGEON:  Kerin PernaPeter Van Trigt, MD  ASSISTANT:  Gershon CraneWayne Gold, PA-C.  ANESTHESIA:  General.  PREOPERATIVE DIAGNOSES:  Severe 3-vessel coronary artery disease, poorly managed diabetes, moderate left ventricular dysfunction, class III angina.  POSTOPERATIVE DIAGNOSES:  Severe 3-vessel coronary artery disease, poorly managed diabetes, moderate ventricular dysfunction, class III angina.  CLINICAL NOTE:  The patient is an obese diabetic with sedentary lifestyle who had previously undergone cardiac catheterization by Dr. Okey DupreEnd for symptoms of angina.  This was approximately 3-4 months ago.  At that time echo showed ejection fraction of 50%.   He was evaluated and felt to be a candidate for surgery based on his multivessel CAD and diabetes and symptoms.  However, before the surgery could be accomplished, the hospital policy for patient care change due to the COVID-19 crisis.  Elective surgery  was postponed.  I followed the patient and when the COVID policies were altered recently, I evaluated the patient for surgery and felt he was still a candidate and would benefit from multivessel CABG.  A repeat echo however, showed his ejection fraction  had decreased to approximately 35%.  The patient still had exertional symptoms of angina, but no resting symptoms and no symptoms of CHF.  I discussed with him the benefits of surgery, the details of surgery including the location of the  surgical  incisions, the use of general anesthesia and cardiopulmonary bypass, and the expected postoperative recovery.  He also understood the risks of surgery including the risks of stroke, bleeding, infection, postoperative pulmonary problems due to his  obesity, postoperative organ failure, and death.  He agreed to proceed with surgery.  DESCRIPTION OF PROCEDURE:  The patient was brought directly to the operating room from preoperative holding where informed consent was obtained and the patient was prepared for surgery.  General anesthesia was induced by the anesthesia team.  The patient  remained stable.  The chest, abdomen and legs were prepped with Betadine, draped as a sterile field.  A proper time-out was performed.  A transesophageal echo probe has been placed by the anesthesia team.  A sternal incision was made as the saphenous vein was harvested first from the right leg and then from the left leg with small incisions at the knee.  The left internal mammary artery was harvested after the sternotomy.  It was a 1.5 mm vessel with good  flow.  The sternal retractor was placed using the deep blades because of the patient's obese body habitus.  The pericardium was opened and suspended.  Pursestrings were placed in the ascending aorta and right atrium and heparin was administered.  The  patient was then cannulated and placed on cardiopulmonary bypass after the ACT was documented as being therapeutic.  The patient remained stable.  The patient was then cannulated and placed on cardiopulmonary bypass.  The coronaries were identified for  grafting.  The mammary artery and vein grafts were prepared for the distal anastomoses.  Cardioplegic cannulas were placed with both antegrade and retrograde cold blood cardioplegia.  The coronaries were diffusely  calcified and were suboptimal targets for grafting.  The right coronary was totally occluded.  The circumflex marginal branches were totally occluded.  The  LAD had a proximal 80% stenosis, but had severe calcification  throughout its length.  The patient was cooled to 32 degrees, and the aortic crossclamp was applied.  A liter of cold blood cardioplegia was delivered in split doses between the antegrade aortic and retrograde coronary sinus catheters.  There was good cardioplegic arrest and  supple temperature dropped less than 14 degrees.  Cardioplegia was delivered every 20 minutes.  The distal coronary anastomoses were performed.  The first distal anastomosis was to the posterior descending branch of right coronary, which was totally occluded.  Reverse saphenous vein was sewn end-to-side with running 7-0 Prolene to the small 1.2 mm  vessel.  There was good flow through the graft.  Cardioplegia was redosed.  A second distal anastomosis was to the distal circumflex posterolateral branch.  It was totally occluded.  It was a 1.4 mm vessel.  Reverse saphenous vein was sewn end-to-side with running 7-0 Prolene with good flow through the graft.  Cardioplegia was  redosed.  The third distal anastomosis was the OM1.  This was also totally occluded.  Reverse saphenous vein was sewn end-to-side with running 7-0 Prolene with adequate flow through the graft.  Cardioplegia was redosed.  The fourth distal anastomosis was the very distal LAD.  There was a 1.5 mm vessel.  The left IMA pedicle was brought through an opening in the left lateral pericardium and was brought down onto the LAD and sewn end-to-side with a running 7-0 Prolene.   There was good flow through the anastomosis after briefly releasing the bulldog pedicle clamp.  The bulldog pedicle clamp was reapplied and the pedicle was secured to the epicardium.  Cardioplegia was redosed.  With the cross clamp was still in place, 3 proximal vein anastomoses were performed using a 4.5 mm punch and running 6-0 Prolene.  Prior to tying down the final proximal anastomosis, air was vented from the coronaries with a  dose of retrograde warm blood  cardioplegia.  The crossclamp was removed.  The vein grafts were deaired and opened.  Each had good flow, and hemostasis was documented at the proximal and distal sites.  The patient was rewarmed and reperfused.  The cardioplegia catheters were removed.  Temporary pacing wires were applied.  The  patient was started on low-dose milrinone.  The lungs were expanded and the ventilator was resumed.  The patient was then weaned from cardiopulmonary bypass without difficulty.  Cardiac output was normal.  Echo showed improvement in global function.   Protamine was administered without adverse reaction.  There was good hemostasis after the protamine.  The superior pericardial fat was closed over the aorta.  The anterior mediastinum and left pleural chest tubes were placed.  The sternum was then closed  with a wire.  The patient remained stable after sternal closure.  The pectoralis fascia was closed with a running Vicryl.  Subcutaneous and skin layers were closed with a running Vicryl.  Sterile dressings were applied.  Total cardiopulmonary bypass  time was 138 minutes.  TN/NUANCE  D:09/05/2018 T:09/05/2018 JOB:[id number]

## 2018-09-06 ENCOUNTER — Inpatient Hospital Stay (HOSPITAL_COMMUNITY): Payer: Medicare Other

## 2018-09-06 LAB — POCT I-STAT 7, (LYTES, BLD GAS, ICA,H+H)
Acid-base deficit: 6 mmol/L — ABNORMAL HIGH (ref 0.0–2.0)
Acid-base deficit: 5 mmol/L — ABNORMAL HIGH (ref 0.0–2.0)
Acid-base deficit: 4 mmol/L — ABNORMAL HIGH (ref 0.0–2.0)
Acid-base deficit: 4 mmol/L — ABNORMAL HIGH (ref 0.0–2.0)
Bicarbonate: 19.4 mmol/L — ABNORMAL LOW (ref 20.0–28.0)
Bicarbonate: 20.4 mmol/L (ref 20.0–28.0)
Bicarbonate: 19.9 mmol/L — ABNORMAL LOW (ref 20.0–28.0)
Bicarbonate: 20 mmol/L (ref 20.0–28.0)
Calcium, Ion: 1.09 mmol/L — ABNORMAL LOW (ref 1.15–1.40)
Calcium, Ion: 1.21 mmol/L (ref 1.15–1.40)
Calcium, Ion: 1.17 mmol/L (ref 1.15–1.40)
Calcium, Ion: 1.12 mmol/L — ABNORMAL LOW (ref 1.15–1.40)
HCT: 23 % — ABNORMAL LOW (ref 39.0–52.0)
HCT: 23 % — ABNORMAL LOW (ref 39.0–52.0)
HCT: 22 % — ABNORMAL LOW (ref 39.0–52.0)
HCT: 26 % — ABNORMAL LOW (ref 39.0–52.0)
Hemoglobin: 7.8 g/dL — ABNORMAL LOW (ref 13.0–17.0)
Hemoglobin: 7.8 g/dL — ABNORMAL LOW (ref 13.0–17.0)
Hemoglobin: 7.5 g/dL — ABNORMAL LOW (ref 13.0–17.0)
Hemoglobin: 8.8 g/dL — ABNORMAL LOW (ref 13.0–17.0)
O2 Saturation: 94 %
O2 Saturation: 95 %
O2 Saturation: 93 %
O2 Saturation: 94 %
Patient temperature: 37.1
Patient temperature: 37
Patient temperature: 98.3
Potassium: 3.7 mmol/L (ref 3.5–5.1)
Potassium: 3.7 mmol/L (ref 3.5–5.1)
Potassium: 3.9 mmol/L (ref 3.5–5.1)
Potassium: 4.1 mmol/L (ref 3.5–5.1)
Sodium: 143 mmol/L (ref 135–145)
Sodium: 143 mmol/L (ref 135–145)
Sodium: 142 mmol/L (ref 135–145)
Sodium: 140 mmol/L (ref 135–145)
TCO2: 20 mmol/L — ABNORMAL LOW (ref 22–32)
TCO2: 21 mmol/L — ABNORMAL LOW (ref 22–32)
TCO2: 21 mmol/L — ABNORMAL LOW (ref 22–32)
TCO2: 21 mmol/L — ABNORMAL LOW (ref 22–32)
pCO2 arterial: 35 mmHg (ref 32.0–48.0)
pCO2 arterial: 36.5 mmHg (ref 32.0–48.0)
pCO2 arterial: 31.4 mmHg — ABNORMAL LOW (ref 32.0–48.0)
pCO2 arterial: 31 mmHg — ABNORMAL LOW (ref 32.0–48.0)
pH, Arterial: 7.354 (ref 7.350–7.450)
pH, Arterial: 7.354 (ref 7.350–7.450)
pH, Arterial: 7.408 (ref 7.350–7.450)
pH, Arterial: 7.416 (ref 7.350–7.450)
pO2, Arterial: 73 mmHg — ABNORMAL LOW (ref 83.0–108.0)
pO2, Arterial: 81 mmHg — ABNORMAL LOW (ref 83.0–108.0)
pO2, Arterial: 66 mmHg — ABNORMAL LOW (ref 83.0–108.0)
pO2, Arterial: 67 mmHg — ABNORMAL LOW (ref 83.0–108.0)

## 2018-09-06 LAB — BASIC METABOLIC PANEL
Anion gap: 7 (ref 5–15)
Anion gap: 8 (ref 5–15)
BUN: 20 mg/dL (ref 8–23)
BUN: 24 mg/dL — ABNORMAL HIGH (ref 8–23)
CO2: 22 mmol/L (ref 22–32)
CO2: 20 mmol/L — ABNORMAL LOW (ref 22–32)
Calcium: 8.1 mg/dL — ABNORMAL LOW (ref 8.9–10.3)
Calcium: 7.8 mg/dL — ABNORMAL LOW (ref 8.9–10.3)
Chloride: 113 mmol/L — ABNORMAL HIGH (ref 98–111)
Chloride: 110 mmol/L (ref 98–111)
Creatinine, Ser: 1.19 mg/dL (ref 0.61–1.24)
Creatinine, Ser: 1.36 mg/dL — ABNORMAL HIGH (ref 0.61–1.24)
GFR calc Af Amer: >60 mL/min (ref >60)
GFR calc Af Amer: >60 mL/min (ref >60)
GFR calc non Af Amer: >60 mL/min (ref >60)
GFR calc non Af Amer: 52 mL/min — ABNORMAL LOW (ref >60)
Glucose, Bld: 123 mg/dL — ABNORMAL HIGH (ref 70–99)
Glucose, Bld: 218 mg/dL — ABNORMAL HIGH (ref 70–99)
Potassium: 3.7 mmol/L (ref 3.5–5.1)
Potassium: 4.1 mmol/L (ref 3.5–5.1)
Sodium: 142 mmol/L (ref 135–145)
Sodium: 138 mmol/L (ref 135–145)

## 2018-09-06 LAB — GLUCOSE, CAPILLARY
Glucose-Capillary: 120 mg/dL — ABNORMAL HIGH (ref 70–99)
Glucose-Capillary: 124 mg/dL — ABNORMAL HIGH (ref 70–99)
Glucose-Capillary: 127 mg/dL — ABNORMAL HIGH (ref 70–99)
Glucose-Capillary: 133 mg/dL — ABNORMAL HIGH (ref 70–99)
Glucose-Capillary: 143 mg/dL — ABNORMAL HIGH (ref 70–99)
Glucose-Capillary: 146 mg/dL — ABNORMAL HIGH (ref 70–99)
Glucose-Capillary: 164 mg/dL — ABNORMAL HIGH (ref 70–99)
Glucose-Capillary: 166 mg/dL — ABNORMAL HIGH (ref 70–99)
Glucose-Capillary: 172 mg/dL — ABNORMAL HIGH (ref 70–99)
Glucose-Capillary: 181 mg/dL — ABNORMAL HIGH (ref 70–99)
Glucose-Capillary: 190 mg/dL — ABNORMAL HIGH (ref 70–99)
Glucose-Capillary: 115 mg/dL — ABNORMAL HIGH (ref 70–99)

## 2018-09-06 LAB — CBC
HCT: 25.6 % — ABNORMAL LOW (ref 39.0–52.0)
HCT: 26.4 % — ABNORMAL LOW (ref 39.0–52.0)
Hemoglobin: 8.6 g/dL — ABNORMAL LOW (ref 13.0–17.0)
Hemoglobin: 8.8 g/dL — ABNORMAL LOW (ref 13.0–17.0)
MCH: 30.3 pg (ref 26.0–34.0)
MCH: 30.2 pg (ref 26.0–34.0)
MCHC: 33.6 g/dL (ref 30.0–36.0)
MCHC: 33.3 g/dL (ref 30.0–36.0)
MCV: 90.1 fL (ref 80.0–100.0)
MCV: 90.7 fL (ref 80.0–100.0)
Platelets: 157 10*3/uL (ref 150–400)
Platelets: 149 10*3/uL — ABNORMAL LOW (ref 150–400)
RBC: 2.84 MIL/uL — ABNORMAL LOW (ref 4.22–5.81)
RBC: 2.91 MIL/uL — ABNORMAL LOW (ref 4.22–5.81)
RDW: 13.6 % (ref 11.5–15.5)
RDW: 14.2 % (ref 11.5–15.5)
WBC: 12 10*3/uL — ABNORMAL HIGH (ref 4.0–10.5)
WBC: 15.1 10*3/uL — ABNORMAL HIGH (ref 4.0–10.5)
nRBC: 0 % (ref 0.0–0.2)
nRBC: 0 % (ref 0.0–0.2)

## 2018-09-06 LAB — MAGNESIUM
Magnesium: 2.3 mg/dL (ref 1.7–2.4)
Magnesium: 2.2 mg/dL (ref 1.7–2.4)

## 2018-09-06 MED ORDER — ENOXAPARIN SODIUM 40 MG/0.4ML ~~LOC~~ SOLN
40.0000 mg | Freq: Every day | SUBCUTANEOUS | Status: DC
  Administered 2018-09-06 – 2018-09-11 (×6): 40 mg via SUBCUTANEOUS
  Filled 2018-09-06 (×6): qty 0.4

## 2018-09-06 MED ORDER — FUROSEMIDE 10 MG/ML IJ SOLN
40.0000 mg | Freq: Two times a day (BID) | INTRAMUSCULAR | Status: DC
  Administered 2018-09-06 – 2018-09-08 (×5): 40 mg via INTRAVENOUS
  Filled 2018-09-06 (×5): qty 4

## 2018-09-06 MED ORDER — INSULIN ASPART 100 UNIT/ML ~~LOC~~ SOLN
4.0000 [IU] | Freq: Three times a day (TID) | SUBCUTANEOUS | Status: DC
  Administered 2018-09-07 (×3): 4 [IU] via SUBCUTANEOUS

## 2018-09-06 MED ORDER — ALBUMIN HUMAN 5 % IV SOLN
12.5000 g | Freq: Once | INTRAVENOUS | Status: AC
  Administered 2018-09-06: 12.5 g via INTRAVENOUS
  Filled 2018-09-06: qty 250

## 2018-09-06 MED ORDER — POTASSIUM CHLORIDE 10 MEQ/50ML IV SOLN
10.0000 meq | INTRAVENOUS | Status: AC
  Administered 2018-09-06 (×3): 10 meq via INTRAVENOUS
  Filled 2018-09-06 (×3): qty 50

## 2018-09-06 MED ORDER — ACETAMINOPHEN 650 MG RE SUPP: 650.0000 mg | Freq: Once | RECTAL | Status: DC

## 2018-09-06 MED ORDER — INSULIN ASPART 100 UNIT/ML ~~LOC~~ SOLN
0.0000 [IU] | SUBCUTANEOUS | Status: DC
  Administered 2018-09-06 (×2): 8 [IU] via SUBCUTANEOUS
  Administered 2018-09-07: 4 [IU] via SUBCUTANEOUS
  Administered 2018-09-07: 08:00:00 8 [IU] via SUBCUTANEOUS
  Administered 2018-09-07: 4 [IU] via SUBCUTANEOUS
  Administered 2018-09-07 (×3): 8 [IU] via SUBCUTANEOUS
  Administered 2018-09-07: 4 [IU] via SUBCUTANEOUS
  Administered 2018-09-08 (×2): 2 [IU] via SUBCUTANEOUS

## 2018-09-06 MED ORDER — SODIUM CHLORIDE 0.9 % IV SOLN
1.0000 g | Freq: Once | INTRAVENOUS | Status: AC
  Administered 2018-09-06: 02:00:00 1 g via INTRAVENOUS
  Filled 2018-09-06: qty 10

## 2018-09-06 MED ORDER — ACETAMINOPHEN 650 MG RE SUPP
650.0000 mg | Freq: Four times a day (QID) | RECTAL | Status: DC | PRN
  Administered 2018-09-06: 650 mg via RECTAL
  Filled 2018-09-06: qty 1

## 2018-09-06 MED ORDER — MORPHINE SULFATE (PF) 2 MG/ML IV SOLN
2.0000 mg | INTRAVENOUS | Status: DC | PRN
  Filled 2018-09-06: qty 1

## 2018-09-06 MED ORDER — INSULIN DETEMIR 100 UNIT/ML ~~LOC~~ SOLN
15.0000 [IU] | Freq: Two times a day (BID) | SUBCUTANEOUS | Status: DC
  Administered 2018-09-06 – 2018-09-07 (×3): 15 [IU] via SUBCUTANEOUS
  Filled 2018-09-06 (×5): qty 0.15

## 2018-09-06 MED ORDER — ACETAMINOPHEN 160 MG/5ML PO SOLN: 650.0000 mg | Freq: Once | ORAL | Status: DC

## 2018-09-06 NOTE — Anesthesia Postprocedure Evaluation (Signed)
Anesthesia Post Note  Patient: Jerry Myers  Procedure(s) Performed: CORONARY ARTERY BYPASS GRAFTING (CABG) x4, ON PUMP, USING LEFT INTERNAL MAMMARY ARTERY AND RIGHT AND LEFT GREAT SAPHENOUS VEIN HARVESTED ENDOSCOPICALLY (N/A Chest) TRANSESOPHAGEAL ECHOCARDIOGRAM (TEE) (N/A )     Patient location during evaluation: SICU Anesthesia Type: General Level of consciousness: sedated and patient remains intubated per anesthesia plan Pain management: pain level controlled Vital Signs Assessment: post-procedure vital signs reviewed and stable Respiratory status: patient remains intubated per anesthesia plan and patient on ventilator - see flowsheet for VS Cardiovascular status: stable Postop Assessment: no apparent nausea or vomiting Anesthetic complications: no    Last Vitals:  Vitals:   09/06/18 0400 09/06/18 0500  BP: 110/73 110/77  Pulse: 89 87  Resp: (!) 26 (!) 27  Temp: 37 C 37 C  SpO2: 97%     Last Pain:  Vitals:   09/06/18 0500  TempSrc:   PainSc: 6                  Chandel Zaun COKER

## 2018-09-06 NOTE — Discharge Summary (Addendum)
Physician Discharge Summary  Patient ID: Jerry Myers MRN: [id number] DOB/AGE: June 29, 1950 68 y.o.  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  He developed dyspnea on exertion 3 to 4 months ago and was evaluated by Dr. Tomie China with a nuclear stress test that was positive for ischemia.  He subsequently underwent left heart catheterization by way of the right radial artery showing chronic occlusion of the right coronary artery, occlusion of the circumflex coronary artery with small diseased branches, and moderately diffuse diseased LAD.  Echocardiogram showed mild to moderate central mitral insufficiency and an ejection fraction of about 50%.  He was reported to have normal RV function.  He was referred for consideration of coronary bypass grafting at the time but due to the COVID-19 crisis and restrictions on elective procedure, his surgery was postponed.  He has been followed by Dr. Donata Clay the interim.  Repeat echocardiography demonstrates an ejection fraction of about 35%.  Since the policies restricting elective surgeries have been modified, the patient is now admitted to the hospital for coronary bypass grafting.   Hospital Course:   Jerry Myers was brought to the hospital for elective surgery on 09/05/2018.  He was prepared and taken to the operating room where CABG x4 was accomplished by Dr. Donata Clay.  He separated from cardiopulmonary bypass without difficulty on milrinone and Levophed drips.  He was transferred to the cardiovascular ICU following the procedure.  He remained hemodynamically stable.  He was weaned from the ventilator and extubated the evening of surgery. He developed atrial flutter early post-op that was managed with amiodarone as in infusion initially and was later converted to oral amiodarone for a few days and eventually discontinued.  He did not have any further atrial arrhythmias of significance.  His diabetes was managed with an insulin drip initially and later converted to long-acting Levemir and sliding scale coverage as required. At discharge he will resume home meds. He was mobilized with PT and made good progress and was independent with mobility within a few days.  He will be sent home with [facility]- restorative care arranged.   Consults: None  Significant Diagnostic Studies:  Procedures   LEFT HEART CATH AND CORONARY ANGIOGRAPHY  Conclusion   Conclusions: 1. Significant three-vessel coronary artery disease, including diffuse LAD disease up to 70% (DFR of distal LAD is 0.71; cut-point for hemodynamic significance is 0.89), calcified 90% mid LCx stenosis as well as chronic occlusions of several OM branches, and chronic total occlusion of mid RCA with bridging and left-to-right collaterals. 2. Normal left ventricular filling pressure.  Recommendations: 1. Outpatient cardiac surgery consultation.  Though chest pain is atypical, he may not experience typical angina in the setting of diabetes mellitus.  He would benefit from CABG based on his coronary anatomy on history of diabetes mellitus. 2. Aggressive secondary prevention.  Yvonne Kendall, MD [facility] Pager: [phone]   Recommendations   Antiplatelet/Anticoag Indefinite aspirin 81 mg daily.  Defer adding P2Y12 inhibitor pending cardiac surgery consultation.  Indications   Atypical chest  pain [R07.89 (ICD-10-CM)]  Abnormal stress test [R94.39 (ICD-10-CM)]  Procedural Details   Technical Details Indication: 68 y.o. year-old man with history of diabetes mellitus, hypertension, hyperlipidemia, and bradycardia, presenting for evaluation of intermittent chest pain and abnormal, high-risk myocardial perfusion stress test.  GFR: 66 ml/min  Procedure: The risks, benefits, complications, treatment options, and expected outcomes were discussed with the patient. The patient and/or family concurred with the proposed plan, giving informed consent. The patient was brought to the cath lab after IV hydration was begun and oral premedication was given. The patient was further sedated with Versed and Fentanyl. The right wrist was assessed with a modified Allens test which was normal. The right wrist was prepped and draped in a sterile fashion. 1% lidocaine was used for local anesthesia. Using the modified Seldinger access technique, a 78F slender Glidesheath was placed in the right radial artery. 3 mg Verapamil was given through the sheath. Heparin 5,000 units were administered.  Selective coronary angiography was performed using 51F JL5 and JR4 catheters to engage the left and right coronary arteries, respectively. Left heart catheterization was performed using a 51F JR4 catheter. Left ventriculogram was not performed (recent echo showed LVEF 50-55%).  DFR of LAD Heparin was used for anticoagulation.  The left coronary artery was engaged with a 78F EBU4 guide catheter.  A Comet pressure wire was advanced into the distal LAD, where DFR was obtained (DFR 0.71; cut-point for hemodynamic significance is 0.89.).  Final angiogram demonstrates stable appearance of the left coronary artery.  At the end of the procedure, the radial artery sheath was removed and a TR band applied to achieve patent hemostasis. There were no immediate complications. The patient was taken to the recovery area in stable condition.   Contrast used: 80 mL Fluoroscopy time: 8.31min Radiation dose: 563 mGy  Estimated blood loss <50 mL.   During this procedure medications were administered to achieve and maintain moderate conscious sedation while the patient's heart rate, blood pressure, and oxygen saturation were continuously monitored and I was present face-to-face 100% of this time.  Medications  (Filter: Administrations occurring from 06/05/18 0710 to 06/05/18 0842)  Medication Rate/Dose/Volume Action  Date Time   Heparin (Porcine) in NaCl 1000-0.9 UT/500ML-% SOLN (mL) 500 mL Given 06/05/18 0726   Total dose as of 09/06/18 1639 500 mL Given 0726   1,000 mL        fentaNYL (SUBLIMAZE) injection (mcg) 25 mcg Given 06/05/18 0736   Total dose as of 09/06/18 1639 25 mcg Given 0814   50 mcg        midazolam (VERSED) injection (mg) 1 mg Given 06/05/18 0737   Total dose as of 09/06/18 1639        1 mg        lidocaine (PF) (XYLOCAINE) 1 % injection (mL) 2 mL Given 06/05/18 0738   Total dose as of 09/06/18 1639        2 mL        heparin injection (Units) 5,000 Units Given 06/05/18 0744   Total dose as of 09/06/18 1639 5,000 Units Given 0802   12,000 Units 2,000 Units Given 0815   Radial Cocktail/Verapamil only (mL) 10 mL Given 06/05/18 0739   Total dose as of 09/06/18 1639        10 mL        iohexol (OMNIPAQUE) 350 MG/ML injection (mL) 80 mL Given 06/05/18 0835   Total dose as of 09/06/18 1639  80 mL        Sedation Time   Sedation Time Physician-1: 50 minutes  Complications   Complications documented before study signed (06/05/2018 6:18 PM EST)    No complications were associated with this study.  Documented by Yvonne Kendall, MD - 06/05/2018 6:14 PM EST    Coronary Findings   Diagnostic  Dominance: Co-dominant  Left Main  Vessel is large.  Dist LM lesion 25% stenosed  Dist LM lesion is 25% stenosed.  Left Anterior Descending  The vessel is severely calcified.  Ost LAD lesion 20% stenosed   Ost LAD lesion is 20% stenosed.  Prox LAD lesion 50% stenosed  Prox LAD lesion is 50% stenosed. The lesion is calcified. Pressure wire/FFR was performed on the lesion. DFR 0.71.  Mid LAD-1 lesion 40% stenosed  Mid LAD-1 lesion is 40% stenosed. DFR 0.71  Mid LAD-2 lesion 70% stenosed  Mid LAD-2 lesion is 70% stenosed. DFR 0.71.  First Diagonal Branch  Vessel is small in size.  Second Diagonal Branch  Vessel is moderate in size.  Ost 2nd Diag to 2nd Diag lesion 80% stenosed  Ost 2nd Diag to 2nd Diag lesion is 80% stenosed.  Left Circumflex  Vessel is large. The vessel is moderately calcified.  Mid Cx lesion 90% stenosed  Mid Cx lesion is 90% stenosed. The lesion is severely calcified.  First Obtuse Marginal Branch  Vessel is moderate in size.  1st Mrg lesion 90% stenosed  1st Mrg lesion is 90% stenosed.  Second Obtuse Marginal Branch  Vessel is large in size.  Collaterals  2nd Mrg filled by collaterals from 1st Mrg.    2nd Mrg-1 lesion 80% stenosed  2nd Mrg-1 lesion is 80% stenosed. The lesion is eccentric.  2nd Mrg-2 lesion 100% stenosed  2nd Mrg-2 lesion is 100% stenosed. The lesion is chronically occluded with left-to-left collateral flow.  Third Obtuse Marginal Branch  Vessel is moderate in size.  Collaterals  3rd Mrg filled by collaterals from Dist LAD.    Ost 3rd Mrg lesion 100% stenosed  Ost 3rd Mrg lesion is 100% stenosed. The lesion is chronically occluded with left-to-left collateral flow.  Left Posterior Descending Artery  Vessel is small in size. There is mild disease in the vessel.  Right Coronary Artery  Vessel is moderate in size.  Prox RCA lesion 50% stenosed  Prox RCA lesion is 50% stenosed.  Mid RCA lesion 100% stenosed  Mid RCA lesion is 100% stenosed. The lesion is chronically occluded with bridging and left-to-right collateral flow.  Acute Marginal Branch  Acute Mrg lesion 70% stenosed  Acute Mrg lesion is 70% stenosed.  Right Posterior Descending  Artery  Vessel is small in size.  Right Posterior Atrioventricular Branch  Vessel is moderate in size.  First Right Posterolateral  Collaterals  1st RPLB filled by collaterals from 2nd Sept.    Intervention   No interventions have been documented.  Left Heart   Left Ventricle LV end diastolic pressure is normal. LVEDP 10-15 mmHg.  Aortic Valve There is no aortic valve stenosis.  Coronary Diagrams   Diagnostic  Dominance: Co-dominant      Result Notes for ECHOCARDIOGRAM COMPLETE   Notes recorded by Lita Mains, RN on 05/13/2018 at 8:40 AM EST Patient informed please see telephone encounter ------  Notes recorded by Lita Mains, RN on 05/12/2018 at 11:50 AM EST Left message for patient to return call ------  Notes recorded by Crist Fat, RN on 05/09/2018 at 4:25  PM EST Patient informed of echocardiogram results and verbalized understanding. Patient reminded of upcoming exercise nuclear stress test on 05/27/2018. Patient states that he does not think he can walk on the treadmill and prefers to do a lexiscan myoview. Will have Dr. Tomie China advise and update patient accordingly. ------  Notes recorded by Revankar, Aundra Dubin, MD on 05/09/2018 at 1:35 PM EST Normal systolic function. Moderate mitral regurgitation. Will discuss details in appointment. Garwin Brothers, MD 05/09/2018 1:35 PM      Study Result   Result status: Final result                     *[facility]*                        [address]                         [place], [place] [address]                            [phone]  ------------------------------------------------------------------- Transthoracic Echocardiography  Patient:    Jerry Myers, Jerry Myers MR #:       [id number] Study Date: 05/09/2018 Gender:     M Age:        68 Height:     172.7 cm Weight:     95.7 kg BSA:        2.17 m^2 Pt. Status: Room:   ATTENDING    Belva Crome, MD  ORDERING      Belva Crome, MD  SONOGRAPHER  Lanae Crumbly, RDCS  REFERRING    Mikael Spray  PERFORMING   [facility], [facility]  cc:  ------------------------------------------------------------------- LV EF: 50% -   55%  ------------------------------------------------------------------- Indications:      425.4 Other primary Cardiomyopathies.  ------------------------------------------------------------------- History:   PMH:  Ventricular extrasystoles.  Risk factors: Hypertension. Diabetes mellitus.  ------------------------------------------------------------------- Study Conclusions  - Left ventricle: The cavity size was normal. Wall thickness was   increased in a pattern of severe LVH. Systolic function was   normal. The estimated ejection fraction was in the range of 50%   to 55%. Wall motion was normal; there were no regional wall   motion abnormalities. Doppler parameters are consistent with   abnormal left ventricular relaxation (grade 1 diastolic   dysfunction). Doppler parameters are consistent with high   ventricular filling pressure. - Aorta: The aorta was mildly dilated. - Mitral valve: Mildly calcified annulus. There was moderate   regurgitation. - Left atrium: The atrium was mildly to moderately dilated. - Right ventricle: The cavity size was mildly dilated. Wall   thickness was normal. - Right atrium: The atrium was mildly dilated.  ------------------------------------------------------------------- Study data:  No prior study was available for comparison.  Study status:  Routine.  Procedure:  The patient reported no pain pre or post test. Transthoracic echocardiography. Image quality was adequate.  Study completion:  There were no complications. Transthoracic echocardiography.  M-mode, complete 2D, spectral Doppler, and color Doppler.  Birthdate:  Patient birthdate: 11-05-1950.  Age:  Patient is 68 yr old.  Sex:  Gender: male. BMI: 32.1 kg/m^2.  Blood pressure:      130/82  Patient status: Outpatient.  Study date:  Study date: 05/09/2018. Study time: 10:46 AM.  Location:  Echo laboratory.  -------------------------------------------------------------------  ------------------------------------------------------------------- Left ventricle:  The cavity size was normal. Wall thickness  was increased in a pattern of severe LVH. Systolic function was normal. The estimated ejection fraction was in the range of 50% to 55%. Wall motion was normal; there were no regional wall motion abnormalities. Doppler parameters are consistent with abnormal left ventricular relaxation (grade 1 diastolic dysfunction). Doppler parameters are consistent with high ventricular filling pressure.   ------------------------------------------------------------------- Aortic valve:   Trileaflet; mildly thickened leaflets. Mobility was not restricted.  Doppler:  Transvalvular velocity was within the normal range. There was no stenosis. There was no regurgitation.   ------------------------------------------------------------------- Aorta:  The aorta was mildly dilated. Aortic root: The aortic root was normal in size.  ------------------------------------------------------------------- Mitral valve:   Mildly calcified annulus. Mobility was not restricted.  Doppler:  Transvalvular velocity was within the normal range. There was no evidence for stenosis. There was moderate regurgitation.    Valve area by pressure half-time: 2.65 cm^2. Indexed valve area by pressure half-time: 1.22 cm^2/m^2.    Peak gradient (D): 4 mm Hg.  ------------------------------------------------------------------- Left atrium:  The atrium was mildly to moderately dilated.  ------------------------------------------------------------------- Atrial septum:  The septum was normal.  ------------------------------------------------------------------- Pulmonary veins: Common pulmonary vein:  The  Doppler velocity and flow profile were normal.  ------------------------------------------------------------------- Right ventricle:  The cavity size was mildly dilated. Wall thickness was normal. Systolic function was normal.  ------------------------------------------------------------------- Pulmonic valve:    Structurally normal valve.   Cusp separation was normal.  Doppler:  Transvalvular velocity was within the normal range. There was no evidence for stenosis. There was no regurgitation.  ------------------------------------------------------------------- Tricuspid valve:   Structurally normal valve.    Doppler: Transvalvular velocity was within the normal range. There was no regurgitation.  ------------------------------------------------------------------- Pulmonary artery:   The main pulmonary artery was normal-sized. Systolic pressure was within the normal range.  ------------------------------------------------------------------- Right atrium:  The atrium was mildly dilated.  ------------------------------------------------------------------- Pericardium:  The pericardium was normal in appearance. There was no pericardial effusion.  ------------------------------------------------------------------- Systemic veins: Inferior vena cava: The vessel was normal in size. The respirophasic diameter changes were in the normal range (>= 50%), consistent with normal central venous pressure.  ------------------------------------------------------------------- Post procedure conclusions Ascending Aorta:  - The aorta was mildly dilated.  ------------------------------------------------------------------- Measurements   Left ventricle                           Value          Reference  LV ID, ED, PLAX chordal          (H)     54    mm       43 - 52  LV ID, ES, PLAX chordal          (H)     40    mm       23 - 38  LV fx shortening, PLAX chordal   (L)     26    %         >=29  LV PW thickness, ED                      20    mm       ----------  IVS/LV PW ratio, ED                      1              <=1.3  Stroke volume, 2D  82    ml       ----------  Stroke volume/bsa, 2D                    38    ml/m^2   ----------  LV ejection fraction, 1-p A4C            53    %        ----------  LV end-diastolic volume, 2-p             104   ml       ----------  LV end-systolic volume, 2-p              49    ml       ----------  LV ejection fraction, 2-p                53    %        ----------  Stroke volume, 2-p                       55    ml       ----------  LV end-diastolic volume/bsa, 2-p         48    ml/m^2   ----------  LV end-systolic volume/bsa, 2-p          23    ml/m^2   ----------  Stroke volume/bsa, 2-p                   25.3  ml/m^2   ----------  LV e&', lateral                           5.77  cm/s     ----------  LV E/e&', lateral                         17.31          ----------  LV e&', medial                            3.05  cm/s     ----------  LV E/e&', medial                          32.75          ----------  LV e&', average                           4.41  cm/s     ----------  LV E/e&', average                         22.65          ----------    Ventricular septum                       Value          Reference  IVS thickness, ED                        20    mm       ----------    LVOT  Value          Reference  LVOT ID, S                               22    mm       ----------  LVOT area                                3.8   cm^2     ----------  LVOT peak velocity, S                    100   cm/s     ----------  LVOT mean velocity, S                    72.3  cm/s     ----------  LVOT VTI, S                              21.5  cm       ----------  LVOT peak gradient, S                    4     mm Hg    ----------    Aorta                                    Value          Reference   Aortic root ID, ED                       40    mm       ----------  Ascending aorta ID, A-P, S               40    mm       ----------    Left atrium                              Value          Reference  LA ID, A-P, ES                           40    mm       ----------  LA ID/bsa, A-P                           1.84  cm/m^2   <=2.2  LA volume, S                             86.2  ml       ----------  LA volume/bsa, S                         39.7  ml/m^2   ----------  LA volume, ES, 1-p A4C                   78.3  ml       ----------  LA volume/bsa, ES, 1-p A4C               36    ml/m^2   ----------  LA volume, ES, 1-p A2C                   88.9  ml       ----------  LA volume/bsa, ES, 1-p A2C               40.9  ml/m^2   ----------    Mitral valve                             Value          Reference  Mitral E-wave peak velocity              99.9  cm/s     ----------  Mitral A-wave peak velocity              124   cm/s     ----------  Mitral deceleration time         (H)     285   ms       150 - 230  Mitral pressure half-time                83    ms       ----------  Mitral peak gradient, D                  4     mm Hg    ----------  Mitral E/A ratio, peak                   0.8            ----------  Mitral valve area, PHT, DP               2.65  cm^2     ----------  Mitral valve area/bsa, PHT, DP           1.22  cm^2/m^2 ----------    Right atrium                             Value          Reference  RA ID, S-I, ES, A4C              (H)     58.8  mm       34 - 49  RA area, ES, A4C                 (H)     21.6  cm^2     8.3 - 19.5  RA volume, ES, A/L                       70.7  ml       ----------  RA volume/bsa, ES, A/L                   32.5  ml/m^2   ----------    Systemic veins                           Value          Reference  Estimated CVP  3     mm Hg    ----------    Right ventricle                          Value          Reference  TAPSE                                     25.3  mm       ----------  RV s&', lateral, S                        17.8  cm/s     ----------  Legend: (L)  and  (H)  mark values outside specified reference range.  ------------------------------------------------------------------- Prepared and Electronically Authenticated by  Norman Herrlich, MD 2020-01-17T13:12:49    PRE-OP ECHO  Procedure Abnormality Status  ECHOCARDIOGRAM COMPLETE    ECHOCARDIOGRAM COMPLETE  Order #: [id number] Accession #: [id number]  Patient Info   Patient name: Jerry Myers  MRN: [id number]  Age: 68 y.o.  Sex: male  Vitals   BP Height Weight BSA (Calculated - sq m)  102/67  (1.727 m) 101 kg 2.15 sq meters  Study Result   Result status: Final result     ECHOCARDIOGRAM REPORT       Patient Name:   Jerry Myers Date of Exam: 09/03/2018 Medical Rec #:  [id number]          Height:       68.0 in Accession #:    [id number]         Weight:       212.0 lb Date of Birth:  May 28, 1950          BSA:          2.09 m Patient Age:    68 years           BP:           154/76 mmHg Patient Gender: M                  HR:           53 bpm. Exam Location:  Outpatient    Procedure: 2D Echo, Cardiac Doppler and Color Doppler  Indications:    CAD   History:        Patient has prior history of Echocardiogram examinations, most                 recent 05/09/2018. CAD Moderate MR Risk Factors: Diabetes,                 Hypertension and Dyslipidemia.   Sonographer:    Lavenia Atlas Referring Phys: [id number] PETER VAN TRIGT  IMPRESSIONS    1. The left ventricle has moderate-severely reduced systolic function, with an ejection fraction of 30-35%. Anterolateral, inferolateral, inferior severe hypokinesis. The cavity size was mildly dilated. There is moderately increased left ventricular  wall thickness. Left ventricular diastolic Doppler parameters are consistent with impaired relaxation.  2. The right ventricle has  normal systolic function. The cavity was normal. There is no increase in right ventricular wall thickness.  3. Left atrial size was mildly dilated.  4. Right atrial size was mildly dilated.  5. Mitral valve regurgitation is mild to moderate by color flow Doppler. No evidence of mitral valve stenosis.  6. The aortic valve is tricuspid. Mild calcification of the aortic valve. No stenosis of the aortic valve.  7. There is mild dilatation of the aortic root measuring 38 mm.  8. Normal IVC size. PA systolic pressure 26 mmHg.  FINDINGS  Left Ventricle: The left ventricle has moderate-severely reduced systolic function, with an ejection fraction of 30-35%. The cavity size was mildly dilated. There is moderately increased left ventricular wall thickness. Left ventricular diastolic  Doppler parameters are consistent with impaired relaxation.  Right Ventricle: The right ventricle has normal systolic function. The cavity was normal. There is no increase in right ventricular wall thickness.  Left Atrium: Left atrial size was mildly dilated.  Right Atrium: Right atrial size was mildly dilated. Right atrial pressure is estimated at 3 mmHg.  Interatrial Septum: No atrial level shunt detected by color flow Doppler.  Pericardium: There is no evidence of pericardial effusion.  Mitral Valve: The mitral valve is normal in structure. Mitral valve regurgitation is mild to moderate by color flow Doppler. No evidence of mitral valve stenosis.  Tricuspid Valve: The tricuspid valve is normal in structure. Tricuspid valve regurgitation is trivial by color flow Doppler.  Aortic Valve: The aortic valve is tricuspid Mild calcification of the aortic valve. Aortic valve regurgitation was not visualized by color flow Doppler. There is No stenosis of the aortic valve.  Pulmonic Valve: The pulmonic valve was grossly normal. Pulmonic valve regurgitation is not visualized by color flow Doppler.  Aorta: There is  mild dilatation of the aortic root measuring 38 mm.  Venous: The inferior vena cava is normal in size with greater than 50% respiratory variability.    +--------------+--------++ LEFT VENTRICLE              +----------------+---------++ +--------------+--------++      Diastology                PLAX 2D                     +----------------+---------++ +--------------+--------++      LV e' lateral:  7.51 cm/s LVIDd:        5.57 cm       +----------------+---------++ +--------------+--------++      LV E/e' lateral:10.6      LVIDs:        4.14 cm       +----------------+---------++ +--------------+--------++      LV e' medial:   4.90 cm/s LV PW:        1.63 cm       +----------------+---------++ +--------------+--------++      LV E/e' medial: 16.2      LV IVS:       1.58 cm       +----------------+---------++ +--------------+--------++ LVOT diam:    2.40 cm  +--------------+--------++ LV SV:        76 ml    +--------------+--------++ LV SV Index:  34.80    +--------------+--------++ LVOT Area:    4.52 cm +--------------+--------++                        +--------------+--------++   +------------------+---------++ LV Volumes (MOD)            +------------------+---------++ LV area d, A4C:   42.80 cm +------------------+---------++ LV area s, A4C:   28.10 cm +------------------+---------++ LV major d, A4C:  7.94 cm   +------------------+---------++ LV major s, A4C:  6.95 cm   +------------------+---------++ LV vol d, MOD A4C:192.0 ml  +------------------+---------++  LV vol s, MOD A4C:96.8 ml   +------------------+---------++ LV SV MOD A4C:    192.0 ml  +------------------+---------++  +---------------+----------++ RIGHT VENTRICLE           +---------------+----------++ RV Basal diam: 3.87 cm    +---------------+----------++ RV S prime:    23.20 cm/s  +---------------+----------++ TAPSE (M-mode):2.0 cm     +---------------+----------++ RVSP:          26.4 mmHg  +---------------+----------++  +---------------+-------++-----------++ LEFT ATRIUM           Index       +---------------+-------++-----------++ LA diam:       4.90 cm2.34 cm/m  +---------------+-------++-----------++ LA Vol (A2C):  42.4 ml20.24 ml/m +---------------+-------++-----------++ LA Vol (A4C):  60.3 ml28.78 ml/m +---------------+-------++-----------++ LA Biplane Vol:54.0 ml25.78 ml/m +---------------+-------++-----------++ +------------+---------++-----------++ RIGHT ATRIUM         Index       +------------+---------++-----------++ RA Pressure:3.00 mmHg            +------------+---------++-----------++ RA Area:    20.50 cm            +------------+---------++-----------++ RA Volume:  64.20 ml 30.64 ml/m +------------+---------++-----------++  +------------+-----------++ AORTIC VALVE            +------------+-----------++ LVOT Vmax:  103.00 cm/s +------------+-----------++ LVOT Vmean: 62.900 cm/s +------------+-----------++ LVOT VTI:   0.209 m     +------------+-----------++   +-------------+-------++ AORTA                +-------------+-------++ Ao Root diam:3.80 cm +-------------+-------++  +--------------+----------++  +---------------+-----------++ MITRAL VALVE              TRICUSPID VALVE            +--------------+----------++  +---------------+-----------++ MV Area (PHT):2.76 cm    TR Peak grad:  23.4 mmHg   +--------------+----------++  +---------------+-----------++ MV PHT:       79.75 msec  TR Vmax:       242.00 cm/s +--------------+----------++  +---------------+-----------++ MV Decel Time:275 msec    Estimated RAP: 3.00 mmHg   +--------------+----------++  +---------------+-----------++  +--------------+-----------++ RVSP:          26.4 mmHg   MV E velocity:79.50 cm/s  +---------------+-----------++ +--------------+-----------++ MV A velocity:108.00 cm/s +--------------+-------+ +--------------+-----------++ SHUNTS                MV E/A ratio: 0.74        +--------------+-------+ +--------------+-----------++ Systemic VTI: 0.21 m                                +--------------+-------+                               Systemic Diam:2.40 cm                               +--------------+-------+    Marca Anconaalton Mclean MD Electronically signed by Marca Anconaalton Mclean MD Signature Date/Time: 09/03/2018/4:47:56 PM       Final        Treatments: Surgery  OPERATIVE REPORT  DATE OF PROCEDURE:  09/05/2018  OPERATION: 1.  Coronary artery bypass grafting x4 (left internal mammary artery to left anterior descending, saphenous vein graft to OM1, saphenous vein graft to distal posterolateral circumflex, saphenous vein graft to posterior descending). 2.  Endoscopic harvest of right leg and left leg greater saphenous vein.  SURGEON:  Kerin Perna, MD  ASSISTANT:  Gershon Crane, PA-C.  ANESTHESIA:  General.  PREOPERATIVE DIAGNOSES:  Severe 3-vessel coronary artery disease, poorly managed diabetes, moderate left ventricular dysfunction, class III angina.  POSTOPERATIVE DIAGNOSES:  Severe 3-vessel coronary artery disease, poorly managed diabetes, moderate ventricular dysfunction, class III angina.  CLINICAL NOTE:  The patient is an obese diabetic with sedentary lifestyle who had previously undergone cardiac catheterization by Dr. Okey Dupre for symptoms of angina.  This was approximately 3-4 months ago.  At that time echo showed ejection fraction of 50%.   He was evaluated and felt to be a candidate for surgery based on his multivessel CAD and diabetes and symptoms.  However, before the surgery could be accomplished, the hospital policy for patient care change  due to the COVID-19 crisis.  Elective surgery  was postponed.  I followed the patient and when the COVID policies were altered recently, I evaluated the patient for surgery and felt he was still a candidate and would benefit from multivessel CABG.  A repeat echo however, showed his ejection fraction  had decreased to approximately 35%.  The patient still had exertional symptoms of angina, but no resting symptoms and no symptoms of CHF.  I discussed with him the benefits of surgery, the details of surgery including the location of the surgical  incisions, the use of general anesthesia and cardiopulmonary bypass, and the expected postoperative recovery.  He also understood the risks of surgery including the risks of stroke, bleeding, infection, postoperative pulmonary problems due to his  obesity, postoperative organ failure, and death.  He agreed to proceed with surgery.  Discharge Exam: Blood pressure 122/73, pulse 67, temperature 98.3 F (36.8 C), temperature source Oral, resp. rate 18, height 5\' 8"  (1.727 m), weight 94.3 kg, SpO2 93 %.  General appearance: alert, cooperative and no distress Heart: regular rate and rhythm and occ extrasystole Lungs: mildly dim in bases Abdomen: soft, non-tender Extremities: + LE edema Wound: incis healing well   Disposition: Discharge disposition: 01-Home or Self Care       Discharge Instructions    Discharge patient   Complete by:  As directed    Discharge disposition:  01-Home or Self Care   Discharge patient date:  09/12/2018     Allergies as of 09/12/2018   No Known Allergies     Medication List    STOP taking these medications   amLODipine 5 MG tablet Commonly known as:  NORVASC   doxycycline 100 MG tablet Commonly known as:  VIBRA-TABS   lisinopril 20 MG tablet Commonly known as:  ZESTRIL   meloxicam 7.5 MG tablet Commonly known as:  MOBIC   metoprolol tartrate 25 MG tablet Commonly known as:  LOPRESSOR   nitroGLYCERIN 0.4  MG SL tablet Commonly known as:  Nitrostat     TAKE these medications   aspirin EC 81 MG tablet Take 81 mg by mouth daily.   atorvastatin 20 MG tablet Commonly known as:  LIPITOR Take 20 mg by mouth daily.   carvedilol 3.125 MG tablet Commonly known as:  COREG Take 1 tablet (3.125 mg total) by mouth 2 (two) times daily with a meal.   clopidogrel 75 MG tablet Commonly known as:  PLAVIX Take 1 tablet (75 mg total) by mouth daily.   escitalopram 5 MG tablet Commonly known as:  LEXAPRO Take 5 mg by mouth daily.   gabapentin 300 MG capsule Commonly known as:  NEURONTIN Take 300 mg by mouth 2 (two) times daily.  glimepiride 1 MG tablet Commonly known as:  AMARYL Take 1 mg by mouth daily with breakfast.   hydrochlorothiazide 25 MG tablet Commonly known as:  HYDRODIURIL Take 25 mg by mouth daily.   metFORMIN 500 MG tablet Commonly known as:  GLUCOPHAGE Take 1 tablet (500 mg total) by mouth 2 (two) times daily.   oxyCODONE 5 MG immediate release tablet Commonly known as:  Oxy IR/ROXICODONE Take 1-2 tablets (5-10 mg total) by mouth every 6 (six) hours as needed for up to 7 days for severe pain.   potassium chloride SA 20 MEQ tablet Commonly known as:  K-DUR Take 2 tablets (40 mEq total) by mouth 2 (two) times daily.   sitaGLIPtin 100 MG tablet Commonly known as:  JANUVIA Take 100 mg by mouth daily.      Follow-up Information    [facility] [place]. Go on 10/06/2018.   Specialty:  Cardiothoracic Surgery Why:  You have an appointment with the phycisian assiatant at Dr. Zenaida Niece Trigt's office on Monday 6/15 at 1pm.  Please arrive 30 minutes early for a chest x-ray to be performed on the first floor of the same building. Contact information: [address] [place], [address] [place] [address] [phone]       Revankar, Aundra Dubin, MD Follow up.   Specialty:  Cardiology Why:  Dr. Jerrell Mylar office will contact you with  follow up instructions. Contact information: [address] [place] [address] [phone]          The patient has been discharged on:   1.Beta Blocker:  Yes [  y ]                              No   [   ]                              If No, reason:  2.Ace Inhibitor/ARB: Yes [   ]                                     No  [  n  ]                                     If No, reason:labile BP  3.Statin:   Yes [ y  ]                  No  [   ]                  If No, reason:  4.Ecasa:  Yes  [ y  ]                  No   [   ]                  If No, reason:    Signed: Rowe Clack, PA-C 09/12/2018, 7:51 AM

## 2018-09-06 NOTE — Progress Notes (Signed)
RT removed patient from BIPAP and placed on 15L HFNC salter. Patient is tolerating well at this time. No complications. RN at bedside. RT will continue to monitor.

## 2018-09-06 NOTE — Progress Notes (Signed)
1 Day Post-Op Procedure(s) (LRB): CORONARY ARTERY BYPASS GRAFTING (CABG) x4, ON PUMP, USING LEFT INTERNAL MAMMARY ARTERY AND RIGHT AND LEFT GREAT SAPHENOUS VEIN HARVESTED ENDOSCOPICALLY (N/A) TRANSESOPHAGEAL ECHOCARDIOGRAM (TEE) (N/A) Subjective: Patient extubated on high flow nasal cannula humidified oxygen Slow sinus rhythm now a paced Cardiac index greater than 2.5 Chest x-ray with low lung volumes and some edema Minimal chest tube drainage overnight  Objective: Vital signs in last 24 hours: Temp:  [97.3 F (36.3 C)-99 F (37.2 C)] 98.4 F (36.9 C) (05/16 1000) Pulse Rate:  [70-146] 88 (05/16 1000) Cardiac Rhythm: A-V Sequential paced (05/16 0400) Resp:  [14-34] 31 (05/16 1000) BP: (93-120)/(60-86) 112/74 (05/16 0900) SpO2:  [88 %-100 %] 95 % (05/16 1000) Arterial Line BP: (64-171)/(50-134) 99/53 (05/16 1000) FiO2 (%):  [40 %-100 %] 60 % (05/16 0507) Weight:  [101 kg] 101 kg (05/16 0500)  Hemodynamic parameters for last 24 hours: PAP: (16-49)/(8-24) 31/12 CO:  [4.2 L/min-516 L/min] 6 L/min CI:  [2 L/min/m2-3.6 L/min/m2] 2.9 L/min/m2  Intake/Output from previous day: 05/15 0701 - 05/16 0700 In: 5441.1 [I.V.:4066.1; Blood:200; IV Piggyback:1175] Out: 3555 [Urine:2556; Emesis/NG output:44; Blood:600; Chest Tube:355] Intake/Output this shift: Total I/O In: 258.2 [I.V.:182.4; IV Piggyback:75.8] Out: 415 [Chest Tube:415]      Physical Exam  General: Obese 68 year old male somewhat anxious after CABG yesterday HEENT: Normocephalic pupils equal , dentition adequate Neck: Supple without JVD, adenopathy, or bruit Chest: Diminished breath sounds with scattered rhonchi  Cardiovascular: Regular rate and rhythm, no murmur, no gallop, peripheral pulses             palpable in all extremities Abdomen:  Soft, nontender, no palpable mass or organomegaly Extremities: Warm, well-perfused, no clubbing cyanosis or tenderness, 2+ mild edema              no venous stasis changes of the  legs Rectal/GU: Deferred Neuro: Grossly non--focal and symmetrical throughout Skin: Clean and dry without rash or ulceration   Lab Results: Recent Labs    09/05/18 2056  09/06/18 0427 09/06/18 0505  WBC 13.3*  --  12.0*  --   HGB 9.8*   < > 8.6* 7.8*  HCT 28.7*   < > 25.6* 23.0*  PLT 171  --  157  --    < > = values in this interval not displayed.   BMET:  Recent Labs    09/03/18 1151  09/05/18 2056 09/05/18 2103  09/06/18 0427 09/06/18 0505  NA 139   < >  --  143   < > 142 143  K 3.8   < >  --  3.9   < > 3.7 3.7  CL 107  --   --   --   --  113*  --   CO2 20*  --   --   --   --  22  --   GLUCOSE 206*   < >  --  135*  --  123*  --   BUN 18  --   --   --   --  20  --   CREATININE 1.10  --  1.11  --   --  1.19  --   CALCIUM 9.5  --   --   --   --  8.1*  --    < > = values in this interval not displayed.    PT/INR:  Recent Labs    09/05/18 1517  LABPROT 17.4*  INR 1.4*   ABG  Component Value Date/Time   PHART 7.354 09/06/2018 0505   HCO3 20.4 09/06/2018 0505   TCO2 21 (L) 09/06/2018 0505   ACIDBASEDEF 5.0 (H) 09/06/2018 0505   O2SAT 95.0 09/06/2018 0505   CBG (last 3)  Recent Labs    09/06/18 0157 09/06/18 0257 09/06/18 0402  GLUCAP 164* 143* 115*    Assessment/Plan: S/P Procedure(s) (LRB): CORONARY ARTERY BYPASS GRAFTING (CABG) x4, ON PUMP, USING LEFT INTERNAL MAMMARY ARTERY AND RIGHT AND LEFT GREAT SAPHENOUS VEIN HARVESTED ENDOSCOPICALLY (N/A) TRANSESOPHAGEAL ECHOCARDIOGRAM (TEE) (N/A) Expected postoperative blood loss anemia.  Patient will receive 1 unit of packed cells Diuresis, mobilize out of bed, wean oxygen as tolerated Continue low-dose milrinone and norepinephrine for ischemic cardiomyopathy Twice daily Levemir and sliding scale for insulin coverage of diabetes  LOS: 1 day    Kathlee Nations Trigt III 09/06/2018

## 2018-09-06 NOTE — Progress Notes (Signed)
Informed respiratory of ABG. Patient on high flow at 15 with no changes made. IS and coughing and deep breathing with patient.

## 2018-09-07 ENCOUNTER — Inpatient Hospital Stay (HOSPITAL_COMMUNITY): Payer: Medicare Other

## 2018-09-07 LAB — CBC
HCT: 25.2 % — ABNORMAL LOW (ref 39.0–52.0)
Hemoglobin: 8.2 g/dL — ABNORMAL LOW (ref 13.0–17.0)
MCH: 29.7 pg (ref 26.0–34.0)
MCHC: 32.5 g/dL (ref 30.0–36.0)
MCV: 91.3 fL (ref 80.0–100.0)
Platelets: 142 10*3/uL — ABNORMAL LOW (ref 150–400)
RBC: 2.76 MIL/uL — ABNORMAL LOW (ref 4.22–5.81)
RDW: 14.3 % (ref 11.5–15.5)
WBC: 12 10*3/uL — ABNORMAL HIGH (ref 4.0–10.5)
nRBC: 0 % (ref 0.0–0.2)

## 2018-09-07 LAB — COMPREHENSIVE METABOLIC PANEL
ALT: 21 U/L (ref 0–44)
AST: 27 U/L (ref 15–41)
Albumin: 3.2 g/dL — ABNORMAL LOW (ref 3.5–5.0)
Alkaline Phosphatase: 33 U/L — ABNORMAL LOW (ref 38–126)
Anion gap: 9 (ref 5–15)
BUN: 30 mg/dL — ABNORMAL HIGH (ref 8–23)
CO2: 22 mmol/L (ref 22–32)
Calcium: 7.8 mg/dL — ABNORMAL LOW (ref 8.9–10.3)
Chloride: 109 mmol/L (ref 98–111)
Creatinine, Ser: 1.39 mg/dL — ABNORMAL HIGH (ref 0.61–1.24)
GFR calc Af Amer: 59 mL/min — ABNORMAL LOW (ref >60)
GFR calc non Af Amer: 51 mL/min — ABNORMAL LOW (ref >60)
Glucose, Bld: 190 mg/dL — ABNORMAL HIGH (ref 70–99)
Potassium: 3.7 mmol/L (ref 3.5–5.1)
Sodium: 140 mmol/L (ref 135–145)
Total Bilirubin: 0.8 mg/dL (ref 0.3–1.2)
Total Protein: 4.9 g/dL — ABNORMAL LOW (ref 6.5–8.1)

## 2018-09-07 LAB — GLUCOSE, CAPILLARY
Glucose-Capillary: 90 mg/dL (ref 70–99)
Glucose-Capillary: 130 mg/dL — ABNORMAL HIGH (ref 70–99)
Glucose-Capillary: 125 mg/dL — ABNORMAL HIGH (ref 70–99)
Glucose-Capillary: 107 mg/dL — ABNORMAL HIGH (ref 70–99)
Glucose-Capillary: 120 mg/dL — ABNORMAL HIGH (ref 70–99)
Glucose-Capillary: 122 mg/dL — ABNORMAL HIGH (ref 70–99)
Glucose-Capillary: 111 mg/dL — ABNORMAL HIGH (ref 70–99)
Glucose-Capillary: 105 mg/dL — ABNORMAL HIGH (ref 70–99)
Glucose-Capillary: 209 mg/dL — ABNORMAL HIGH (ref 70–99)
Glucose-Capillary: 223 mg/dL — ABNORMAL HIGH (ref 70–99)
Glucose-Capillary: 204 mg/dL — ABNORMAL HIGH (ref 70–99)
Glucose-Capillary: 183 mg/dL — ABNORMAL HIGH (ref 70–99)
Glucose-Capillary: 186 mg/dL — ABNORMAL HIGH (ref 70–99)
Glucose-Capillary: 233 mg/dL — ABNORMAL HIGH (ref 70–99)
Glucose-Capillary: 216 mg/dL — ABNORMAL HIGH (ref 70–99)
Glucose-Capillary: 174 mg/dL — ABNORMAL HIGH (ref 70–99)

## 2018-09-07 LAB — COOXEMETRY PANEL
Carboxyhemoglobin: 1.4 % (ref 0.5–1.5)
Methemoglobin: 1.1 % (ref 0.0–1.5)
O2 Saturation: 69.8 %
Total hemoglobin: 8.6 g/dL — ABNORMAL LOW (ref 12.0–16.0)

## 2018-09-07 LAB — POCT I-STAT 7, (LYTES, BLD GAS, ICA,H+H)
Acid-base deficit: 1 mmol/L (ref 0.0–2.0)
Bicarbonate: 22.9 mmol/L (ref 20.0–28.0)
Calcium, Ion: 1.12 mmol/L — ABNORMAL LOW (ref 1.15–1.40)
HCT: 22 % — ABNORMAL LOW (ref 39.0–52.0)
Hemoglobin: 7.5 g/dL — ABNORMAL LOW (ref 13.0–17.0)
O2 Saturation: 93 %
Patient temperature: 98.3
Potassium: 3.7 mmol/L (ref 3.5–5.1)
Sodium: 140 mmol/L (ref 135–145)
TCO2: 24 mmol/L (ref 22–32)
pCO2 arterial: 34.1 mmHg (ref 32.0–48.0)
pH, Arterial: 7.435 (ref 7.350–7.450)
pO2, Arterial: 64 mmHg — ABNORMAL LOW (ref 83.0–108.0)

## 2018-09-07 MED ORDER — CLOPIDOGREL BISULFATE 75 MG PO TABS
75.0000 mg | ORAL_TABLET | Freq: Every day | ORAL | Status: DC
  Administered 2018-09-07 – 2018-09-12 (×6): 75 mg via ORAL
  Filled 2018-09-07 (×6): qty 1

## 2018-09-07 MED ORDER — POTASSIUM CHLORIDE 10 MEQ/50ML IV SOLN
10.0000 meq | INTRAVENOUS | Status: AC
  Administered 2018-09-07 (×3): 10 meq via INTRAVENOUS
  Filled 2018-09-07 (×3): qty 50

## 2018-09-07 MED ORDER — SIMETHICONE 80 MG PO CHEW
80.0000 mg | CHEWABLE_TABLET | Freq: Four times a day (QID) | ORAL | Status: DC
  Administered 2018-09-07 – 2018-09-12 (×19): 80 mg via ORAL
  Filled 2018-09-07 (×21): qty 1

## 2018-09-07 MED ORDER — INSULIN DETEMIR 100 UNIT/ML ~~LOC~~ SOLN
20.0000 [IU] | Freq: Two times a day (BID) | SUBCUTANEOUS | Status: DC
  Administered 2018-09-07 – 2018-09-09 (×5): 20 [IU] via SUBCUTANEOUS
  Filled 2018-09-07 (×9): qty 0.2

## 2018-09-07 MED ORDER — METOCLOPRAMIDE HCL 5 MG/ML IJ SOLN
10.0000 mg | Freq: Four times a day (QID) | INTRAMUSCULAR | Status: AC
  Administered 2018-09-07 – 2018-09-08 (×6): 10 mg via INTRAVENOUS
  Filled 2018-09-07 (×6): qty 2

## 2018-09-07 MED ORDER — INSULIN ASPART 100 UNIT/ML ~~LOC~~ SOLN
6.0000 [IU] | Freq: Three times a day (TID) | SUBCUTANEOUS | Status: DC
  Administered 2018-09-08 – 2018-09-10 (×7): 6 [IU] via SUBCUTANEOUS

## 2018-09-07 MED ORDER — METOLAZONE 5 MG PO TABS
5.0000 mg | ORAL_TABLET | Freq: Once | ORAL | Status: AC
  Administered 2018-09-07: 5 mg via ORAL
  Filled 2018-09-07: qty 1

## 2018-09-07 MED ORDER — AMIODARONE HCL 200 MG PO TABS
200.0000 mg | ORAL_TABLET | Freq: Two times a day (BID) | ORAL | Status: DC
  Administered 2018-09-07 – 2018-09-08 (×3): 200 mg via ORAL
  Filled 2018-09-07 (×3): qty 1

## 2018-09-07 MED ORDER — INSULIN DETEMIR 100 UNIT/ML ~~LOC~~ SOLN
20.0000 [IU] | Freq: Two times a day (BID) | SUBCUTANEOUS | Status: DC
  Filled 2018-09-07: qty 0.2

## 2018-09-07 MED ORDER — ASPIRIN EC 81 MG PO TBEC
81.0000 mg | DELAYED_RELEASE_TABLET | Freq: Every day | ORAL | Status: DC
  Administered 2018-09-07 – 2018-09-12 (×6): 81 mg via ORAL
  Filled 2018-09-07 (×6): qty 1

## 2018-09-07 NOTE — Evaluation (Signed)
Physical Therapy Evaluation Patient Details Name: Jerry Myers MRN: [id number] DOB: [birth year] Today's Date: 09/07/2018   History of Present Illness  68 year old diabetic with hypertension and family history of CABG presents after recent cardiac catheterization by DrEndshowing three-vessel CAD in a diabetic pattern with moderate LV dysfunction by echo with EF 50% and moderate MR. Patient symptoms are dyspnea with exertion; now s/p CABG x4;  has a past medical history of Anxiety, Arthritis, Coronary artery disease, Coronary artery disease of native artery of native heart with stable angina pectoris (HCC) (03/18/2015), Depression, Dyspnea  Clinical Impression   Patient is s/p above surgery resulting in functional limitations due to the deficits listed below (see PT Problem List). Tells me he was independent prior to this admission, walkeing without assistive device, driving, grocery shopping; Presents with decr functional capacity, generalized weakness postop, decr activity tolerance, decr functional mobility; mr. Jerry Myers very much wants to dc home; he tells me he lives alone, but that he will make some calls to see if family and friends can provide him assist at home -- can his sister help him? Patient will benefit from skilled PT to increase their independence and safety with mobility to allow discharge to the venue listed below.    I am hopeful for good progress with functional independent so that he can go home -- will need near 24 hour assistance -- if slow progress, we must consider post-acute rehabilitation; Will place OT order per protocol for a closer look at ADLs and more input for dc planning.     Follow Up Recommendations Home health PT;Supervision/Assistance - 24 hour;Other (comment)(if slow progress, must consider post-acute rehab)    Equipment Recommendations  Rolling walker with 5" wheels;3in1 (PT)    Recommendations for Other Services OT consult(will order per protocol)      Precautions / Restrictions Precautions Precautions: Sternal;Fall Precaution Booklet Issued: No Precaution Comments: Explained and demonstrated sternal precautions; will need reinforcing Restrictions Weight Bearing Restrictions: No      Mobility  Bed Mobility Overal bed mobility: Needs Assistance Bed Mobility: Supine to Sit     Supine to sit: Mod assist;+2 for safety/equipment     General bed mobility comments: Mod assist to elevate trunk and use of bed pad to square off hips at EOB  Transfers Overall transfer level: Needs assistance Equipment used: Bilateral platform walker(Eva walker on 2H) Transfers: Sit to/from Stand Sit to Stand: Mod assist         General transfer comment: Light mod assist to power up  Ambulation/Gait Ambulation/Gait assistance: Min assist;Min guard;+2 safety/equipment(Lines/leads) Gait Distance (Feet): 110 Feet Assistive device: (Eva walker) Gait Pattern/deviations: Step-through pattern;Decreased step length - right;Decreased step length - left;Decreased stride length Gait velocity: slwoed   General Gait Details: Cues for more upright posture and deep breathing; noted tendency for short, faster breaths; 3 standing rest breaks during walk  Stairs            Wheelchair Mobility    Modified Rankin (Stroke Patients Only)       Balance Overall balance assessment: Needs assistance         Standing balance support: Bilateral upper extremity supported Standing balance-Leahy Scale: Poor Standing balance comment: Dependent on UE support                             Pertinent Vitals/Pain Pain Assessment: 0-10 Pain Score: 4  Pain Location: chest at op site Pain Descriptors / Indicators: Aching;Operative  site guarding Pain Intervention(s): Monitored during session    Home Living Family/patient expects to be discharged to:: Private residence Living Arrangements: Alone Available Help at Discharge: Other  (Comment)(tells me he needs to make some calls) Type of Home: House Home Access: Other (comment)(will need to verify; I believe one step to enter)     Home Layout: One level   Additional Comments: Per chart review, there is an option for him to go home with his sister Jerry Myers; He did not mention this on PT eval; Rn documented option to go to sister's on 70    Prior Function Level of Independence: Independent         Comments: still driving, grocery shopping prior to admission     Hand Dominance        Extremity/Trunk Assessment   Upper Extremity Assessment Upper Extremity Assessment: Generalized weakness    Lower Extremity Assessment Lower Extremity Assessment: Generalized weakness       Communication   Communication: HOH;Other (comment)(or difficulty understanding masked caregivers)  Cognition Arousal/Alertness: Awake/alert Behavior During Therapy: WFL for tasks assessed/performed Overall Cognitive Status: (for simple mobility tasks)                                 General Comments: noted documentation of option to stay with his sister (by RN on 68); He himself did not mention this option on PT eval      General Comments General comments (skin integrity, edema, etc.): Session conducted on 12 L supplemental O2 via HFNC; Pre-walking, vitals: BP 118/78, HR 88, O2 sats 96%; Post-walking BP 143/90, HR 88, O2 sats 97%; RR 33 upon return to room, decr to 24 with cues and seated rest    Exercises     Assessment/Plan    PT Assessment Patient needs continued PT services  PT Problem List Decreased strength;Decreased range of motion;Decreased activity tolerance;Decreased balance;Decreased mobility;Decreased coordination;Decreased cognition;Decreased knowledge of use of DME;Decreased safety awareness;Decreased knowledge of precautions;Cardiopulmonary status limiting activity;Pain;Obesity       PT Treatment Interventions DME instruction;Gait training;Stair  training;Functional mobility training;Therapeutic activities;Therapeutic exercise;Balance training;Patient/family education    PT Goals (Current goals can be found in the Care Plan section)  Acute Rehab PT Goals Patient Stated Goal: REALLY wants to dc to his home PT Goal Formulation: With patient Time For Goal Achievement: 09/21/18 Potential to Achieve Goals: Good    Frequency Min 3X/week   Barriers to discharge Decreased caregiver support Will need to verify help at home to solidify dc plan for home; Can he go to his sister's?    Co-evaluation               AM-PAC PT "6 Clicks" Mobility  Outcome Measure Help needed turning from your back to your side while in a flat bed without using bedrails?: A Lot Help needed moving from lying on your back to sitting on the side of a flat bed without using bedrails?: A Lot Help needed moving to and from a bed to a chair (including a wheelchair)?: A Lot Help needed standing up from a chair using your arms (e.g., wheelchair or bedside chair)?: A Lot Help needed to walk in hospital room?: A Little Help needed climbing 3-5 steps with a railing? : A Lot 6 Click Score: 13    End of Session Equipment Utilized During Treatment: Oxygen Activity Tolerance: Patient tolerated treatment well;Patient limited by fatigue Patient left: in chair;with call bell/phone within  reach;with nursing/sitter in room Nurse Communication: Mobility status PT Visit Diagnosis: Unsteadiness on feet (R26.81);Other abnormalities of gait and mobility (R26.89);Muscle weakness (generalized) (M62.81);Other (comment)(decr functional capacity)    Time: 9604-5409 PT Time Calculation (min) (ACUTE ONLY): 25 min   Charges:   PT Evaluation $PT Eval Moderate Complexity: 1 Mod PT Treatments $Gait Training: 8-22 mins        Van Clines, PT  Acute Rehabilitation Services Pager [phone] Office [phone]   Levi Aland 09/07/2018, 10:17 AM

## 2018-09-07 NOTE — Progress Notes (Signed)
2 Days Post-Op Procedure(s) (LRB): CORONARY ARTERY BYPASS GRAFTING (CABG) x4, ON PUMP, USING LEFT INTERNAL MAMMARY ARTERY AND RIGHT AND LEFT GREAT SAPHENOUS VEIN HARVESTED ENDOSCOPICALLY (N/A) TRANSESOPHAGEAL ECHOCARDIOGRAM (TEE) (N/A) Subjective: Getting stonger nsr on amiodarone Wt still up 10 ibs Objective: Vital signs in last 24 hours: Temp:  [97.6 F (36.4 C)-98.9 F (37.2 C)] 97.6 F (36.4 C) (05/17 0700) Pulse Rate:  [86-89] 88 (05/17 0800) Cardiac Rhythm: A-V Sequential paced (05/17 0800) Resp:  [16-33] 29 (05/17 0800) BP: (94-126)/(65-84) 116/78 (05/17 0800) SpO2:  [91 %-96 %] 95 % (05/17 0800) Arterial Line BP: (80-131)/(43-61) 131/60 (05/17 0800) Weight:  [101 kg] 101 kg (05/17 0600)  Hemodynamic parameters for last 24 hours: PAP: (31)/(12-13) 31/12  Intake/Output from previous day: 05/16 0701 - 05/17 0700 In: 1556.4 [P.O.:240; I.V.:1040.5; IV Piggyback:275.9] Out: 2095 [Urine:1915; Chest Tube:180] Intake/Output this shift: No intake/output data recorded.       Exam    General- alert and comfortable    Neck- no JVD, no cervical adenopathy palpable, no carotid bruit   Lungs- clear without rales, wheezes   Cor- regular rate and rhythm, no murmur , gallop   Abdomen- soft, non-tender   Extremities - warm, non-tender, minimal edema   Neuro- oriented, appropriate, no focal weakness   Lab Results: Recent Labs    09/06/18 1619  09/07/18 0435 09/07/18 0440  WBC 15.1*  --  12.0*  --   HGB 8.8*   < > 8.2* 7.5*  HCT 26.4*   < > 25.2* 22.0*  PLT 149*  --  142*  --    < > = values in this interval not displayed.   BMET:  Recent Labs    09/06/18 1619  09/07/18 0435 09/07/18 0440  NA 138   < > 140 140  K 4.1   < > 3.7 3.7  CL 110  --  109  --   CO2 20*  --  22  --   GLUCOSE 218*  --  190*  --   BUN 24*  --  30*  --   CREATININE 1.36*  --  1.39*  --   CALCIUM 7.8*  --  7.8*  --    < > = values in this interval not displayed.    PT/INR:  Recent Labs    09/05/18 1517  LABPROT 17.4*  INR 1.4*   ABG    Component Value Date/Time   PHART 7.435 09/07/2018 0440   HCO3 22.9 09/07/2018 0440   TCO2 24 09/07/2018 0440   ACIDBASEDEF 1.0 09/07/2018 0440   O2SAT 69.8 09/07/2018 0450   CBG (last 3)  Recent Labs    09/07/18 0134 09/07/18 0414 09/07/18 0744  GLUCAP 183* 186* 233*    Assessment/Plan: S/P Procedure(s) (LRB): CORONARY ARTERY BYPASS GRAFTING (CABG) x4, ON PUMP, USING LEFT INTERNAL MAMMARY ARTERY AND RIGHT AND LEFT GREAT SAPHENOUS VEIN HARVESTED ENDOSCOPICALLY (N/A) TRANSESOPHAGEAL ECHOCARDIOGRAM (TEE) (N/A) Transition to po amiodarone Cont diuresis, ambulation Milrinone to .125 mcg  LOS: 2 days    Jerry Myers 09/07/2018

## 2018-09-07 NOTE — Addendum Note (Signed)
Addendum  created 09/07/18 0751 by [name], MD   Clinical Note Signed

## 2018-09-07 NOTE — Progress Notes (Signed)
Anesthesiology Follow-up:  Awake, mildly confused, otherwise neurologically intact,  sitting up in bed. Still low dose milrinone. Creatinine up from baseline but stable.   VS: T- 37.2 BP- 104/67 HR- 88 (a-paced) RR- 10 O2 Sat- 96% on nasal O2  K-3.7 BUN/Cr.- 30/1.39 glucose- 217 H/H- 7.5/22.0 Platelets- 142,000  Extubated 7 hours post-op  68 year old male 2 days S/P CABG X 4 for diffuse CAD with ischemic cardiomyopathy.  Doing well overall. Plan to increase activity as tolerated.  [name]

## 2018-09-08 ENCOUNTER — Encounter (HOSPITAL_COMMUNITY): Payer: Self-pay | Admitting: Cardiothoracic Surgery

## 2018-09-08 ENCOUNTER — Inpatient Hospital Stay (HOSPITAL_COMMUNITY): Payer: Medicare Other

## 2018-09-08 ENCOUNTER — Telehealth: Payer: Self-pay | Admitting: Cardiology

## 2018-09-08 LAB — COMPREHENSIVE METABOLIC PANEL
ALT: 23 U/L (ref 0–44)
AST: 20 U/L (ref 15–41)
Albumin: 3.2 g/dL — ABNORMAL LOW (ref 3.5–5.0)
Alkaline Phosphatase: 39 U/L (ref 38–126)
Anion gap: 9 (ref 5–15)
BUN: 27 mg/dL — ABNORMAL HIGH (ref 8–23)
CO2: 26 mmol/L (ref 22–32)
Calcium: 8 mg/dL — ABNORMAL LOW (ref 8.9–10.3)
Chloride: 103 mmol/L (ref 98–111)
Creatinine, Ser: 1.22 mg/dL (ref 0.61–1.24)
GFR calc Af Amer: >60 mL/min (ref >60)
GFR calc non Af Amer: 60 mL/min — ABNORMAL LOW (ref >60)
Glucose, Bld: 141 mg/dL — ABNORMAL HIGH (ref 70–99)
Potassium: 2.8 mmol/L — ABNORMAL LOW (ref 3.5–5.1)
Sodium: 138 mmol/L (ref 135–145)
Total Bilirubin: 0.7 mg/dL (ref 0.3–1.2)
Total Protein: 5.8 g/dL — ABNORMAL LOW (ref 6.5–8.1)

## 2018-09-08 LAB — GLUCOSE, CAPILLARY
Glucose-Capillary: 152 mg/dL — ABNORMAL HIGH (ref 70–99)
Glucose-Capillary: 209 mg/dL — ABNORMAL HIGH (ref 70–99)
Glucose-Capillary: 242 mg/dL — ABNORMAL HIGH (ref 70–99)
Glucose-Capillary: 138 mg/dL — ABNORMAL HIGH (ref 70–99)
Glucose-Capillary: 141 mg/dL — ABNORMAL HIGH (ref 70–99)
Glucose-Capillary: 189 mg/dL — ABNORMAL HIGH (ref 70–99)
Glucose-Capillary: 148 mg/dL — ABNORMAL HIGH (ref 70–99)
Glucose-Capillary: 122 mg/dL — ABNORMAL HIGH (ref 70–99)

## 2018-09-08 LAB — MAGNESIUM: Magnesium: 2 mg/dL (ref 1.7–2.4)

## 2018-09-08 LAB — CBC
HCT: 27.5 % — ABNORMAL LOW (ref 39.0–52.0)
Hemoglobin: 9.1 g/dL — ABNORMAL LOW (ref 13.0–17.0)
MCH: 29.8 pg (ref 26.0–34.0)
MCHC: 33.1 g/dL (ref 30.0–36.0)
MCV: 90.2 fL (ref 80.0–100.0)
Platelets: 168 10*3/uL (ref 150–400)
RBC: 3.05 MIL/uL — ABNORMAL LOW (ref 4.22–5.81)
RDW: 14.2 % (ref 11.5–15.5)
WBC: 11 10*3/uL — ABNORMAL HIGH (ref 4.0–10.5)
nRBC: 0.2 % (ref 0.0–0.2)

## 2018-09-08 LAB — COOXEMETRY PANEL
Carboxyhemoglobin: 1.3 % (ref 0.5–1.5)
Carboxyhemoglobin: 1.1 % (ref 0.5–1.5)
Methemoglobin: 1.2 % (ref 0.0–1.5)
Methemoglobin: 1.7 % — ABNORMAL HIGH (ref 0.0–1.5)
O2 Saturation: 60.3 %
O2 Saturation: 56.4 %
Total hemoglobin: 11.6 g/dL — ABNORMAL LOW (ref 12.0–16.0)
Total hemoglobin: 12.8 g/dL (ref 12.0–16.0)

## 2018-09-08 LAB — POTASSIUM: Potassium: 3 mmol/L — ABNORMAL LOW (ref 3.5–5.1)

## 2018-09-08 MED ORDER — INSULIN ASPART 100 UNIT/ML ~~LOC~~ SOLN: 0.0000 [IU] | Freq: Every day | SUBCUTANEOUS | Status: DC

## 2018-09-08 MED ORDER — POTASSIUM CHLORIDE 10 MEQ/50ML IV SOLN
10.0000 meq | INTRAVENOUS | Status: AC
  Administered 2018-09-08 (×3): 10 meq via INTRAVENOUS
  Filled 2018-09-08 (×3): qty 50

## 2018-09-08 MED ORDER — SORBITOL 70 % PO SOLN
60.0000 mL | Freq: Once | ORAL | Status: DC
  Filled 2018-09-08: qty 60

## 2018-09-08 MED ORDER — METOLAZONE 5 MG PO TABS
5.0000 mg | ORAL_TABLET | Freq: Once | ORAL | Status: AC
  Administered 2018-09-08: 5 mg via ORAL
  Filled 2018-09-08: qty 1

## 2018-09-08 MED ORDER — INSULIN ASPART 100 UNIT/ML ~~LOC~~ SOLN: 6.0000 [IU] | Freq: Three times a day (TID) | SUBCUTANEOUS | Status: DC

## 2018-09-08 MED ORDER — POTASSIUM CHLORIDE CRYS ER 20 MEQ PO TBCR
40.0000 meq | EXTENDED_RELEASE_TABLET | Freq: Four times a day (QID) | ORAL | Status: AC
  Administered 2018-09-08 (×2): 40 meq via ORAL
  Filled 2018-09-08 (×2): qty 2

## 2018-09-08 MED ORDER — CHLORHEXIDINE GLUCONATE CLOTH 2 % EX PADS
6.0000 | MEDICATED_PAD | Freq: Every day | CUTANEOUS | Status: DC
  Administered 2018-09-08 – 2018-09-09 (×2): 6 via TOPICAL

## 2018-09-08 MED ORDER — CHLORHEXIDINE GLUCONATE 0.12 % MT SOLN
OROMUCOSAL | Status: AC
  Administered 2018-09-08: 15 mL via OROMUCOSAL
  Filled 2018-09-08: qty 15

## 2018-09-08 MED ORDER — FUROSEMIDE 10 MG/ML IJ SOLN
40.0000 mg | Freq: Every day | INTRAMUSCULAR | Status: DC
  Administered 2018-09-09 – 2018-09-10 (×2): 40 mg via INTRAVENOUS
  Filled 2018-09-08 (×2): qty 4

## 2018-09-08 MED ORDER — SODIUM CHLORIDE 0.9% FLUSH: 10.0000 mL | INTRAVENOUS | Status: DC | PRN

## 2018-09-08 MED ORDER — INSULIN ASPART 100 UNIT/ML ~~LOC~~ SOLN
0.0000 [IU] | Freq: Three times a day (TID) | SUBCUTANEOUS | Status: DC
  Administered 2018-09-08: 3 [IU] via SUBCUTANEOUS
  Administered 2018-09-08 – 2018-09-09 (×2): 2 [IU] via SUBCUTANEOUS
  Administered 2018-09-09 (×2): 3 [IU] via SUBCUTANEOUS
  Administered 2018-09-10: 2 [IU] via SUBCUTANEOUS
  Administered 2018-09-10: 12:00:00 8 [IU] via SUBCUTANEOUS
  Administered 2018-09-10: 3 [IU] via SUBCUTANEOUS
  Administered 2018-09-11: 5 [IU] via SUBCUTANEOUS
  Administered 2018-09-11: 3 [IU] via SUBCUTANEOUS
  Administered 2018-09-11 – 2018-09-12 (×2): 2 [IU] via SUBCUTANEOUS

## 2018-09-08 MED ORDER — CHLORHEXIDINE GLUCONATE 0.12 % MT SOLN
OROMUCOSAL | Status: AC
  Filled 2018-09-08: qty 15

## 2018-09-08 MED ORDER — SODIUM CHLORIDE 0.9% FLUSH
10.0000 mL | Freq: Two times a day (BID) | INTRAVENOUS | Status: DC
  Administered 2018-09-09: 10:00:00 10 mL

## 2018-09-08 MED ORDER — LEVALBUTEROL HCL 0.63 MG/3ML IN NEBU
0.6300 mg | INHALATION_SOLUTION | Freq: Three times a day (TID) | RESPIRATORY_TRACT | Status: DC
  Administered 2018-09-08 – 2018-09-10 (×7): 0.63 mg via RESPIRATORY_TRACT
  Filled 2018-09-08 (×10): qty 3

## 2018-09-08 NOTE — Progress Notes (Signed)
[facility] DAILY ICU PROGRESS NOTE                   [address]            [place] [address]          [phone]   3 Days Post-Op Procedure(s) (LRB): CORONARY ARTERY BYPASS GRAFTING (CABG) x4, ON PUMP, USING LEFT INTERNAL MAMMARY ARTERY AND RIGHT AND LEFT GREAT SAPHENOUS VEIN HARVESTED ENDOSCOPICALLY (N/A) TRANSESOPHAGEAL ECHOCARDIOGRAM (TEE) (N/A)  Total Length of Stay:  LOS: 3 days   Subjective: Up in the bedside chair, mental status appropriate. Reports some gas discomfort, pain is controlled.  Good diuresis past 24 hours, remains ~5lbs above pre-op weight.  Objective: Vital signs in last 24 hours: Temp:  [97.5 F (36.4 C)-98.8 F (37.1 C)] 98.8 F (37.1 C) (05/17 2355) Pulse Rate:  [87-90] 87 (05/18 0700) Cardiac Rhythm: Atrial paced (05/18 0400) Resp:  [19-31] 21 (05/18 0700) BP: (104-144)/(66-91) 128/73 (05/18 0700) SpO2:  [89 %-100 %] 94 % (05/18 0700) Arterial Line BP: (131)/(60) 131/60 (05/17 0800) Weight:  [98.7 kg] 98.7 kg (05/18 0436)  Filed Weights   09/06/18 0500 09/07/18 0600 09/08/18 0436  Weight: 101 kg 101 kg 98.7 kg    Weight change: -2.3 kg   Hemodynamic parameters for last 24 hours:    Intake/Output from previous day: 05/17 0701 - 05/18 0700 In: 1203.2 [P.O.:840; I.V.:232.9; IV Piggyback:130.2] Out: 4700 [Urine:4700]  Intake/Output this shift: No intake/output data recorded.  Current Meds: Scheduled Meds: . acetaminophen  1,000 mg Oral Q6H   Or  . acetaminophen (TYLENOL) oral liquid 160 mg/5 mL  1,000 mg Per Tube Q6H  . amiodarone  200 mg Oral BID  . aspirin EC  81 mg Oral Daily  . bisacodyl  10 mg Oral Daily   Or  . bisacodyl  10 mg Rectal Daily  . chlorhexidine gluconate (MEDLINE KIT)  15 mL Mouth Rinse BID  . clopidogrel  75 mg Oral Daily  . docusate sodium  200 mg Oral Daily  . enoxaparin (LOVENOX) injection  40 mg Subcutaneous QHS  . furosemide  40 mg Intravenous BID  . insulin aspart  0-24 Units Subcutaneous  Q4H  . insulin aspart  6 Units Subcutaneous TID WC  . insulin detemir  20 Units Subcutaneous BID  . levalbuterol  0.63 mg Nebulization TID  . mouth rinse  15 mL Mouth Rinse BID  . metoCLOPramide (REGLAN) injection  10 mg Intravenous Q6H  . metolazone  5 mg Oral Once  . metoprolol tartrate  12.5 mg Oral BID   Or  . metoprolol tartrate  12.5 mg Per Tube BID  . pantoprazole  40 mg Oral Daily  . potassium chloride  40 mEq Oral Q6H  . simethicone  80 mg Oral QID  . sodium chloride flush  3 mL Intravenous Q12H   Continuous Infusions: . sodium chloride Stopped (09/06/18 0636)  . sodium chloride    . sodium chloride 10 mL/hr at 09/08/18 0604  . lactated ringers 20 mL/hr (09/05/18 1542)  . milrinone 0.125 mcg/kg/min (09/08/18 0700)  . potassium chloride 10 mEq (09/08/18 0717)   PRN Meds:.sodium chloride, acetaminophen, metoprolol tartrate, midazolam, ondansetron (ZOFRAN) IV, oxyCODONE, sodium chloride flush, traMADol  General appearance: alert, cooperative and no distress Neurologic: No gros deficits Heart: Paced AAI at 88. Underlying SR with PVC's at ~70/min Lungs: reported to have some wheezes earlier, breath sounds clear but shallow now. Non-productive cough. Abdomen: soft and non-tender. Extremities:  Mild LE edema. Wound: The sternotomy is covered with an Aquacel derssing. EVH incisions intact and dry.  Lab Results: CBC: Recent Labs    09/07/18 0435 09/07/18 0440 09/08/18 0430  WBC 12.0*  --  11.0*  HGB 8.2* 7.5* 9.1*  HCT 25.2* 22.0* 27.5*  PLT 142*  --  168   BMET:  Recent Labs    09/07/18 0435 09/07/18 0440 09/08/18 0430  NA 140 140 138  K 3.7 3.7 2.8*  CL 109  --  103  CO2 22  --  26  GLUCOSE 190*  --  141*  BUN 30*  --  27*  CREATININE 1.39*  --  1.22  CALCIUM 7.8*  --  8.0*    CMET: Lab Results  Component Value Date   WBC 11.0 (H) 09/08/2018   HGB 9.1 (L) 09/08/2018   HCT 27.5 (L) 09/08/2018   PLT 168 09/08/2018   GLUCOSE 141 (H) 09/08/2018   CHOL  123 06/20/2018   TRIG 124 06/20/2018   HDL 42 06/20/2018   LDLCALC 60 06/20/2018   ALT 23 09/08/2018   AST 20 09/08/2018   NA 138 09/08/2018   K 2.8 (L) 09/08/2018   CL 103 09/08/2018   CREATININE 1.22 09/08/2018   BUN 27 (H) 09/08/2018   CO2 26 09/08/2018   INR 1.4 (H) 09/05/2018   HGBA1C 9.2 (H) 09/03/2018      PT/INR:  Recent Labs    09/05/18 1517  LABPROT 17.4*  INR 1.4*   Radiology: Dg Chest Port 1 View  Result Date: 09/08/2018 CLINICAL DATA:  Sore chest after CABG EXAM: PORTABLE CHEST 1 VIEW COMPARISON:  Age yesterday FINDINGS: Right IJ sheath in stable position. CABG with stable cardiomegaly. Patchy atelectatic opacity. Probable small pleural effusions. No pneumothorax. IMPRESSION: Stable atelectasis.  No pneumothorax. Electronically Signed   By: Monte Fantasia M.D.   On: 09/08/2018 07:21     Assessment/Plan: S/P Procedure(s) (LRB): CORONARY ARTERY BYPASS GRAFTING (CABG) x4, ON PUMP, USING LEFT INTERNAL MAMMARY ARTERY AND RIGHT AND LEFT GREAT SAPHENOUS VEIN HARVESTED ENDOSCOPICALLY (N/A) TRANSESOPHAGEAL ECHOCARDIOGRAM (TEE) (N/A)  -Postop day 3 CABG x4 for multivessel coronary artery disease and preoperative cardiomyopathy.  Stable hemodynamically with good perfusion.  Wean remaining milrinone and check co-oximetry later this morning.  Continue aspirin, statin, beta-blocker, and Plavix.  -Expected postoperative respiratory insufficiency with volume overload.  Excellent response to diuretic over the past 24 hours with Lasix and Zaroxolyn.  Weight remains about 5 pounds above preop.  Renal function is stable so will diurese aggressively for another 24 hours.  Continue to encourage pulmonary hygiene.  Add Xopenex.  Wean oxygen per protocol.  -Hypokalemia-IV and oral supplements ordered. Repeat serum potassium as well as magnesium at 10 AM and continue replacement as required.  -Atrial flutter, early postop.-Initially managed with amiodarone drip that has been converted  to oral amiodarone..  Currently atrially paced with appropriate capture.  He has underlying sinus rhythm in the low 70s.  -Expected acute blood loss anemia-hematocrit is stable.  Continue to monitor  -Type 2 diabetes mellitus--glucose control is adequate on sliding scale insulin coverage.  Continue to monitor  -DVT prophylaxis-continue enoxaparin subcu daily    Antony Odea, PA-C [phone] 09/08/2018 7:37 AM

## 2018-09-08 NOTE — Progress Notes (Signed)
[facility] BRIEF SICU PROGRESS NOTE  3 Days Post-Op  S/P Procedure(s) (LRB): CORONARY ARTERY BYPASS GRAFTING (CABG) x4, ON PUMP, USING LEFT INTERNAL MAMMARY ARTERY AND RIGHT AND LEFT GREAT SAPHENOUS VEIN HARVESTED ENDOSCOPICALLY (N/A) TRANSESOPHAGEAL ECHOCARDIOGRAM (TEE) (N/A)   Stable day NSR w/ stable BP coox stable 56% Breathing comfortably w/ O2 down to 4 L/min via [place] Diuresing well  Plan: Continue current plan  Purcell Nails, MD 09/08/2018 5:03 PM

## 2018-09-08 NOTE — Progress Notes (Signed)
Physical Therapy Treatment Patient Details Name: Jerry Myers MRN: [id number] DOB: [birth year] Today's Date: 09/08/2018    History of Present Illness 68 year old diabetic with hypertension and family history of CABG presents after recent cardiac catheterization by DrEndshowing three-vessel CAD in a diabetic pattern with moderate LV dysfunction by echo with EF 50% and moderate MR. Patient symptoms are dyspnea with exertion; now s/p CABG x4;  has a past medical history of Anxiety, Arthritis, Coronary artery disease, Coronary artery disease of native artery of native heart with stable angina pectoris (HCC) (03/18/2015), Depression, Dyspnea    PT Comments    Pt progressing well. Pt cont to have temporary pacemaker and is on 10LO2 via [place]. Pt with some wheezing during ambulation but was able to double ambulation distance today with RW instead of eva walker. Pt very motivated to return home. Acute PT to cont to follow.   Follow Up Recommendations  Home health PT;Supervision/Assistance - 24 hour;Other (comment)     Equipment Recommendations  Rolling walker with 5" wheels;3in1 (PT)    Recommendations for Other Services OT consult     Precautions / Restrictions Precautions Precautions: Sternal;Fall Precaution Booklet Issued: No Precaution Comments: re-educated on the sternal precautions, pt with verbal understanding but requires verbal cues during function Restrictions Weight Bearing Restrictions: Yes(sternal)    Mobility  Bed Mobility Overal bed mobility: Needs Assistance Bed Mobility: Sit to Supine       Sit to supine: Mod assist   General bed mobility comments: modA for LE elevation back into bed and to bring shoulders to center of bed  Transfers Overall transfer level: Needs assistance Equipment used: Rolling walker (2 wheeled) Transfers: Sit to/from Stand Sit to Stand: Mod assist         General transfer comment: light mod assist to power up due to sternal  precautions  Ambulation/Gait Ambulation/Gait assistance: Min assist;+2 safety/equipment Gait Distance (Feet): 250 Feet Assistive device: Rolling walker (2 wheeled) Gait Pattern/deviations: Step-through pattern;Decreased step length - right;Decreased step length - left;Decreased stride length Gait velocity: slow Gait velocity interpretation: <1.31 ft/sec, indicative of household ambulator General Gait Details: v/c's to increase step height and length, with increased distance pt demo'd increased step length and cadance, pt with 2 standing rest breaks, SpO2 >95% on 10Lo2 via [place]   Stairs             Wheelchair Mobility    Modified Rankin (Stroke Patients Only)       Balance Overall balance assessment: Needs assistance Sitting-balance support: No upper extremity supported;Feet supported Sitting balance-Leahy Scale: Fair     Standing balance support: Bilateral upper extremity supported Standing balance-Leahy Scale: Poor Standing balance comment: Dependent on UE support                            Cognition Arousal/Alertness: Awake/alert Behavior During Therapy: WFL for tasks assessed/performed Overall Cognitive Status: Within Functional Limits for tasks assessed                                        Exercises      General Comments General comments (skin integrity, edema, etc.): sternal incision covered with dressing, no drainage noted      Pertinent Vitals/Pain Pain Assessment: 0-10 Pain Score: 4  Pain Location: chest at op site Pain Descriptors / Indicators: Aching;Operative site guarding Pain Intervention(s): Monitored during session  Home Living                      Prior Function            PT Goals (current goals can now be found in the care plan section) Acute Rehab PT Goals Patient Stated Goal: home Progress towards PT goals: Progressing toward goals    Frequency    Min 3X/week      PT Plan Current  plan remains appropriate    Co-evaluation              AM-PAC PT "6 Clicks" Mobility   Outcome Measure  Help needed turning from your back to your side while in a flat bed without using bedrails?: A Lot Help needed moving from lying on your back to sitting on the side of a flat bed without using bedrails?: A Lot Help needed moving to and from a bed to a chair (including a wheelchair)?: A Lot Help needed standing up from a chair using your arms (e.g., wheelchair or bedside chair)?: A Lot Help needed to walk in hospital room?: A Little Help needed climbing 3-5 steps with a railing? : A Lot 6 Click Score: 13    End of Session Equipment Utilized During Treatment: Oxygen Activity Tolerance: Patient tolerated treatment well;Patient limited by fatigue Patient left: in bed;with call bell/phone within reach;with nursing/sitter in room Nurse Communication: Mobility status PT Visit Diagnosis: Unsteadiness on feet (R26.81);Other abnormalities of gait and mobility (R26.89);Muscle weakness (generalized) (M62.81);Other (comment)     Time: 7858-8502 PT Time Calculation (min) (ACUTE ONLY): 29 min  Charges:  $Gait Training: 23-37 mins                     Lewis Shock, PT, DPT Acute Rehabilitation Services Pager #: [phone] Office #: [phone]    Iona Hansen 09/08/2018, 9:50 AM

## 2018-09-08 NOTE — Telephone Encounter (Signed)
Called to schedule hos follow up, no answer, will continue efforts

## 2018-09-08 NOTE — Progress Notes (Signed)
OT Cancellation Note  Patient Details Name: Jerry Myers MRN: [id number] DOB: 1950/05/13   Cancelled Treatment:    Reason Eval/Treat Not Completed: Other (comment)(Pt had just fallen asleep, OT check back as schedule allows)  Emelda Fear 09/08/2018, 2:09 PM   Sherryl Manges OTR/L Acute Rehabilitation Services Pager: [phone] Office: [phone]

## 2018-09-08 NOTE — Progress Notes (Signed)
Inpatient Diabetes Program Recommendations  AACE/ADA: New Consensus Statement on Inpatient Glycemic Control (2015)  Target Ranges:  Prepandial:   less than 140 mg/dL      Peak postprandial:   less than 180 mg/dL (1-2 hours)      Critically ill patients:  140 - 180 mg/dL   Lab Results  Component Value Date   GLUCAP 189 (H) 09/08/2018   HGBA1C 9.2 (H) 09/03/2018    Review of Glycemic Control  Diabetes history: DM2 Outpatient Diabetes medications: Amaryl 1 mg daily/ Glipizide 5 mg daily/ Metformin 500 mg BID/ Januvia 100mg  daily  Current orders for Inpatient glycemic control: Levemir 20 units BID; Novolog moderate scale (0-15 units) tid meals and (0-5 units hs); Nov 6 units meal coverage tid  Spoke with patient about current A1c of 9.2% (average of 217mg /dl). Explained what an A1c is and what it measures. Reminded patient that goal A1c is 7% or less per ADA standards to prevent both acute and long-term complications. Explained to patient the extreme importance of good glucose control at home. He states his aide checks it every morning M-F and records it for his MD to review. He said his brother is coming into town to help him for a while and I suggested perhaps his brother could check it on the weekends and one other time during the day and record.  He sees PCP in [place] who just retired but is now seeing another MD in that practice for his DM management. Stressed importance to continue to follow up with his MD and take his medicine to work towards lowering his Hgb A1c. Patient verbalizes understanding.  -- Will follow during hospitalization.--  Jamelle Rushing RN, MSN Diabetes Coordinator Inpatient Glycemic Control Team Team Pager: [phone] (8am-5pm)

## 2018-09-09 ENCOUNTER — Inpatient Hospital Stay (HOSPITAL_COMMUNITY): Payer: Medicare Other

## 2018-09-09 DIAGNOSIS — I4892 Unspecified atrial flutter: Secondary | ICD-10-CM

## 2018-09-09 HISTORY — DX: Unspecified atrial flutter: I48.92

## 2018-09-09 LAB — CBC
HCT: 27.3 % — ABNORMAL LOW (ref 39.0–52.0)
Hemoglobin: 9.3 g/dL — ABNORMAL LOW (ref 13.0–17.0)
MCH: 29.9 pg (ref 26.0–34.0)
MCHC: 34.1 g/dL (ref 30.0–36.0)
MCV: 87.8 fL (ref 80.0–100.0)
Platelets: 188 10*3/uL (ref 150–400)
RBC: 3.11 MIL/uL — ABNORMAL LOW (ref 4.22–5.81)
RDW: 14.3 % (ref 11.5–15.5)
WBC: 9.2 10*3/uL (ref 4.0–10.5)
nRBC: 0 % (ref 0.0–0.2)

## 2018-09-09 LAB — COMPREHENSIVE METABOLIC PANEL
ALT: 21 U/L (ref 0–44)
AST: 16 U/L (ref 15–41)
Albumin: 3.1 g/dL — ABNORMAL LOW (ref 3.5–5.0)
Alkaline Phosphatase: 46 U/L (ref 38–126)
Anion gap: 12 (ref 5–15)
BUN: 27 mg/dL — ABNORMAL HIGH (ref 8–23)
CO2: 26 mmol/L (ref 22–32)
Calcium: 8 mg/dL — ABNORMAL LOW (ref 8.9–10.3)
Chloride: 101 mmol/L (ref 98–111)
Creatinine, Ser: 1.21 mg/dL (ref 0.61–1.24)
GFR calc Af Amer: >60 mL/min (ref >60)
GFR calc non Af Amer: >60 mL/min (ref >60)
Glucose, Bld: 149 mg/dL — ABNORMAL HIGH (ref 70–99)
Potassium: 3.2 mmol/L — ABNORMAL LOW (ref 3.5–5.1)
Sodium: 139 mmol/L (ref 135–145)
Total Bilirubin: 0.8 mg/dL (ref 0.3–1.2)
Total Protein: 5.8 g/dL — ABNORMAL LOW (ref 6.5–8.1)

## 2018-09-09 LAB — MAGNESIUM: Magnesium: 2.2 mg/dL (ref 1.7–2.4)

## 2018-09-09 LAB — GLUCOSE, CAPILLARY
Glucose-Capillary: 132 mg/dL — ABNORMAL HIGH (ref 70–99)
Glucose-Capillary: 135 mg/dL — ABNORMAL HIGH (ref 70–99)
Glucose-Capillary: 193 mg/dL — ABNORMAL HIGH (ref 70–99)
Glucose-Capillary: 162 mg/dL — ABNORMAL HIGH (ref 70–99)
Glucose-Capillary: 183 mg/dL — ABNORMAL HIGH (ref 70–99)

## 2018-09-09 MED ORDER — MOVING RIGHT ALONG BOOK
Freq: Once | Status: AC
  Administered 2018-09-09: 18:00:00
  Filled 2018-09-09: qty 1

## 2018-09-09 MED ORDER — SODIUM CHLORIDE 0.9% FLUSH
3.0000 mL | Freq: Two times a day (BID) | INTRAVENOUS | Status: DC
  Administered 2018-09-09 – 2018-09-12 (×6): 3 mL via INTRAVENOUS

## 2018-09-09 MED ORDER — POTASSIUM CHLORIDE CRYS ER 20 MEQ PO TBCR
20.0000 meq | EXTENDED_RELEASE_TABLET | ORAL | Status: AC
  Administered 2018-09-09 (×3): 20 meq via ORAL
  Filled 2018-09-09 (×3): qty 1

## 2018-09-09 MED ORDER — CARVEDILOL 3.125 MG PO TABS
3.1250 mg | ORAL_TABLET | Freq: Two times a day (BID) | ORAL | Status: DC
  Administered 2018-09-09 – 2018-09-12 (×7): 3.125 mg via ORAL
  Filled 2018-09-09 (×7): qty 1

## 2018-09-09 MED ORDER — SODIUM CHLORIDE 0.9 % IV SOLN: 250.0000 mL | INTRAVENOUS | Status: DC | PRN

## 2018-09-09 MED ORDER — POTASSIUM CHLORIDE CRYS ER 20 MEQ PO TBCR
40.0000 meq | EXTENDED_RELEASE_TABLET | Freq: Two times a day (BID) | ORAL | Status: DC
  Administered 2018-09-10 – 2018-09-12 (×5): 40 meq via ORAL
  Filled 2018-09-09 (×5): qty 2

## 2018-09-09 MED ORDER — SODIUM CHLORIDE 0.9% FLUSH: 3.0000 mL | INTRAVENOUS | Status: DC | PRN

## 2018-09-09 MED ORDER — LEVALBUTEROL HCL 0.63 MG/3ML IN NEBU
0.6300 mg | INHALATION_SOLUTION | RESPIRATORY_TRACT | Status: DC | PRN
  Administered 2018-09-09: 0.63 mg via RESPIRATORY_TRACT

## 2018-09-09 MED FILL — Heparin Sodium (Porcine) Inj 1000 Unit/ML: INTRAMUSCULAR | Qty: 2500 | Status: AC

## 2018-09-09 MED FILL — Albumin, Human Inj 5%: INTRAVENOUS | Qty: 250 | Status: AC

## 2018-09-09 MED FILL — Heparin Sodium (Porcine) Inj 1000 Unit/ML: INTRAMUSCULAR | Qty: 30 | Status: AC

## 2018-09-09 MED FILL — Potassium Chloride Inj 2 mEq/ML: INTRAVENOUS | Qty: 40 | Status: AC

## 2018-09-09 MED FILL — Mannitol IV Soln 20%: INTRAVENOUS | Qty: 500 | Status: AC

## 2018-09-09 MED FILL — Lidocaine HCl Local Soln Prefilled Syringe 100 MG/5ML (2%): INTRAMUSCULAR | Qty: 10 | Status: AC

## 2018-09-09 MED FILL — Heparin Sodium (Porcine) Inj 1000 Unit/ML: INTRAMUSCULAR | Qty: 10 | Status: AC

## 2018-09-09 MED FILL — Magnesium Sulfate Inj 50%: INTRAMUSCULAR | Qty: 10 | Status: AC

## 2018-09-09 MED FILL — Sodium Chloride IV Soln 0.9%: INTRAVENOUS | Qty: 2000 | Status: AC

## 2018-09-09 MED FILL — Electrolyte-R (PH 7.4) Solution: INTRAVENOUS | Qty: 6000 | Status: AC

## 2018-09-09 NOTE — Progress Notes (Signed)
Report called at this time. No further questions.

## 2018-09-09 NOTE — Discharge Instructions (Signed)
Discharge Instructions:  1. You may shower, please wash incisions daily with soap and water and keep dry.  If you wish to cover wounds with dressing you may do so but please keep clean and change daily.  No tub baths or swimming until incisions have completely healed.  If your incisions become red or develop any drainage please call our office at [phone]  2. No Driving until cleared by Dr. Zenaida Niece Trigt's office and you are no longer using narcotic pain medications  3. Monitor your weight daily.. Please use the same scale and weigh at same time... If you gain 5-10 lbs in 48 hours with associated lower extremity swelling, please contact our office at [phone]  4. Fever of 101.5 for at least 24 hours with no source, please contact our office at [phone]  5. Activity- up as tolerated, please walk at least 3 times per day.  Avoid strenuous activity, no lifting, pushing, or pulling with your arms over 8-10 lbs for a minimum of 6 weeks  6. If any questions or concerns arise, please do not hesitate to contact our office at [phone]         Carbohydrate Counting for People with diaetes  Why Is Carbohydrate Counting Important?  Counting carbohydrate servings may help you control your blood glucose level so that you feel better.  The balance between the carbohydrates you eat and insulin determines what your blood glucose level will be after eating.  Carbohydrate counting can also help you plan your meals.  Which Foods Have Carbohydrates? Foods with carbohydrates include:  Breads, crackers, and cereals  Pasta, rice, and grains  Starchy vegetables, such as potatoes, corn, and peas  Beans and legumes  Milk, soy milk, and yogurt  Fruits and fruit juices  Sweets, such as cakes, cookies, ice cream, jam, and jelly  Carbohydrate Servings In diabetes meal planning, 1 serving of a food with carbohydrate has about 15 grams of carbohydrate:  Check serving sizes with  measuring cups and spoons or a food scale.  Read the Nutrition Facts on food labels to find out how many grams of carbohydrate are in foods you eat. The food lists in this handout show portions that have about 15 grams of carbohydrate.    Meal Planning Tips  An Eating Plan tells you how many carbohydrate servings to eat at your meals and snacks. For many adults, eating 3 to 5 servings of carbohydrate foods at each meal and 1 or 2 carbohydrate servings for each snack works well.  In a healthy daily Eating Plan, most carbohydrates come from: ? At least 6 servings of fruits and nonstarchy vegetables ? At least 6 servings of grains, beans, and starchy vegetables, with at least 3 servings from whole grains ? At least 2 servings of milk or milk products  Check your blood glucose level regularly. It can tell you if you need to adjust when you eat carbohydrates.  Eating foods that have fiber, such as whole grains, and having very few salty foods is good for your health.  Eat 4 to 6 ounces of meat or other protein foods (such as soybean burgers) each day. Choose low-fat sources of protein, such as lean beef, lean pork, chicken, fish, low-fat cheese, or vegetarian foods such as soy.  Eat some healthy fats, such as olive oil, canola oil, and nuts.  Eat very little saturated fats. These unhealthy fats are found in butter, cream, and high-fat meats, such as bacon and sausage.  Eat very little  or no trans fats. These unhealthy fats are found in all foods that list partially hydrogenated oil as an ingredient.  Label Reading Tips The Nutrition Facts panel on a label lists the grams of total carbohydrate in1 standard serving. The labels standard serving may be larger or smaller than 1 carbohydrate serving. To figure out how many carbohydrate servings are in the food:  First, look at the labels standard serving size.  Check the grams of total carbohydrate. This is the amount of carbohydrate  in 1 standard serving.  Divide the grams of total carbohydrate by 15. This number equals the number of carbohydrate servings in 1 standard serving. Remember: 1 carbohydrate serving is 15 grams of carbohydrate.  Note: You may ignore the grams of sugars on the Nutrition Facts panel because they are included in the grams of total carbohydrate.   Foods Recommended 1 serving = about 15 grams of carbohydrate  Starches  1 slice bread (1 ounce)  1 tortilla (6-inch size)   large bagel (1 ounce)  2 taco shells (5-inch size)   hamburger or hot dog bun ( ounce)   cup ready-to-eat unsweetened cereal   cup cooked cereal  1 cup broth-based soup  4 to 6 small crackers  1/3 cup pasta or rice (cooked)   cup beans, peas, corn, sweet potatoes, winter squash, or mashed or boiled potatoes (cooked)   large baked potato (3 ounces)   ounce pretzels, potato chips, or tortilla chips  3 cups popcorn (popped)  Fruit  1 small fresh fruit ( to 1 cup)   cup canned or frozen fruit  2 tablespoons dried fruit (blueberries, cherries, cranberries, mixed fruit, raisins)  17 small grapes (3 ounces)  1 cup melon or berries   cup unsweetened fruit juice  Milk  1 cup fat-free or reduced-fat milk  1 cup soy milk  2/3 cup (6 ounces) nonfat yogurt sweetened with sugar-free sweetener   Sweets and Desserts  2-inch square cake (unfrosted)  2 small cookies (2/3 ounce)   cup ice cream or frozen yogurt   cup sherbet or sorbet  1 tablespoon syrup, jam, jelly, table sugar, or honey  2 tablespoons light syrup  Other Foods  Count 1 cup raw vegetables or  cup cooked nonstarchy vegetables as zero (0) carbohydrate servings or free foods. If you eat 3 or more servings at one meal, count them as 1 carbohydrate serving.  Foods that have less than 20 calories in each serving also may be counted as zero carbohydrate servings or free foods.  Count 1 cup of casserole or other  mixed foods as 2 carbohydrate servings.     Carbohydrate Counting for People with Diabetes Sample 1-Day Menu Breakfast 1 extra-small banana (1 carbohydrate serving) 1 cup low-fat or fat-free milk (1 carbohydrate serving) 1 slice whole wheat bread (1 carbohydrate serving) 1 teaspoon margarine  Lunch 2 ounces [place] slices 2 slices whole wheat bread (2 carbohydrate servings) 2 lettuce leaves 4 celery sticks 4 carrot sticks 1 medium apple (1 carbohydrate serving) 1 cup low-fat or fat-free milk (1 carbohydrate serving)  Afternoon Snack 2 tablespoons raisins (1 carbohydrate serving) 3/4 ounce unsalted mini pretzels (1 carbohydrate serving)  Evening Meal 3 ounces lean roast beef  1/2 large baked potato (2 carbohydrate servings) 1 tablespoon reduced-fat sour cream 1/2 cup green beans 1 tablespoon light salad dressing 1 whole wheat dinner roll (1 carbohydrate serving) 1 teaspoon margarine 1 cup melon balls (1 carbohydrate serving)  Evening Snack 2 tablespoons unsalted nuts

## 2018-09-09 NOTE — Plan of Care (Signed)
Brief Nutrition Note  RD consulted for nutrition education regarding diabetes. RD working remotely. RD unable to reach pt via phone call to room despite multiple attempts.  Lab Results  Component Value Date   HGBA1C 9.2 (H) 09/03/2018    RD will attach  "Carbohydrate Counting for People with Diabetes" handout from the Academy of Nutrition and Dietetics to pt's discharge instructions. Handout discusses different food groups and their effects on blood sugar, emphasizing carbohydrate-containing foods. Handout provides a list of carbohydrates and recommended serving sizes of common foods.  Handout discusses importance of controlled and consistent carbohydrate intake throughout the day.  Body mass index is 32.38 kg/m. Pt meets criteria for obesity class I based on current BMI.  Current diet order is Heart Healthy/Carb Modified, patient is consuming approximately 75% of meals at this time. Labs and medications reviewed. No further nutrition interventions warranted at this time. If additional nutrition issues arise, please re-consult RD.   Earma Reading, MS, RD, LDN Inpatient Clinical Dietitian Pager: [phone] Weekend/After Hours: [phone]

## 2018-09-09 NOTE — Progress Notes (Addendum)
[facility] DAILY ICU PROGRESS NOTE                   [address]            [place] [address]          [phone]   4 Days Post-Op Procedure(s) (LRB): CORONARY ARTERY BYPASS GRAFTING (CABG) x4, ON PUMP, USING LEFT INTERNAL MAMMARY ARTERY AND RIGHT AND LEFT GREAT SAPHENOUS VEIN HARVESTED ENDOSCOPICALLY (N/A) TRANSESOPHAGEAL ECHOCARDIOGRAM (TEE) (N/A)  Total Length of Stay:  LOS: 4 days   Subjective: Up in the chair having breakfast.  He said he had a bowel movement yesterday.  He has walked with assistance. Good diuresis again yesterday.  Wt = pre-op wt.  Objective: Vital signs in last 24 hours: Temp:  [97.7 F (36.5 C)-98.7 F (37.1 C)] 97.7 F (36.5 C) (05/19 0645) Pulse Rate:  [62-88] 62 (05/19 0700) Cardiac Rhythm: Normal sinus rhythm (05/19 0400) Resp:  [19-32] 21 (05/19 0700) BP: (102-140)/(68-90) 109/73 (05/19 0700) SpO2:  [93 %-100 %] 95 % (05/19 0700) Weight:  [96.6 kg] 96.6 kg (05/19 0500)  Filed Weights   09/07/18 0600 09/08/18 0436 09/09/18 0500  Weight: 101 kg 98.7 kg 96.6 kg    Weight change: -2.1 kg   Hemodynamic parameters for last 24 hours:    Intake/Output from previous day: 05/18 0701 - 05/19 0700 In: 573.1 [P.O.:480; I.V.:2.1; IV Piggyback:91] Out: 2460 [Urine:2460]  Intake/Output this shift: No intake/output data recorded.  Current Meds: Scheduled Meds: . acetaminophen  1,000 mg Oral Q6H   Or  . acetaminophen (TYLENOL) oral liquid 160 mg/5 mL  1,000 mg Per Tube Q6H  . aspirin EC  81 mg Oral Daily  . bisacodyl  10 mg Oral Daily   Or  . bisacodyl  10 mg Rectal Daily  . chlorhexidine gluconate (MEDLINE KIT)  15 mL Mouth Rinse BID  . Chlorhexidine Gluconate Cloth  6 each Topical Daily  . clopidogrel  75 mg Oral Daily  . docusate sodium  200 mg Oral Daily  . enoxaparin (LOVENOX) injection  40 mg Subcutaneous QHS  . furosemide  40 mg Intravenous Daily  . insulin aspart  0-15 Units Subcutaneous TID WC  . insulin aspart  0-5  Units Subcutaneous QHS  . insulin aspart  6 Units Subcutaneous TID WC  . insulin detemir  20 Units Subcutaneous BID  . levalbuterol  0.63 mg Nebulization TID  . mouth rinse  15 mL Mouth Rinse BID  . metoprolol tartrate  12.5 mg Oral BID   Or  . metoprolol tartrate  12.5 mg Per Tube BID  . pantoprazole  40 mg Oral Daily  . potassium chloride  20 mEq Oral Q4H  . simethicone  80 mg Oral QID  . sodium chloride flush  10-40 mL Intracatheter Q12H  . sodium chloride flush  3 mL Intravenous Q12H  . sorbitol  60 mL Oral Once   Continuous Infusions: . sodium chloride Stopped (09/06/18 0636)  . sodium chloride    . sodium chloride 10 mL/hr at 09/08/18 0604   PRN Meds:.sodium chloride, acetaminophen, levalbuterol, metoprolol tartrate, ondansetron (ZOFRAN) IV, oxyCODONE, sodium chloride flush, sodium chloride flush, traMADol  General appearance: alert, cooperative and no distress Neurologic: No gros deficits Heart: SR with occasional PAC's and PVC's Lungs: Breath sounds are clear to auscultation Extremities:  No LE edema today. Wound: The sternotomy incision is open to air.  The skin edges well approximated.  No drainage or erythema.  [place]  incisions intact and dry.  Lab Results: CBC: Recent Labs    09/08/18 0430 09/09/18 0327  WBC 11.0* 9.2  HGB 9.1* 9.3*  HCT 27.5* 27.3*  PLT 168 188   BMET:  Recent Labs    09/08/18 0430 09/08/18 1000 09/09/18 0327  NA 138  --  139  K 2.8* 3.0* 3.2*  CL 103  --  101  CO2 26  --  26  GLUCOSE 141*  --  149*  BUN 27*  --  27*  CREATININE 1.22  --  1.21  CALCIUM 8.0*  --  8.0*    CMET: Lab Results  Component Value Date   WBC 9.2 09/09/2018   HGB 9.3 (L) 09/09/2018   HCT 27.3 (L) 09/09/2018   PLT 188 09/09/2018   GLUCOSE 149 (H) 09/09/2018   CHOL 123 06/20/2018   TRIG 124 06/20/2018   HDL 42 06/20/2018   LDLCALC 60 06/20/2018   ALT 21 09/09/2018   AST 16 09/09/2018   NA 139 09/09/2018   K 3.2 (L) 09/09/2018   CL 101 09/09/2018    CREATININE 1.21 09/09/2018   BUN 27 (H) 09/09/2018   CO2 26 09/09/2018   INR 1.4 (H) 09/05/2018   HGBA1C 9.2 (H) 09/03/2018      PT/INR: No results for input(s): LABPROT, INR in the last 72 hours. Radiology: Dg Chest Port 1 View  Result Date: 09/09/2018 CLINICAL DATA:  68 year old male with prior CABG and chest pain EXAM: PORTABLE CHEST 1 VIEW COMPARISON:  09/08/2018, 09/07/2010 FINDINGS: Cardiomediastinal silhouette unchanged with surgical changes of median sternotomy and CABG. Interval removal of right IJ sheath. Epicardial pacing leads remain. Persistent retrocardiac opacity. No interlobular septal thickening. Linear opacities bilaterally. No pneumothorax. IMPRESSION: Low lung volumes with likely atelectasis. Interval removal of right IJ sheath. Epicardial pacing leads remain. Surgical changes of median sternotomy and CABG Electronically Signed   By: Corrie Mckusick D.O.   On: 09/09/2018 08:07     Assessment/Plan: S/P Procedure(s) (LRB): CORONARY ARTERY BYPASS GRAFTING (CABG) x4, ON PUMP, USING LEFT INTERNAL MAMMARY ARTERY AND RIGHT AND LEFT GREAT SAPHENOUS VEIN HARVESTED ENDOSCOPICALLY (N/A) TRANSESOPHAGEAL ECHOCARDIOGRAM (TEE) (N/A)  -Postop day 4 CABG x4 for multivessel coronary artery disease and preoperative cardiomyopathy.  Stable hemodynamically with good perfusion.   Continue aspirin, statin, beta-blocker, and Plavix.  -Expected postoperative respiratory insufficiency with volume overload.  He has diuresed well over the past 2 days and weight is now equivalent preop.  Renal function remained stable.  We will continue oral Lasix daily.  Continue to encourage pulmonary hygiene.  Add Xopenex.  Wean oxygen per protocol, currently on 4 L nasal cannula.  -Hypokalemia-improved.  Continue p.o. supplements today and monitor  -Atrial flutter, early postop.-Initially managed with amiodarone drip that was converted to oral amiodarone.  Off amiodarone past 24 hours.  He is now maintaining  sinus rhythm with occasional PACs and PVCs.  -Expected acute blood loss anemia-hematocrit is stable.  Continue to monitor  -Type 2 diabetes mellitus--glucose control is adequate on sliding scale insulin coverage.  Continue to monitor  -DVT prophylaxis-continue enoxaparin subcu daily  -Plan transfer to 4 E when off hi-flow O2 and maintaining adequate O2 sats.   Antony Odea, PA-C [phone] 09/09/2018 8:11 AM   Generally improving, heart rate now 60-70 so we will roll and tape temporary pacing wires and start low-dose carvedilol.  Hold amiodarone because of significant bradycardia resulting from previous dose Oxygen requirements are improving with diuresis and ambulation Ready to transfer to  stepdown  patient examined and medical record reviewed,agree with above note. Tharon Aquas Trigt III 09/09/2018

## 2018-09-09 NOTE — Evaluation (Signed)
Occupational Therapy Evaluation Patient Details Name: Jerry Myers MRN: [id number] DOB: 07-18-1950 Today's Date: 09/09/2018    History of Present Illness 68 year old diabetic with hypertension and family history of CABG presents after recent cardiac catheterization by DrEndshowing three-vessel CAD in a diabetic pattern with moderate LV dysfunction by echo with EF 50% and moderate MR. Patient symptoms are dyspnea with exertion; now s/p CABG x4;  has a past medical history of Anxiety, Arthritis, Coronary artery disease, Coronary artery disease of native artery of native heart with stable angina pectoris (HCC) (03/18/2015), Depression, Dyspnea   Clinical Impression   This 68 yo male admitted and underwent above presents to acute OT with decreased bed mobility, decreased safety when up on feet, decreased independence with basic ADLs due to now with new sternal precautions, and increased pain all affecting his PLOF of being independent with basic ADLs and driving but had A for IADLS and medication management. He will benefit from acute OT with follow up OT on CIR to get to get to the point that [facility]. We will continue to follow acutely.    Follow Up Recommendations  CIR;Supervision - Intermittent    Equipment Recommendations  3 in 1 bedside commode;Tub/shower seat       Precautions / Restrictions Precautions Precautions: Sternal;Fall Precaution Comments: re-educated on the sternal precautions, pt with verbal understanding but requires verbal cues during function Restrictions Weight Bearing Restrictions: Yes(sternal precautions)      Mobility Bed Mobility Overal bed mobility: Needs Assistance Bed Mobility: Rolling;Sidelying to Sit;Sit to Sidelying Rolling: Min assist   Supine to sit: Mod assist(partially for legs and partially for trunk)   Sit to sidelying: Mod assist(for legs)    Transfers Overall transfer level: Needs assistance Equipment used:  Rolling walker (2 wheeled) Transfers: Sit to/from Stand Sit to Stand: Min assist         General transfer comment: pt holding onto heart pillow and coming forward enough to use legs to power up    Balance Overall balance assessment: Needs assistance Sitting-balance support: No upper extremity supported;Feet supported Sitting balance-Leahy Scale: Good     Standing balance support: No upper extremity supported;During functional activity Standing balance-Leahy Scale: Fair Standing balance comment: standing to pull up and fasten pants                           ADL either performed or assessed with clinical judgement   ADL Overall ADL's : Needs assistance/impaired Eating/Feeding: Independent;Sitting   Grooming: Set up;Supervision/safety;Sitting   Upper Body Bathing: Supervision/ safety;Set up;Sitting   Lower Body Bathing: Moderate assistance Lower Body Bathing Details (indicate cue type and reason): min A sit<>stand; no AE Upper Body Dressing : Minimal assistance;Sitting Upper Body Dressing Details (indicate cue type and reason): arms in first, then overhead to don, pulling shirt behind neck to doff Lower Body Dressing: Maximal assistance Lower Body Dressing Details (indicate cue type and reason): min A with sit<>stand; No AE Toilet Transfer: Minimal assistance;Ambulation;RW   Toileting- Clothing Manipulation and Hygiene: Moderate assistance Toileting - Clothing Manipulation Details (indicate cue type and reason): min A sit<>stand             Vision Baseline Vision/History: Wears glasses Wears Glasses: At all times Patient Visual Report: No change from baseline              Pertinent Vitals/Pain Pain Assessment: 0-10 Pain Score: 4  Pain Location: chest at op site  when he coughs Pain Descriptors / Indicators: Sore;Tender;Operative site guarding Pain Intervention(s): Limited activity within patient's tolerance;Monitored during session     Hand  Dominance Right   Extremity/Trunk Assessment Upper Extremity Assessment Upper Extremity Assessment: Generalized weakness              Cognition Arousal/Alertness: Awake/alert Behavior During Therapy: WFL for tasks assessed/performed Overall Cognitive Status: Within Functional Limits for tasks assessed                                 General Comments: Pleasantly simple man who is unsure if he can have 24 hour A at home. Needs VCs for following sternal precautions              Home Living Family/patient expects to be discharged to:: Inpatient rehab Living Arrangements: Alone Available Help at Discharge: [facility] 3 hours a day (not sure 5 or 7 days a week); HHRN for med management) Type of Home: House       Home Layout: One level     Bathroom Shower/Tub: Chief Strategy Officer: Standard     Home Equipment: Bedside commode          Prior Functioning/Environment Level of Independence: Independent        Comments: still driving, grocery shopping prior to admission        OT Problem List: Decreased strength;Decreased range of motion;Impaired balance (sitting and/or standing);Pain;Decreased knowledge of use of DME or AE;Decreased knowledge of precautions      OT Treatment/Interventions: Balance training;Self-care/ADL training;DME and/or AE instruction;Patient/family education    OT Goals(Current goals can be found in the care plan section) Acute Rehab OT Goals Patient Stated Goal: to go home OT Goal Formulation: With patient Time For Goal Achievement: 09/23/18 Potential to Achieve Goals: Good  OT Frequency: Min 2X/week   Barriers to D/C: Decreased caregiver support             AM-PAC OT "6 Clicks" Daily Activity     Outcome Measure Help from another person eating meals?: None Help from another person taking care of personal grooming?: A Little Help from another person toileting, which includes using toliet, bedpan, or urinal?: A  Lot Help from another person bathing (including washing, rinsing, drying)?: A Lot Help from another person to put on and taking off regular upper body clothing?: A Lot Help from another person to put on and taking off regular lower body clothing?: A Lot 6 Click Score: 15   End of Session Equipment Utilized During Treatment: (heart pillow) Nurse Communication: (CM--pt is not safe to go home by himself based on current level of ADL function and mobility)  Activity Tolerance: Patient tolerated treatment well Patient left: in bed;with call bell/phone within reach;with bed alarm set  OT Visit Diagnosis: Unsteadiness on feet (R26.81);Other abnormalities of gait and mobility (R26.89);Muscle weakness (generalized) (M62.81);Pain Pain - part of body: (incisional)                Time: 6967-8938 OT Time Calculation (min): 43 min Charges:  OT General Charges $OT Visit: 1 Visit OT Evaluation $OT Eval Moderate Complexity: 1 Mod OT Treatments $Self Care/Home Management : 23-37 mins  .Ignacia Palma, OTR/L Acute Rehab Services Pager [phone] Office [phone]    Evette Georges 09/09/2018, 3:03 PM

## 2018-09-09 NOTE — Progress Notes (Addendum)
Rehab Admissions Coordinator Note:  Patient was screened by Stephania Fragmin for appropriateness for an Inpatient Acute Rehab Consult.  At this time, we are recommending Inpatient Rehab consult. Please place IP Rehab MD Consult order.    Stephania Fragmin 09/09/2018, 4:44 PM  I can be reached at [phone].

## 2018-09-10 ENCOUNTER — Inpatient Hospital Stay (HOSPITAL_COMMUNITY): Payer: Medicare Other

## 2018-09-10 LAB — BASIC METABOLIC PANEL
Anion gap: 10 (ref 5–15)
BUN: 34 mg/dL — ABNORMAL HIGH (ref 8–23)
CO2: 26 mmol/L (ref 22–32)
Calcium: 8.1 mg/dL — ABNORMAL LOW (ref 8.9–10.3)
Chloride: 101 mmol/L (ref 98–111)
Creatinine, Ser: 1.23 mg/dL (ref 0.61–1.24)
GFR calc Af Amer: >60 mL/min (ref >60)
GFR calc non Af Amer: 59 mL/min — ABNORMAL LOW (ref >60)
Glucose, Bld: 213 mg/dL — ABNORMAL HIGH (ref 70–99)
Potassium: 3.3 mmol/L — ABNORMAL LOW (ref 3.5–5.1)
Sodium: 137 mmol/L (ref 135–145)

## 2018-09-10 LAB — GLUCOSE, CAPILLARY
Glucose-Capillary: 127 mg/dL — ABNORMAL HIGH (ref 70–99)
Glucose-Capillary: 144 mg/dL — ABNORMAL HIGH (ref 70–99)
Glucose-Capillary: 283 mg/dL — ABNORMAL HIGH (ref 70–99)
Glucose-Capillary: 183 mg/dL — ABNORMAL HIGH (ref 70–99)

## 2018-09-10 LAB — CBC
HCT: 25.9 % — ABNORMAL LOW (ref 39.0–52.0)
Hemoglobin: 8.7 g/dL — ABNORMAL LOW (ref 13.0–17.0)
MCH: 30.1 pg (ref 26.0–34.0)
MCHC: 33.6 g/dL (ref 30.0–36.0)
MCV: 89.6 fL (ref 80.0–100.0)
Platelets: 215 10*3/uL (ref 150–400)
RBC: 2.89 MIL/uL — ABNORMAL LOW (ref 4.22–5.81)
RDW: 14.4 % (ref 11.5–15.5)
WBC: 7.9 10*3/uL (ref 4.0–10.5)
nRBC: 0 % (ref 0.0–0.2)

## 2018-09-10 MED ORDER — HYDROCHLOROTHIAZIDE 25 MG PO TABS
25.0000 mg | ORAL_TABLET | Freq: Every day | ORAL | Status: DC
  Administered 2018-09-10 – 2018-09-12 (×3): 25 mg via ORAL
  Filled 2018-09-10 (×3): qty 1

## 2018-09-10 MED ORDER — GLIMEPIRIDE 1 MG PO TABS
1.0000 mg | ORAL_TABLET | Freq: Every day | ORAL | Status: DC
  Administered 2018-09-10 – 2018-09-11 (×2): 1 mg via ORAL
  Filled 2018-09-10 (×3): qty 1

## 2018-09-10 MED ORDER — GLIPIZIDE ER 5 MG PO TB24
5.0000 mg | ORAL_TABLET | Freq: Every day | ORAL | Status: DC
  Filled 2018-09-10: qty 1

## 2018-09-10 MED ORDER — GLIMEPIRIDE 1 MG PO TABS: 1.0000 mg | ORAL_TABLET | Freq: Every day | ORAL | Status: DC

## 2018-09-10 NOTE — Progress Notes (Signed)
Occupational Therapy Treatment Patient Details Name: Jerry Myers MRN: [id number] DOB: 1951/01/03 Today's Date: 09/10/2018    History of present illness 68 year old diabetic with hypertension and family history of CABG presents after recent cardiac catheterization by DrEndshowing three-vessel CAD in a diabetic pattern with moderate LV dysfunction by echo with EF 50% and moderate MR. Patient symptoms are dyspnea with exertion; now s/p CABG x4;  has a past medical history of Anxiety, Arthritis, Coronary artery disease, Coronary artery disease of native artery of native heart with stable angina pectoris (HCC) (03/18/2015), Depression, Dyspnea   OT comments  Pt performing ADL functional transfers and ADL functional mobility with minguardA to minA for power up. Pt using RW for mobility in room. Pt performing own toilet hygiene with supervisionA. Pt performing ADL standing at sink for grooming tasks with supervisionA. Pt performing Sit to stands minA for power up and pt requiring assist with sternal precautions. LB AE shown and minA required overall. Pt with good carry overskills, but requires increased time. Pt would benefit from continued OT skilled services for ADL, mobility and safety in CIR versus another post acute rehab placement based on progress. OT to follow acutely.     Follow Up Recommendations  CIR;Home health OT;Supervision - Intermittent(CIR vs HH pending progress)    Equipment Recommendations  3 in 1 bedside commode;Tub/shower seat    Recommendations for Other Services      Precautions / Restrictions Precautions Precautions: Sternal;Fall Precaution Booklet Issued: Yes (comment) Precaution Comments: re-educated on the sternal precautions, pt with verbal understanding but requires verbal cues during function Restrictions Weight Bearing Restrictions: Yes Other Position/Activity Restrictions: sternal precautions       Mobility Bed Mobility Overal bed mobility: Needs  Assistance             General bed mobility comments: Pt up in recliner already  Transfers Overall transfer level: Needs assistance Equipment used: Rolling walker (2 wheeled) Transfers: Sit to/from Stand Sit to Stand: Min assist         General transfer comment: pt holding onto heart pillow and coming forward enough to use legs to power up    Balance Overall balance assessment: Needs assistance Sitting-balance support: No upper extremity supported;Feet supported Sitting balance-Leahy Scale: Good     Standing balance support: No upper extremity supported;During functional activity Standing balance-Leahy Scale: Fair Standing balance comment: standing to pull up and fasten pants                           ADL either performed or assessed with clinical judgement   ADL Overall ADL's : Needs assistance/impaired     Grooming: Set up;Supervision/safety;Standing               Lower Body Dressing: Minimal assistance;With adaptive equipment;Cueing for safety;Cueing for sequencing;Sitting/lateral leans Lower Body Dressing Details (indicate cue type and reason): pt shown LB AE for donning/doffing socks.               General ADL Comments: Pt shown AE for donning/doffing socks with cues for proper technique with stermal precautions.     Vision Baseline Vision/History: Wears glasses Wears Glasses: At all times Patient Visual Report: No change from baseline Vision Assessment?: No apparent visual deficits   Perception     Praxis      Cognition Arousal/Alertness: Awake/alert Behavior During Therapy: WFL for tasks assessed/performed Overall Cognitive Status: Within Functional Limits for tasks assessed  General Comments: Pt reports that his brother can assist at home.        Exercises     Shoulder Instructions       General Comments Pt's O2 2L >90%. HR had increased to 140, but immediately under 105  BPM.    Pertinent Vitals/ Pain       Pain Assessment: Faces Faces Pain Scale: Hurts a little bit Pain Location: chest at op site when he coughs Pain Descriptors / Indicators: Sore;Tender;Operative site guarding Pain Intervention(s): Monitored during session  Home Living Family/patient expects to be discharged to:: Inpatient rehab Living Arrangements: Alone Available Help at Discharge: Other (Comment) Type of Home: House Home Access: Other (comment)     Home Layout: One level     Bathroom Shower/Tub: Chief Strategy Officer: Standard     Home Equipment: Bedside commode   Additional Comments: Per chart review, there is an option for him to go home with his sister Jerry Myers; He did not mention this on PT eval; Rn documented option to go to sister's on 5/15      Prior Functioning/Environment Level of Independence: Independent            Frequency  Min 2X/week        Progress Toward Goals  OT Goals(current goals can now be found in the care plan section)     Acute Rehab OT Goals Patient Stated Goal: to go home OT Goal Formulation: With patient Time For Goal Achievement: 09/23/18 Potential to Achieve Goals: Good  Plan      Co-evaluation                 AM-PAC OT "6 Clicks" Daily Activity     Outcome Measure   Help from another person eating meals?: None Help from another person taking care of personal grooming?: A Little Help from another person toileting, which includes using toliet, bedpan, or urinal?: A Little Help from another person bathing (including washing, rinsing, drying)?: A Lot Help from another person to put on and taking off regular upper body clothing?: A Little Help from another person to put on and taking off regular lower body clothing?: A Lot 6 Click Score: 17    End of Session Equipment Utilized During Treatment: Rolling walker;Gait belt  OT Visit Diagnosis: Unsteadiness on feet (R26.81);Other abnormalities of gait and  mobility (R26.89);Muscle weakness (generalized) (M62.81);Pain   Activity Tolerance Patient tolerated treatment well   Patient Left in chair;with call bell/phone within reach   Nurse Communication Mobility status        Time: 0349-1791 OT Time Calculation (min): 33 min  Charges: OT General Charges $OT Visit: 1 Visit OT Treatments $Self Care/Home Management : 8-22 mins $Therapeutic Activity: 8-22 mins  Jerry Myers) Glendell Docker OTR/L Acute Rehabilitation Services Pager: [phone] Office: [phone]    Jerry Myers 09/10/2018, 3:28 PM

## 2018-09-10 NOTE — Care Management Important Message (Signed)
Important Message  Patient Details  Name: Jerry Myers MRN: [id number] Date of Birth: 27-Mar-1951   Medicare Important Message Given:  Yes    Dorena Bodo 09/10/2018, 12:25 PM

## 2018-09-10 NOTE — Progress Notes (Addendum)
Cardiac Rehab Advisory Cardiac Rehab Phase I is not seeing pts face to face at this time due to Covid 19 restrictions. Ambulation is occurring through nursing, PT, and mobility teams. We will help facilitate that process as needed. We are calling pts in their rooms and discussing education. We will then deliver education materials to pts RN for delivery to pt.   Spoke with pt by phone. Discussed walking in hall (wants to walk now, notified RN), IS, restrictions at home, diet, and CRPII. Pt voiced understanding but admits that he does not read. He normally has an adie who cooks and cleans. I will deliver education materials to give to family and aide. Will refer pt to [place] CRPII for when the program does open however I cannot put in order for Dr. Tomie Myers. Pt does drive normally. Pt will need a RW for home. He has a cane.  [phone] Jerry Myers CES, ACSM 1:53 PM 09/10/2018

## 2018-09-10 NOTE — Progress Notes (Signed)
PT Cancellation Note  Patient Details Name: ABDURRAHEEM AFIFI MRN: [id number] DOB: [birth year]   Cancelled Treatment:     Patient reports he just got back from his walk. Per nursing he walked over 400'. Patient declines further treatment for today but is motivated to participate tomorrow.    Dessie Coma PT DPT  09/10/2018, 3:17 PM

## 2018-09-10 NOTE — Progress Notes (Addendum)
[address]       [place] [id number]             [phone]      5 Days Post-Op Procedure(s) (LRB): CORONARY ARTERY BYPASS GRAFTING (CABG) x4, ON PUMP, USING LEFT INTERNAL MAMMARY ARTERY AND RIGHT AND LEFT GREAT SAPHENOUS VEIN HARVESTED ENDOSCOPICALLY (N/A) TRANSESOPHAGEAL ECHOCARDIOGRAM (TEE) (N/A) Subjective: conts to feel better/stronger  Objective: Vital signs in last 24 hours: Temp:  [97.7 F (36.5 C)-98.4 F (36.9 C)] 98.1 F (36.7 C) (05/20 0743) Pulse Rate:  [59-81] 63 (05/20 0748) Cardiac Rhythm: Normal sinus rhythm (05/20 0743) Resp:  [11-27] 18 (05/20 0743) BP: (107-150)/(71-106) 122/80 (05/20 0748) SpO2:  [91 %-99 %] 97 % (05/20 0807) Weight:  [93.8 kg] 93.8 kg (05/20 0500)  Hemodynamic parameters for last 24 hours:    Intake/Output from previous day: 05/19 0701 - 05/20 0700 In: 720 [P.O.:720] Out: 400 [Urine:400] Intake/Output this shift: No intake/output data recorded.  General appearance: alert, cooperative and no distress Heart: regular rate and rhythm Lungs: clear to auscultation bilaterally Abdomen: mild distension, non-tender, + BS Extremities: minor edema Wound: incis healing well  Lab Results: Recent Labs    09/09/18 0327 09/10/18 0250  WBC 9.2 7.9  HGB 9.3* 8.7*  HCT 27.3* 25.9*  PLT 188 215   BMET:  Recent Labs    09/09/18 0327 09/10/18 0250  NA 139 137  K 3.2* 3.3*  CL 101 101  CO2 26 26  GLUCOSE 149* 213*  BUN 27* 34*  CREATININE 1.21 1.23  CALCIUM 8.0* 8.1*    PT/INR: No results for input(s): LABPROT, INR in the last 72 hours. ABG    Component Value Date/Time   PHART 7.435 09/07/2018 0440   HCO3 22.9 09/07/2018 0440   TCO2 24 09/07/2018 0440   ACIDBASEDEF 1.0 09/07/2018 0440   O2SAT 56.4 09/08/2018 1010   CBG (last 3)  Recent Labs    09/09/18 1121 09/09/18 1622 09/09/18 2131  GLUCAP 193* 162* 183*    Meds Scheduled Meds: . acetaminophen  1,000 mg Oral Q6H   Or  . acetaminophen  (TYLENOL) oral liquid 160 mg/5 mL  1,000 mg Per Tube Q6H  . aspirin EC  81 mg Oral Daily  . bisacodyl  10 mg Oral Daily   Or  . bisacodyl  10 mg Rectal Daily  . carvedilol  3.125 mg Oral BID WC  . clopidogrel  75 mg Oral Daily  . enoxaparin (LOVENOX) injection  40 mg Subcutaneous QHS  . furosemide  40 mg Intravenous Daily  . insulin aspart  0-15 Units Subcutaneous TID WC  . insulin aspart  0-5 Units Subcutaneous QHS  . insulin aspart  6 Units Subcutaneous TID WC  . insulin detemir  20 Units Subcutaneous BID  . levalbuterol  0.63 mg Nebulization TID  . pantoprazole  40 mg Oral Daily  . potassium chloride  40 mEq Oral BID  . simethicone  80 mg Oral QID  . sodium chloride flush  3 mL Intravenous Q12H   Continuous Infusions: . sodium chloride     PRN Meds:.sodium chloride, levalbuterol, ondansetron (ZOFRAN) IV, oxyCODONE, sodium chloride flush, traMADol  Xrays Dg Chest Port 1 View  Result Date: 09/09/2018 CLINICAL DATA:  68 year old male with prior CABG and chest pain EXAM: PORTABLE CHEST 1 VIEW COMPARISON:  09/08/2018, 09/07/2010 FINDINGS: Cardiomediastinal silhouette unchanged with surgical changes of median sternotomy and CABG. Interval removal of right IJ sheath. Epicardial pacing leads remain. Persistent  retrocardiac opacity. No interlobular septal thickening. Linear opacities bilaterally. No pneumothorax. IMPRESSION: Low lung volumes with likely atelectasis. Interval removal of right IJ sheath. Epicardial pacing leads remain. Surgical changes of median sternotomy and CABG Electronically Signed   By: Gilmer Mor D.O.   On: 09/09/2018 08:07    Assessment/Plan: S/P Procedure(s) (LRB): CORONARY ARTERY BYPASS GRAFTING (CABG) x4, ON PUMP, USING LEFT INTERNAL MAMMARY ARTERY AND RIGHT AND LEFT GREAT SAPHENOUS VEIN HARVESTED ENDOSCOPICALLY (N/A) TRANSESOPHAGEAL ECHOCARDIOGRAM (TEE) (N/A)   1 doing well overall 2 hemodyn stable,SBP is variable + PVC's, multifocal, chronic- will replace  K+ for 3.3. Magnesium level is normal. Not sure HR would tol higher beta blocker dose currently 3 cont pulm RX and O2 wean 4 ABL anemia is fairly stable- cont to monitor, not at transfusion threshold 5 Weight below preop- will d/c lasix and resume preop HCTZ , hold on ACE-I/ARB for now with well controlled/slightly low BP  6 d/c insulin and order home orals 7 routine rehab and pulm toilet  LOS: 5 days    Rowe Clack PA-C 09/10/2018 Pager [phone]  Weaning oxygen Patient ambulating more Chest x-ray with improved lung volumes, no significant pleural effusion Surgical incisions appear to be healing Plan probable discharge home Friday patient examined and medical record reviewed,agree with above note. Kathlee Nations Trigt III 09/10/2018

## 2018-09-11 LAB — CBC
HCT: 29.2 % — ABNORMAL LOW (ref 39.0–52.0)
Hemoglobin: 9.8 g/dL — ABNORMAL LOW (ref 13.0–17.0)
MCH: 30.1 pg (ref 26.0–34.0)
MCHC: 33.6 g/dL (ref 30.0–36.0)
MCV: 89.6 fL (ref 80.0–100.0)
Platelets: 260 10*3/uL (ref 150–400)
RBC: 3.26 MIL/uL — ABNORMAL LOW (ref 4.22–5.81)
RDW: 14.3 % (ref 11.5–15.5)
WBC: 7.5 10*3/uL (ref 4.0–10.5)
nRBC: 0.7 % — ABNORMAL HIGH (ref 0.0–0.2)

## 2018-09-11 LAB — GLUCOSE, CAPILLARY
Glucose-Capillary: 141 mg/dL — ABNORMAL HIGH (ref 70–99)
Glucose-Capillary: 148 mg/dL — ABNORMAL HIGH (ref 70–99)
Glucose-Capillary: 204 mg/dL — ABNORMAL HIGH (ref 70–99)
Glucose-Capillary: 159 mg/dL — ABNORMAL HIGH (ref 70–99)
Glucose-Capillary: 181 mg/dL — ABNORMAL HIGH (ref 70–99)

## 2018-09-11 LAB — BASIC METABOLIC PANEL
Anion gap: 9 (ref 5–15)
BUN: 31 mg/dL — ABNORMAL HIGH (ref 8–23)
CO2: 29 mmol/L (ref 22–32)
Calcium: 8.4 mg/dL — ABNORMAL LOW (ref 8.9–10.3)
Chloride: 100 mmol/L (ref 98–111)
Creatinine, Ser: 1.19 mg/dL (ref 0.61–1.24)
GFR calc Af Amer: >60 mL/min (ref >60)
GFR calc non Af Amer: >60 mL/min (ref >60)
Glucose, Bld: 178 mg/dL — ABNORMAL HIGH (ref 70–99)
Potassium: 3.2 mmol/L — ABNORMAL LOW (ref 3.5–5.1)
Sodium: 138 mmol/L (ref 135–145)

## 2018-09-11 MED ORDER — METFORMIN HCL 500 MG PO TABS
500.0000 mg | ORAL_TABLET | Freq: Two times a day (BID) | ORAL | Status: DC
  Administered 2018-09-11 – 2018-09-12 (×3): 500 mg via ORAL
  Filled 2018-09-11 (×3): qty 1

## 2018-09-11 MED ORDER — CLONAZEPAM 0.5 MG PO TABS
0.5000 mg | ORAL_TABLET | Freq: Every day | ORAL | Status: DC
  Administered 2018-09-11: 0.5 mg via ORAL
  Filled 2018-09-11: qty 1

## 2018-09-11 MED ORDER — LINAGLIPTIN 5 MG PO TABS
5.0000 mg | ORAL_TABLET | Freq: Every day | ORAL | Status: DC
  Administered 2018-09-11 – 2018-09-12 (×2): 5 mg via ORAL
  Filled 2018-09-11 (×2): qty 1

## 2018-09-11 NOTE — Progress Notes (Signed)
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 Patient symptoms are dyspnea with exertion; now s/p CABG x4;  has a past medical history of Anxiety, Arthritis, Coronary artery disease, Coronary artery disease of native artery of native heart with stable angina pectoris (HCC) (03/18/2015), Depression, Dyspnea    PT Comments    Pt performed progression of gait training. He is now requiring min guard.  He requires re-education on sternal precautions for safety and compliance.  Pt continues to require 24 hr assistance to return home with HHPT.  Plan next session for progression to stair training.    Follow Up Recommendations  Home health PT;Supervision/Assistance - 24 hour;Other (comment)     Equipment Recommendations  Rolling walker with 5" wheels;3in1 (PT)    Recommendations for Other Services OT consult     Precautions / Restrictions Precautions Precautions: Sternal;Fall Precaution Booklet Issued: Yes (comment) Precaution Comments: re-educated on the sternal precautions, pt with verbal understanding but requires verbal cues during function Restrictions Weight Bearing Restrictions: No Other Position/Activity Restrictions: sternal precautions    Mobility  Bed Mobility               General bed mobility comments: Pt seated in recliner on arrival.    Transfers Overall transfer level: Needs assistance Equipment used: Rolling walker (2 wheeled) Transfers: Sit to/from Stand Sit to Stand: Min assist         General transfer comment: pt holding onto heart pillow and coming forward enough to use legs to power  up  Ambulation/Gait Ambulation/Gait assistance: Min guard Gait Distance (Feet): 300 Feet Assistive device: Rolling walker (2 wheeled) Gait Pattern/deviations: Step-through pattern;Decreased step length - right;Decreased step length - left;Decreased stride length Gait velocity: slow   General Gait Details: Cues for upper trunk and head control.     Stairs             Wheelchair Mobility    Modified Rankin (Stroke Patients Only)       Balance Overall balance assessment: Needs assistance   Sitting balance-Leahy Scale: Good       Standing balance-Leahy Scale: Fair                              Cognition Arousal/Alertness: Awake/alert Behavior During Therapy: WFL for tasks assessed/performed Overall Cognitive Status: Within Functional Limits for tasks assessed                                 General Comments: Pt reports that his brother can assist at home.      Exercises      General Comments        Pertinent Vitals/Pain Pain Assessment: Faces Faces Pain Scale: Hurts a little bit Pain Location: chest at op site when he coughs Pain Descriptors / Indicators: Sore;Tender;Operative site guarding Pain Intervention(s): Monitored during session    Home Living                      Prior Function            PT Goals (current goals can now be  found in the care plan section) Acute Rehab PT Goals Patient Stated Goal: to go home Potential to Achieve Goals: Good Progress towards PT goals: Progressing toward goals    Frequency    Min 3X/week      PT Plan Current plan remains appropriate    Co-evaluation              AM-PAC PT "6 Clicks" Mobility   Outcome Measure  Help needed turning from your back to your side while in a flat bed without using bedrails?: A Lot Help needed moving from lying on your back to sitting on the side of a flat bed without using bedrails?: A Lot Help needed moving to and from a bed to  a chair (including a wheelchair)?: A Lot Help needed standing up from a chair using your arms (e.g., wheelchair or bedside chair)?: A Lot Help needed to walk in hospital room?: A Little Help needed climbing 3-5 steps with a railing? : A Little 6 Click Score: 14    End of Session Equipment Utilized During Treatment: Gait belt Activity Tolerance: Patient tolerated treatment well;Patient limited by fatigue Patient left: with call bell/phone within reach;in chair Nurse Communication: Mobility status PT Visit Diagnosis: Unsteadiness on feet (R26.81);Other abnormalities of gait and mobility (R26.89);Muscle weakness (generalized) (M62.81);Other (comment)     Time: [id number] PT Time Calculation (min) (ACUTE ONLY): 19 min  Charges:  $Gait Training: 8-22 mins                     Joycelyn RuaAimee Kaycee Mcgaugh, PTA Acute Rehabilitation Services Pager [phone] Office [phone]     Florestine Aversimee J Loki Wuthrich 09/11/2018, 5:28 PM

## 2018-09-11 NOTE — Progress Notes (Signed)
Cardiac Rehab Advisory Cardiac Rehab Phase I is not seeing pts face to face at this time due to Covid 19 restrictions. Ambulation is occurring through nursing, PT, and mobility teams. We will help facilitate that process as needed. We are calling pts in their rooms and discussing education. We will then deliver education materials to pts RN for delivery to pt.   Called pt however no answer. Went to room and delivered exercise guidelines. Pt needed assist getting to EOB so helped him roll and sit on edge for lunch. Discussed walking at home, IS, sternal precautions. Voiced understanding. He is awaiting PT to ambulate. Doing well. [phone] Ethelda Chick CES, ACSM 2:35 PM 09/11/2018

## 2018-09-11 NOTE — Progress Notes (Addendum)
[address]       [place] [id number]             [phone]      6 Days Post-Op Procedure(s) (LRB): CORONARY ARTERY BYPASS GRAFTING (CABG) x4, ON PUMP, USING LEFT INTERNAL MAMMARY ARTERY AND RIGHT AND LEFT GREAT SAPHENOUS VEIN HARVESTED ENDOSCOPICALLY (N/A) TRANSESOPHAGEAL ECHOCARDIOGRAM (TEE) (N/A) Subjective: Feels sore, but conts to improve  Objective: Vital signs in last 24 hours: Temp:  [98 F (36.7 C)-98.7 F (37.1 C)] 98.7 F (37.1 C) (05/21 0424) Pulse Rate:  [66-80] 80 (05/21 0656) Cardiac Rhythm: Normal sinus rhythm (05/21 0707) Resp:  [17-32] 32 (05/21 0656) BP: (123-131)/(73-85) 130/76 (05/21 0424) SpO2:  [93 %-97 %] 93 % (05/21 0656) Weight:  [94.3 kg] 94.3 kg (05/21 0656)  Hemodynamic parameters for last 24 hours:    Intake/Output from previous day: 05/20 0701 - 05/21 0700 In: 360 [P.O.:360] Out: -  Intake/Output this shift: No intake/output data recorded.  General appearance: alert, cooperative and no distress Heart: regular rate and rhythm Lungs: clear to auscultation bilaterally Abdomen: soft, nontender Extremities: minor edema Wound: incis healing well  Lab Results: Recent Labs    09/10/18 0250 09/11/18 0231  WBC 7.9 7.5  HGB 8.7* 9.8*  HCT 25.9* 29.2*  PLT 215 260   BMET:  Recent Labs    09/10/18 0250 09/11/18 0231  NA 137 138  K 3.3* 3.2*  CL 101 100  CO2 26 29  GLUCOSE 213* 178*  BUN 34* 31*  CREATININE 1.23 1.19  CALCIUM 8.1* 8.4*    PT/INR: No results for input(s): LABPROT, INR in the last 72 hours. ABG    Component Value Date/Time   PHART 7.435 09/07/2018 0440   HCO3 22.9 09/07/2018 0440   TCO2 24 09/07/2018 0440   ACIDBASEDEF 1.0 09/07/2018 0440   O2SAT 56.4 09/08/2018 1010   CBG (last 3)  Recent Labs    09/10/18 1113 09/10/18 1615 09/11/18 0649  GLUCAP 283* 183* 148*    Meds Scheduled Meds: . aspirin EC  81 mg Oral Daily  . bisacodyl  10 mg Oral Daily   Or  . bisacodyl  10 mg  Rectal Daily  . carvedilol  3.125 mg Oral BID WC  . clopidogrel  75 mg Oral Daily  . enoxaparin (LOVENOX) injection  40 mg Subcutaneous QHS  . glimepiride  1 mg Oral Q breakfast  . hydrochlorothiazide  25 mg Oral Daily  . insulin aspart  0-15 Units Subcutaneous TID WC  . insulin aspart  0-5 Units Subcutaneous QHS  . pantoprazole  40 mg Oral Daily  . potassium chloride  40 mEq Oral BID  . simethicone  80 mg Oral QID  . sodium chloride flush  3 mL Intravenous Q12H   Continuous Infusions: . sodium chloride     PRN Meds:.sodium chloride, levalbuterol, ondansetron (ZOFRAN) IV, oxyCODONE, sodium chloride flush, traMADol  Xrays Dg Chest 2 View  Result Date: 09/10/2018 CLINICAL DATA:  CABG EXAM: CHEST - 2 VIEW COMPARISON:  Yesterday FINDINGS: Cardiomegaly and aortic tortuosity. Small pleural effusions. There is hyperinflation with diaphragm flattening. No Kerley lines or air bronchogram. Mild atelectasis at the bases. IMPRESSION: Atelectasis and small pleural effusions.  Aeration is improving. Electronically Signed   By: Marnee SpringJonathon  Watts M.D.   On: 09/10/2018 10:26    Assessment/Plan: S/P Procedure(s) (LRB): CORONARY ARTERY BYPASS GRAFTING (CABG) x4, ON PUMP, USING LEFT INTERNAL MAMMARY ARTERY AND RIGHT AND LEFT GREAT SAPHENOUS VEIN HARVESTED  ENDOSCOPICALLY (N/A) TRANSESOPHAGEAL ECHOCARDIOGRAM (TEE) (N/A)  1 remains hemodyn stable in sinus with PVC's, chronic. One short burst of Vtach. Cont current RX, replace K+ 2 BS variable, restart januvia and metformin 3 H/H improved 4 renal fxn improved 5 he doesn't want CIR , should be stable for discharge 1-2 days 6 remove epw's 7 cont O2 wean and pulm toilet, rehab    LOS: 6 days    Rowe Clack [facility] 09/11/2018 Pager [phone]  plan DC home with [facility] for restorative care in am hopefully on room air. Stop lasix patient examined and medical record reviewed,agree with above note. Kathlee Nations Trigt III 09/11/2018

## 2018-09-11 NOTE — Plan of Care (Signed)
  Problem: Health Behavior/Discharge Planning: Goal: Ability to manage health-related needs will improve Outcome: Progressing   Problem: Clinical Measurements: Goal: Will remain free from infection Outcome: Progressing Goal: Respiratory complications will improve Outcome: Progressing Goal: Cardiovascular complication will be avoided Outcome: Progressing   Problem: Nutrition: Goal: Adequate nutrition will be maintained Outcome: Progressing   Problem: Elimination: Goal: Will not experience complications related to bowel motility Outcome: Progressing Goal: Will not experience complications related to urinary retention Outcome: Progressing   Problem: Pain Managment: Goal: General experience of comfort will improve Outcome: Progressing

## 2018-09-11 NOTE — Progress Notes (Signed)
Notified by CCMD that patient had 8 beat run nonsustained vtach. Patient denies SOB or CP. Patient resting comfortably. Will continue to monitor

## 2018-09-11 NOTE — Progress Notes (Signed)
Pacing wires removed at this time. Patient tolerated well. Patient instructed on bed rest for an hour. Patient verbalizes understanding. Will continue to monitor.   Ernestina Columbia, RN

## 2018-09-11 NOTE — Progress Notes (Signed)
Patient ambulated in the hall 470 ft using a front wheel walker with RN. Patient tolerated well.   Ernestina Columbia, RN

## 2018-09-11 NOTE — Progress Notes (Signed)
Inpatient Rehab Admissions:  Inpatient Rehab Consult received.  I met with patient at the bedside for rehabilitation assessment and to discuss goals and expectations of an inpatient rehab admission.  Note he is currently ambulating with minguard/supervision greater than 400' and does not meet criteria for an inpatient rehab admission.  Pt states he's glad to hear that because he decided he would rather go home anyway.  Will sign off at this time.   Signed: Shann Medal, PT, DPT Admissions Coordinator [phone] 09/11/18  12:37 PM

## 2018-09-11 NOTE — Plan of Care (Signed)
  Problem: Health Behavior/Discharge Planning: Goal: Ability to manage health-related needs will improve Outcome: Progressing   Problem: Clinical Measurements: Goal: Will remain free from infection Outcome: Progressing Goal: Respiratory complications will improve Outcome: Progressing Goal: Cardiovascular complication will be avoided Outcome: Progressing   Problem: Nutrition: Goal: Adequate nutrition will be maintained Outcome: Progressing   

## 2018-09-12 LAB — CBC
HCT: 28.7 % — ABNORMAL LOW (ref 39.0–52.0)
Hemoglobin: 9.8 g/dL — ABNORMAL LOW (ref 13.0–17.0)
MCH: 30.2 pg (ref 26.0–34.0)
MCHC: 34.1 g/dL (ref 30.0–36.0)
MCV: 88.3 fL (ref 80.0–100.0)
Platelets: 294 10*3/uL (ref 150–400)
RBC: 3.25 MIL/uL — ABNORMAL LOW (ref 4.22–5.81)
RDW: 14.1 % (ref 11.5–15.5)
WBC: 8.9 10*3/uL (ref 4.0–10.5)
nRBC: 0.4 % — ABNORMAL HIGH (ref 0.0–0.2)

## 2018-09-12 LAB — BASIC METABOLIC PANEL
Anion gap: 13 (ref 5–15)
BUN: 27 mg/dL — ABNORMAL HIGH (ref 8–23)
CO2: 24 mmol/L (ref 22–32)
Calcium: 8.9 mg/dL (ref 8.9–10.3)
Chloride: 100 mmol/L (ref 98–111)
Creatinine, Ser: 1.2 mg/dL (ref 0.61–1.24)
GFR calc Af Amer: >60 mL/min (ref >60)
GFR calc non Af Amer: >60 mL/min (ref >60)
Glucose, Bld: 182 mg/dL — ABNORMAL HIGH (ref 70–99)
Potassium: 3.4 mmol/L — ABNORMAL LOW (ref 3.5–5.1)
Sodium: 137 mmol/L (ref 135–145)

## 2018-09-12 LAB — GLUCOSE, CAPILLARY
Glucose-Capillary: 134 mg/dL — ABNORMAL HIGH (ref 70–99)
Glucose-Capillary: 210 mg/dL — ABNORMAL HIGH (ref 70–99)

## 2018-09-12 MED ORDER — CARVEDILOL 3.125 MG PO TABS: 3.1250 mg | ORAL_TABLET | Freq: Two times a day (BID) | ORAL | 1 refills | Status: DC

## 2018-09-12 MED ORDER — CLOPIDOGREL BISULFATE 75 MG PO TABS: 75.0000 mg | ORAL_TABLET | Freq: Every day | ORAL | 1 refills | Status: DC

## 2018-09-12 MED ORDER — POTASSIUM CHLORIDE CRYS ER 20 MEQ PO TBCR: 40.0000 meq | EXTENDED_RELEASE_TABLET | Freq: Two times a day (BID) | ORAL | 0 refills | Status: DC

## 2018-09-12 MED ORDER — OXYCODONE HCL 5 MG PO TABS: 5.0000 mg | ORAL_TABLET | Freq: Four times a day (QID) | ORAL | 0 refills | Status: AC | PRN

## 2018-09-12 NOTE — Progress Notes (Addendum)
[address]       [place] [id number]             [phone]      7 Days Post-Op Procedure(s) (LRB): CORONARY ARTERY BYPASS GRAFTING (CABG) x4, ON PUMP, USING LEFT INTERNAL MAMMARY ARTERY AND RIGHT AND LEFT GREAT SAPHENOUS VEIN HARVESTED ENDOSCOPICALLY (N/A) TRANSESOPHAGEAL ECHOCARDIOGRAM (TEE) (N/A) Subjective: Feels well, off O2 with adeq sats  Objective: Vital signs in last 24 hours: Temp:  [98.1 F (36.7 C)-98.4 F (36.9 C)] 98.3 F (36.8 C) (05/22 0448) Pulse Rate:  [67-73] 67 (05/22 0448) Cardiac Rhythm: Normal sinus rhythm;Bundle branch block (05/21 2111) Resp:  [15-29] 18 (05/22 0448) BP: (116-129)/(73-81) 122/73 (05/22 0448) SpO2:  [93 %-95 %] 93 % (05/22 0448) Weight:  [94.3 kg] 94.3 kg (05/22 0448)  Hemodynamic parameters for last 24 hours:    Intake/Output from previous day: 05/21 0701 - 05/22 0700 In: 360 [P.O.:360] Out: 1453 [Urine:1450; Stool:3] Intake/Output this shift: No intake/output data recorded.  General appearance: alert, cooperative and no distress Heart: regular rate and rhythm and occ extrasystole Lungs: mildly dim in bases Abdomen: soft, non-tender Extremities: + LE edema Wound: incis healing well  Lab Results: Recent Labs    09/11/18 0231 09/12/18 0252  WBC 7.5 8.9  HGB 9.8* 9.8*  HCT 29.2* 28.7*  PLT 260 294   BMET:  Recent Labs    09/11/18 0231 09/12/18 0252  NA 138 137  K 3.2* 3.4*  CL 100 100  CO2 29 24  GLUCOSE 178* 182*  BUN 31* 27*  CREATININE 1.19 1.20  CALCIUM 8.4* 8.9    PT/INR: No results for input(s): LABPROT, INR in the last 72 hours. ABG    Component Value Date/Time   PHART 7.435 09/07/2018 0440   HCO3 22.9 09/07/2018 0440   TCO2 24 09/07/2018 0440   ACIDBASEDEF 1.0 09/07/2018 0440   O2SAT 56.4 09/08/2018 1010   CBG (last 3)  Recent Labs    09/11/18 1635 09/11/18 2135 09/12/18 0613  GLUCAP 159* 181* 134*    Meds Scheduled Meds: . aspirin EC  81 mg Oral Daily  .  bisacodyl  10 mg Oral Daily   Or  . bisacodyl  10 mg Rectal Daily  . carvedilol  3.125 mg Oral BID WC  . clonazePAM  0.5 mg Oral QHS  . clopidogrel  75 mg Oral Daily  . enoxaparin (LOVENOX) injection  40 mg Subcutaneous QHS  . glimepiride  1 mg Oral Q breakfast  . hydrochlorothiazide  25 mg Oral Daily  . insulin aspart  0-15 Units Subcutaneous TID WC  . insulin aspart  0-5 Units Subcutaneous QHS  . linagliptin  5 mg Oral Daily  . metFORMIN  500 mg Oral BID WC  . pantoprazole  40 mg Oral Daily  . potassium chloride  40 mEq Oral BID  . simethicone  80 mg Oral QID  . sodium chloride flush  3 mL Intravenous Q12H   Continuous Infusions: . sodium chloride     PRN Meds:.sodium chloride, levalbuterol, ondansetron (ZOFRAN) IV, oxyCODONE, sodium chloride flush, traMADol  Xrays Dg Chest 2 View  Result Date: 09/10/2018 CLINICAL DATA:  CABG EXAM: CHEST - 2 VIEW COMPARISON:  Yesterday FINDINGS: Cardiomegaly and aortic tortuosity. Small pleural effusions. There is hyperinflation with diaphragm flattening. No Kerley lines or air bronchogram. Mild atelectasis at the bases. IMPRESSION: Atelectasis and small pleural effusions.  Aeration is improving. Electronically Signed   By: Kathrynn DuckingJonathon  Watts M.D.  On: 09/10/2018 10:26    Assessment/Plan: S/P Procedure(s) (LRB): CORONARY ARTERY BYPASS GRAFTING (CABG) x4, ON PUMP, USING LEFT INTERNAL MAMMARY ARTERY AND RIGHT AND LEFT GREAT SAPHENOUS VEIN HARVESTED ENDOSCOPICALLY (N/A) TRANSESOPHAGEAL ECHOCARDIOGRAM (TEE) (N/A)  1 conts to progress well 2 hemodyn stable , chronic PVC's, replace K+ 3 BS ok, cont home meds, close f/u with primary 4 H/H pretty stable 5 sats ok off O2, cont IS/Pulm toilet at home 6 stable for discharge  LOS: 7 days    Rowe Clack [facility] 09/12/2018 Pager [phone]  patient examined and medical record reviewed,agree with above note. Kathlee Nations Trigt III 09/12/2018

## 2018-09-12 NOTE — TOC Transition Note (Addendum)
Transition of Care [facility]) - CM/SW Discharge Note Donn Pierini RN, BSN Transitions of Care Unit 4E- RN Case Manager [phone]   Patient Details  Name: Jerry Myers MRN: [id number] Date of Birth: Jan 29, 1951  Transition of Care [facility]) CM/SW Contact:  Darrold Span, RN Phone Number: 09/12/2018, 10:24 AM   Clinical Narrative:    Pt admitted s/p CABGx4, stable for transition home today, order placed for [facility], and DME-RW. CM spoke with pt at bedside- choice offered for  [facility] needs- list provided Per CMS guidelines from medicare.gov website with star ratings (copy placed in shadow chart)- per pt he does not have a preference and ask CM to "just find one that will come to my home" pt reports that he has an aide that comes m-f for 3hrs a day for housekeeping needs, and a nurse comes 2/mo to fill his pill box. - Call made to [facility] with [facility] for [facility] referral.- referral accepted- requested to add HHPT per PT eval recommendation.  Call also made to [facility] with [facility] for DME needs- RW to be delivered to room prior to discharge. Pt reports he has transportation home and no further needs.    Final next level of care: Home w Home Health Services Barriers to Discharge: No Barriers Identified   Patient Goals and CMS Choice Patient states their goals for this hospitalization and ongoing recovery are:: "to go home" CMS Medicare.gov Compare Post Acute Care list provided to:: Patient Choice offered to / list presented to : Patient  Discharge Placement  Home with [facility]                     Discharge Plan and Services   Discharge Planning Services: CM Consult Post Acute Care Choice: Durable Medical Equipment, Home Health          DME Arranged: Walker rolling DME Agency: [facility] Date DME Agency Contacted: 09/12/18 Time DME Agency Contacted: 1023 Representative spoke with at DME Agency: Oletha Cruel HH Arranged: RN PT [facility] Agency: [facility] Date HH Agency  Contacted: 09/12/18 Time HH Agency Contacted: [phone] Representative spoke with at [facility] Agency: Kizzie Furnish  Social Determinants of Health (SDOH) Interventions     Readmission Risk Interventions Readmission Risk Prevention Plan 09/12/2018  Post Dischage Appt Complete  Medication Screening Complete  Transportation Screening Complete

## 2018-09-12 NOTE — Progress Notes (Signed)
Physical Therapy Treatment Patient Details Name: Jerry Myers MRN: [id number] DOB: May 16, 1950 Today's Date: 09/12/2018    History of Present Illness 68 year old diabetic with hypertension and family history of CABG presents after recent cardiac catheterization by DrEndshowing three-vessel CAD in a diabetic pattern with moderate LV dysfunction by echo with EF 50% and moderate MR. Patient symptoms are dyspnea with exertion; now s/p CABG x4;  has a past medical history of Anxiety, Arthritis, Coronary artery disease, Coronary artery disease of native artery of native heart with stable angina pectoris (HCC) (03/18/2015), Depression, Dyspnea    PT Comments    Patient is making great progress. He has 3 steps into his house and was able to complete 3 steps without difficulty. He re-stated his sternal precautions and was safe with sit to stand transfer. He will have assistance at home. He was encouraged to keep walking at home. Patient will likely discharge today.   Follow Up Recommendations  Home health PT;Supervision/Assistance - 24 hour;Other (comment)     Equipment Recommendations  Rolling walker with 5" wheels;3in1 (PT)    Recommendations for Other Services OT consult     Precautions / Restrictions Precautions Precautions: Sternal;Fall Precaution Booklet Issued: Yes (comment) Precaution Comments: re-educated on the sternal precautions, pt with verbal understanding but requires verbal cues during function Restrictions Weight Bearing Restrictions: Yes    Mobility  Bed Mobility               General bed mobility comments: Pt seated in recliner on arrival.    Transfers Overall transfer level: Needs assistance   Transfers: Sit to/from Stand Sit to Stand: Min guard;Min assist         General transfer comment: Min gaurd on the first trial. SMall amount of assist reqwuired on the second trial  Ambulation/Gait Ambulation/Gait assistance: Supervision Gait Distance  (Feet): 150 Feet Assistive device: Rolling walker (2 wheeled) Gait Pattern/deviations: Step-through pattern;Decreased step length - right;Decreased step length - left;Decreased stride length Gait velocity: slow Gait velocity interpretation: <1.31 ft/sec, indicative of household ambulator General Gait Details: less distance walked because he had done stairs. Slow but steady gait pattern.    Stairs Stairs: Yes Stairs assistance: Min guard Stair Management: One rail Left Number of Stairs: 3 General stair comments: Patient went up and down 3 steps without difficulty. He had no dyspne or syncope. He initaslly truied to use the walker going up the steps but was adavised to use 1 rail and 1 hand heled assist.    Wheelchair Mobility    Modified Rankin (Stroke Patients Only)       Balance Overall balance assessment: Needs assistance Sitting-balance support: No upper extremity supported;Feet supported       Standing balance support: Bilateral upper extremity supported Standing balance-Leahy Scale: Poor                              Cognition Arousal/Alertness: Awake/alert Behavior During Therapy: WFL for tasks assessed/performed Overall Cognitive Status: Within Functional Limits for tasks assessed                                        Exercises      General Comments        Pertinent Vitals/Pain Pain Assessment: No/denies pain    Home Living  Prior Function            PT Goals (current goals can now be found in the care plan section) Acute Rehab PT Goals Patient Stated Goal: to go home PT Goal Formulation: With patient Time For Goal Achievement: 09/21/18 Potential to Achieve Goals: Good Progress towards PT goals: Progressing toward goals    Frequency    Min 3X/week      PT Plan Current plan remains appropriate    Co-evaluation              AM-PAC PT "6 Clicks" Mobility   Outcome Measure   Help needed turning from your back to your side while in a flat bed without using bedrails?: None Help needed moving from lying on your back to sitting on the side of a flat bed without using bedrails?: A Little Help needed moving to and from a bed to a chair (including a wheelchair)?: A Little Help needed standing up from a chair using your arms (e.g., wheelchair or bedside chair)?: A Little Help needed to walk in hospital room?: None Help needed climbing 3-5 steps with a railing? : A Little 6 Click Score: 20    End of Session Equipment Utilized During Treatment: Gait belt Activity Tolerance: Patient tolerated treatment well;Patient limited by fatigue Patient left: with call bell/phone within reach;in chair Nurse Communication: Mobility status PT Visit Diagnosis: Unsteadiness on feet (R26.81);Other abnormalities of gait and mobility (R26.89);Muscle weakness (generalized) (M62.81);Other (comment)     Time: [id number] PT Time Calculation (min) (ACUTE ONLY): 21 min  Charges:  $Gait Training: 8-22 mins                       Dessie Comaavid J Delane Wessinger PT DPT  09/12/2018, 10:00 AM

## 2018-09-12 NOTE — Progress Notes (Signed)
Pt discharged per MD order. CT sutures removed, PIV removed. Tele removed. Discharge instructions,medications and appointments reviewed with pt. Pt verbalized understanding. Pt escorted to private vehicle by volunteer.

## 2018-09-26 ENCOUNTER — Other Ambulatory Visit: Payer: Self-pay | Admitting: Cardiothoracic Surgery

## 2018-09-26 DIAGNOSIS — E1169 Type 2 diabetes mellitus with other specified complication: Secondary | ICD-10-CM

## 2018-09-26 DIAGNOSIS — I25118 Atherosclerotic heart disease of native coronary artery with other forms of angina pectoris: Secondary | ICD-10-CM

## 2018-10-03 DIAGNOSIS — Z951 Presence of aortocoronary bypass graft: Secondary | ICD-10-CM

## 2018-10-06 ENCOUNTER — Other Ambulatory Visit: Payer: Self-pay | Admitting: Cardiothoracic Surgery

## 2018-10-06 ENCOUNTER — Ambulatory Visit
Admission: RE | Admit: 2018-10-06 | Discharge: 2018-10-06 | Disposition: A | Discharge status: Home / Self Care | Payer: Medicare Other | Source: Ambulatory Visit | Attending: Cardiothoracic Surgery | Admitting: Cardiothoracic Surgery

## 2018-10-06 ENCOUNTER — Other Ambulatory Visit: Payer: Self-pay

## 2018-10-06 ENCOUNTER — Ambulatory Visit (INDEPENDENT_AMBULATORY_CARE_PROVIDER_SITE_OTHER): Payer: Self-pay | Admitting: Surgical

## 2018-10-06 VITALS — BP 145/78 | HR 69 | Temp 97.7°F | Resp 20 | Ht 68.0 in | Wt 204.0 lb

## 2018-10-06 DIAGNOSIS — I25118 Atherosclerotic heart disease of native coronary artery with other forms of angina pectoris: Secondary | ICD-10-CM

## 2018-10-06 DIAGNOSIS — Z951 Presence of aortocoronary bypass graft: Secondary | ICD-10-CM

## 2018-10-06 NOTE — Progress Notes (Signed)
[address]       [place] [id number]             [phone]      Meta HatchetWilliam A Botkins [facility] Medical Record #[id number] Date of Birth: [birth year]  Referring: Revankar, Aundra Dubinajan R, MD Primary Care: Mikael SprayMcRae, Lauren, NP Primary Cardiologist: No primary care provider on file.   Chief Complaint:   POST OP FOLLOW UP OPERATIVE REPORT  DATE OF PROCEDURE:  09/05/2018  OPERATION: 1.  Coronary artery bypass grafting x4 (left internal mammary artery to left anterior descending, saphenous vein graft to OM1, saphenous vein graft to distal posterolateral circumflex, saphenous vein graft to posterior descending). 2.  Endoscopic harvest of right leg and left leg greater saphenous vein.  SURGEON:  Kerin PernaPeter Van Trigt, MD  ASSISTANT:  Gershon CraneWayne Adamae Ricklefs, PA-C.  ANESTHESIA:  General.  PREOPERATIVE DIAGNOSES:  Severe 3-vessel coronary artery disease, poorly managed diabetes, moderate left ventricular dysfunction, class III angina.  POSTOPERATIVE DIAGNOSES:  Severe 3-vessel coronary artery disease, poorly managed diabetes, moderate ventricular dysfunction, class III angina. History of Present Illness:    Patient is a 68 year old male status post the above described procedure seen in the office on today's date and routine postsurgical follow-up.  Overall the patient reports that he is making good progress.  He is no longer taking any pain medication.  He denies chest pain or anginal equivalents.  He is not having any shortness of breath.  He has had no difficulties with his incisions.  He has had no fevers, chills or other significant constitutional symptoms.  Overall he feels as though he is making good progress in his recovery.  He does state that his blood sugars have been under better control than previously as well.        Past Medical History:  Diagnosis Date  . Anxiety   . Arthritis   . Cardiomyopathy (HCC) 04/19/2015   Ejection fraction 4045% in the fall of  2016  . Cataract    right eye  . Coronary artery disease   . Coronary artery disease of native artery of native heart with stable angina pectoris (HCC) 03/18/2015  . Depression   . Dyspnea   . Essential hypertension 12/29/2014  . GERD (gastroesophageal reflux disease)   . History of kidney stones    20 yrs. ago  . Type 2 diabetes mellitus without complication (HCC) 12/29/2014  . Ventricular extrasystoles 12/29/2014     Social History   Tobacco Use  Smoking Status Never Smoker  Smokeless Tobacco Never Used    Social History   Substance and Sexual Activity  Alcohol Use Never  . Frequency: Never     No Known Allergies  Current Outpatient Medications  Medication Sig Dispense Refill  . aspirin EC 81 MG tablet Take 81 mg by mouth daily.    Marland Kitchen. atorvastatin (LIPITOR) 20 MG tablet Take 20 mg by mouth daily.    . carvedilol (COREG) 3.125 MG tablet Take 1 tablet (3.125 mg total) by mouth 2 (two) times daily with a meal. 60 tablet 1  . clopidogrel (PLAVIX) 75 MG tablet Take 1 tablet (75 mg total) by mouth daily. 60 tablet 1  . escitalopram (LEXAPRO) 5 MG tablet Take 5 mg by mouth daily.    Marland Kitchen. gabapentin (NEURONTIN) 300 MG capsule Take 300 mg by mouth 2 (two) times daily.    Marland Kitchen. glimepiride (AMARYL) 1 MG tablet Take 1 mg by mouth daily with breakfast.    .  hydrochlorothiazide (HYDRODIURIL) 25 MG tablet Take 25 mg by mouth daily.    . metFORMIN (GLUCOPHAGE) 500 MG tablet Take 1 tablet (500 mg total) by mouth 2 (two) times daily.    . potassium chloride SA (K-DUR) 20 MEQ tablet Take 2 tablets (40 mEq total) by mouth 2 (two) times daily. 4 tablet 0  . sitaGLIPtin (JANUVIA) 100 MG tablet Take 100 mg by mouth daily.     No current facility-administered medications for this visit.        Physical Exam: BP (!) 145/78   Pulse 69   Temp 97.7 F (36.5 C) (Skin)   Resp 20   Ht 5\' 8"  (1.727 m)   Wt 92.5 kg   SpO2 96% Comment: RA  BMI 31.02 kg/m   General appearance: alert, cooperative  and no distress Heart: regular rate and rhythm Lungs: clear to auscultation bilaterally Abdomen: soft, non-tender; bowel sounds normal; no masses,  no organomegaly Extremities: Minor lower extremity edema Wound: Incisions well-healed without evidence of infection   Diagnostic Studies & Laboratory data:     Recent Radiology Findings:   Dg Chest 2 View  Result Date: 10/06/2018 CLINICAL DATA:  History of CABG. EXAM: CHEST - 2 VIEW COMPARISON:  09/10/2018 FINDINGS: The heart size is mildly enlarged. The patient is status post prior median sternotomy. There is some blunting of the left costophrenic angle which appears stable from prior study. There are few scattered linear airspace opacities bilaterally there is a small rounded density at the right lower lobe, likely stable across prior studies. IMPRESSION: No active cardiopulmonary disease. Electronically Signed   By: Constance Holster M.D.   On: 10/06/2018 13:31      Recent Lab Findings: Lab Results  Component Value Date   WBC 8.9 09/12/2018   HGB 9.8 (L) 09/12/2018   HCT 28.7 (L) 09/12/2018   PLT 294 09/12/2018   GLUCOSE 182 (H) 09/12/2018   CHOL 123 06/20/2018   TRIG 124 06/20/2018   HDL 42 06/20/2018   LDLCALC 60 06/20/2018   ALT 21 09/09/2018   AST 16 09/09/2018   NA 137 09/12/2018   K 3.4 (L) 09/12/2018   CL 100 09/12/2018   CREATININE 1.20 09/12/2018   BUN 27 (H) 09/12/2018   CO2 24 09/12/2018   INR 1.4 (H) 09/05/2018   HGBA1C 9.2 (H) 09/03/2018      Assessment / Plan: Patient is doing well.  There are currently no significant surgical issues.  He was given verbal instructions regarding activity progression including driving.  He still needs to see his cardiologist Dr. Geraldo Pitter in [place].  His chest x-ray shows clear lung fields without any significant effusions or infiltrates.  I made no changes to his current medical regimen.  We discussed long-term lifestyle and diet management as related to diabetes.  He does  understand these are important for long-term management of coronary artery disease.  We will see the patient again on a as needed basis for any surgically related issues or at request.      Medication Changes: No orders of the defined types were placed in this encounter.     John Giovanni, PA-C 10/06/2018 2:32 PM

## 2018-10-06 NOTE — Patient Instructions (Signed)
As discussed in office for long-term diabetes management as well as activity progression including driving.

## 2019-01-26 ENCOUNTER — Encounter: Payer: Self-pay | Admitting: Cardiology

## 2019-01-26 ENCOUNTER — Ambulatory Visit (INDEPENDENT_AMBULATORY_CARE_PROVIDER_SITE_OTHER): Payer: Medicare Other | Admitting: Cardiology

## 2019-01-26 ENCOUNTER — Other Ambulatory Visit: Payer: Self-pay

## 2019-01-26 VITALS — BP 170/100 | HR 58 | Ht 68.0 in | Wt 212.6 lb

## 2019-01-26 DIAGNOSIS — E119 Type 2 diabetes mellitus without complications: Secondary | ICD-10-CM

## 2019-01-26 DIAGNOSIS — I2510 Atherosclerotic heart disease of native coronary artery without angina pectoris: Secondary | ICD-10-CM | POA: Insufficient documentation

## 2019-01-26 DIAGNOSIS — I10 Essential (primary) hypertension: Secondary | ICD-10-CM

## 2019-01-26 DIAGNOSIS — I472 Ventricular tachycardia: Secondary | ICD-10-CM | POA: Diagnosis not present

## 2019-01-26 DIAGNOSIS — Z951 Presence of aortocoronary bypass graft: Secondary | ICD-10-CM

## 2019-01-26 DIAGNOSIS — I4729 Other ventricular tachycardia: Secondary | ICD-10-CM

## 2019-01-26 HISTORY — DX: Atherosclerotic heart disease of native coronary artery without angina pectoris: I25.10

## 2019-01-26 MED ORDER — LISINOPRIL-HYDROCHLOROTHIAZIDE 10-12.5 MG PO TABS: 1.0000 | ORAL_TABLET | Freq: Every day | ORAL | 4 refills | Status: DC

## 2019-01-26 NOTE — Patient Instructions (Signed)
Medication Instructions:  Your physician has recommended you make the following change in your medication:   START taking lisinopril-HCTZ1 tablet once daily   If you need a refill on your cardiac medications before your next appointment, please call your pharmacy.   Lab work: Your physician recommends that you have a BMP drawn today. If you have labs (blood work) drawn today and your tests are completely normal, you will receive your results only by: Marland Kitchen. MyChart Message (if you have MyChart) OR . A paper copy in the mail If you have any lab test that is abnormal or we need to change your treatment, we will call you to review the results.  Testing/Procedures: NONE  Follow-Up: At [facility], you and your health needs are our priority.  As part of our continuing mission to provide you with exceptional heart care, we have created designated Provider Care Teams.  These Care Teams include your primary Cardiologist (physician) and Advanced Practice Providers (APPs -  Physician Assistants and Nurse Practitioners) who all work together to provide you with the care you need, when you need it. You will need a follow up appointment in 1 months.   Any Other Special Instructions Will Be Listed Below Hydrochlorothiazide, HCTZ; Lisinopril tablets What is this medicine? HYDROCHLOROTHIAZIDE; LISINOPRIL (hye droe klor oh THYE a zide; lyse IN oh pril) is a combination of a diuretic and an ACE inhibitor. It is used to treat high blood pressure. This medicine may be used for other purposes; ask your health care provider or pharmacist if you have questions. COMMON BRAND NAME(S): Prinzide, Zestoretic What should I tell my health care provider before I take this medicine? They need to know if you have any of these conditions:  bone marrow disease  decreased urine  diabetes  heart or blood vessel disease  if you are on a special diet like a low salt diet  immune system problems, like lupus  kidney  disease  liver disease  previous swelling of the tongue, face, or lips with difficulty breathing, difficulty swallowing, hoarseness, or tightening of the throat  recent heart attack or stroke  an unusual or allergic reaction to lisinopril, hydrochlorothiazide, sulfa drugs, other medicines, insect venom, foods, dyes, or preservatives  pregnant or trying to get pregnant  breast-feeding How should I use this medicine? Take this medicine by mouth with a glass of water. Follow the directions on the prescription label. You can take it with or without food. If it upsets your stomach, take it with food. Take your medicine at regular intervals. Do not take it more often than directed. Do not stop taking except on your doctor's advice. Talk to your pediatrician regarding the use of this medicine in children. Special care may be needed. Overdosage: If you think you have taken too much of this medicine contact a poison control center or emergency room at once. NOTE: This medicine is only for you. Do not share this medicine with others. What if I miss a dose? If you miss a dose, take it as soon as you can. If it is almost time for your next dose, take only that dose. Do not take double or extra doses. What may interact with this medicine? Do not take this medication with any of the following medications:  sacubitril; valsartan This medicine may also interact with the following:  barbiturates like phenobarbital  blood pressure medicines  corticosteroids like prednisone  diabetic medications  diuretics, especially triamterene, spironolactone or amiloride  lithium  NSAIDs,  medicines for pain and inflammation, like ibuprofen or naproxen  potassium salts or potassium supplements  prescription pain medicines  skeletal muscle relaxants like tubocurarine  some cholesterol lowering medications like cholestyramine or colestipol This list may not describe all possible interactions. Give your  health care provider a list of all the medicines, herbs, non-prescription drugs, or dietary supplements you use. Also tell them if you smoke, drink alcohol, or use illegal drugs. Some items may interact with your medicine. What should I watch for while using this medicine? Visit your doctor or health care professional for regular checks on your progress. Check your blood pressure as directed. Ask your doctor or health care professional what your blood pressure should be and when you should contact him or her. Call your doctor or health care professional if you notice an irregular or fast heart beat. You must not get dehydrated. Ask your doctor or health care professional how much fluid you need to drink a day. Check with him or her if you get an attack of severe diarrhea, nausea and vomiting, or if you sweat a lot. The loss of too much body fluid can make it dangerous for you to take this medicine. Women should inform their doctor if they wish to become pregnant or think they might be pregnant. There is a potential for serious side effects to an unborn child. Talk to your health care professional or pharmacist for more information. You may get drowsy or dizzy. Do not drive, use machinery, or do anything that needs mental alertness until you know how this drug affects you. Do not stand or sit up quickly, especially if you are an older patient. This reduces the risk of dizzy or fainting spells. Alcohol can make you more drowsy and dizzy. Avoid alcoholic drinks. This medicine may increase blood sugar. Ask your healthcare provider if changes in diet or medicines are needed if you have diabetes. Avoid salt substitutes unless you are told otherwise by your doctor or health care professional. This medicine can make you more sensitive to the sun. Keep out of the sun. If you cannot avoid being in the sun, wear protective clothing and use sunscreen. Do not use sun lamps or tanning beds/booths. Do not treat yourself  for coughs, colds, or pain while you are taking this medicine without asking your doctor or health care professional for advice. Some ingredients may increase your blood pressure. What side effects may I notice from receiving this medicine? Side effects that you should report to your doctor or health care professional as soon as possible:  changes in vision  confusion, dizziness, light headedness or fainting spells  decreased amount of urine passed  difficulty breathing or swallowing, hoarseness, or tightening of the throat  eye pain  fast or irregular heart beat, palpitations, or chest pain  muscle cramps  nausea and vomiting  persistent dry cough  redness, blistering, peeling or loosening of the skin, including inside the mouth   signs and symptoms of high blood sugar such as being more thirsty or hungry or having to urinate more than normal. You may also feel very tired or have blurry vision.  stomach pain  swelling of your face, lips, tongue, hands, or feet  unusual rash, bleeding or bruising, or pinpoint red spots on the skin  worsened gout pain  yellowing of the eyes or skin Side effects that usually do not require medical attention (report to your doctor or health care professional if they continue or are bothersome):  change in sex drive or performance  cough  headache This list may not describe all possible side effects. Call your doctor for medical advice about side effects. You may report side effects to FDA at 1-800-FDA-1088. Where should I keep my medicine? Keep out of the reach of children. Store at room temperature between 20 and 25 degrees C (68 and 77 degrees F). Protect from moisture and excessive light. Keep container tightly closed. Throw away any unused medicine after the expiration date. NOTE: This sheet is a summary. It may not cover all possible information. If you have questions about this medicine, talk to your doctor, pharmacist, or health  care provider.  2020 [employer]vier/Gold Standard (2018-01-29 09:35:31)

## 2019-01-26 NOTE — Progress Notes (Signed)
Cardiology Office Note:    Date:  01/26/2019   ID:  Jerry Myers, DOB [birth year], MRN [id number]  PCP:  Welford Roche, NP  Cardiologist:  Jenean Lindau, MD   Referring MD: Welford Roche, NP    ASSESSMENT:    1. NSVT (nonsustained ventricular tachycardia) ([place])   2. S/P CABG x 4   3. Coronary artery disease involving native coronary artery of native heart without angina pectoris   4. Type 2 diabetes mellitus without complication, without long-term current use of insulin ([place])   5. Essential hypertension    PLAN:    In order of problems listed above:  1. Coronary artery disease: Secondary prevention stressed to the patient.  Importance of compliance with diet and medication stressed and he vocalized understanding.   2. Essential hypertension: His blood pressure is elevated.  I had an extensive discussion with him about diet and salt intake issues.  I will initiate him on lisinopril hydrochlorothiazide 10/12.5 mg daily and will have a Chem-7 today. 3. Diabetes mellitus: Diet was discussed extensively.  Importance of regular exercise stressed.  Patient will be seen in follow-up appointment in a month or earlier if he has any concerns.   Medication Adjustments/Labs and Tests Ordered: Current medicines are reviewed at length with the patient today.  Concerns regarding medicines are outlined above.  Orders Placed This Encounter  Procedures  . Basic Metabolic Panel (BMET)   Meds ordered this encounter  Medications  . lisinopril-hydrochlorothiazide (ZESTORETIC) 10-12.5 MG tablet    Sig: Take 1 tablet by mouth daily.    Dispense:  30 tablet    Refill:  4     Chief Complaint  Patient presents with  . Follow-up     History of Present Illness:    Jerry Myers is a 68 y.o. male.  Patient has past medical history of coronary artery disease post CABG surgery, essential hypertension, dyslipidemia and diabetes mellitus.  He leads a sedentary lifestyle and does not  appear to be very compliant with medical advice.  He denies any chest pain orthopnea or PND.  His sister-in-law accompanies him for this visit.  At the time of my evaluation, the patient is alert awake oriented and in no distress.  Past Medical History:  Diagnosis Date  . Anxiety   . Arthritis   . Cardiomyopathy ([place]) 04/19/2015   Ejection fraction 4045% in the fall of 2016  . Cataract    right eye  . Coronary artery disease   . Coronary artery disease of native artery of native heart with stable angina pectoris ([place]) 03/18/2015  . Depression   . Dyspnea   . Essential hypertension 12/29/2014  . GERD (gastroesophageal reflux disease)   . History of kidney stones    20 yrs. ago  . Type 2 diabetes mellitus without complication ([place]) 0/11/6576  . 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 CARDIAC CATHETERIZATION    . CORONARY ARTERY BYPASS GRAFT N/A 09/05/2018   Procedure: CORONARY ARTERY BYPASS GRAFTING (CABG) x4, ON PUMP, USING LEFT INTERNAL MAMMARY ARTERY AND RIGHT AND LEFT GREAT SAPHENOUS VEIN HARVESTED ENDOSCOPICALLY;  Surgeon: Ivin Poot, MD;  Location: [facility];  Service: Open Heart Surgery;  Laterality: N/A;  . FOOT SURGERY    . LEFT HEART CATH AND CORONARY ANGIOGRAPHY N/A 06/05/2018   Procedure: LEFT HEART CATH AND CORONARY ANGIOGRAPHY;  Surgeon: Nelva Bush, MD;  Location: [place] CV LAB;  Service: Cardiovascular;  Laterality:  N/A;  . LUNG SURGERY    . TEE WITHOUT CARDIOVERSION N/A 09/05/2018   Procedure: TRANSESOPHAGEAL ECHOCARDIOGRAM (TEE);  Surgeon: Donata Clay, Theron Arista, MD;  Location: [facility] OR;  Service: Open Heart Surgery;  Laterality: N/A;    Current Medications: Current Meds  Medication Sig  . aspirin EC 81 MG tablet Take 81 mg by mouth daily.  Marland Kitchen atorvastatin (LIPITOR) 20 MG tablet Take 20 mg by mouth daily.  . carvedilol (COREG) 3.125 MG tablet Take 1 tablet (3.125 mg total) by mouth 2 (two) times daily with a meal.   . clopidogrel (PLAVIX) 75 MG tablet Take 1 tablet (75 mg total) by mouth daily.  Marland Kitchen escitalopram (LEXAPRO) 5 MG tablet Take 5 mg by mouth daily.  Marland Kitchen gabapentin (NEURONTIN) 300 MG capsule Take 300 mg by mouth 2 (two) times daily.  Marland Kitchen glimepiride (AMARYL) 1 MG tablet Take 1 mg by mouth daily with breakfast.  . glipiZIDE (GLUCOTROL XL) 5 MG 24 hr tablet Take 5 mg by mouth daily with breakfast.  . hydrochlorothiazide (HYDRODIURIL) 25 MG tablet Take 25 mg by mouth daily.  . metFORMIN (GLUCOPHAGE) 500 MG tablet Take 1 tablet (500 mg total) by mouth 2 (two) times daily.  . sitaGLIPtin (JANUVIA) 100 MG tablet Take 100 mg by mouth daily.     Allergies:   Patient has no known allergies.   Social History   Socioeconomic History  . Marital status: Widowed    Spouse name: Not on file  . Number of children: Not on file  . Years of education: Not on file  . Highest education level: Not on file  Occupational History  . Not on file  Social Needs  . Financial resource strain: Not on file  . Food insecurity    Worry: Not on file    Inability: Not on file  . Transportation needs    Medical: Not on file    Non-medical: Not on file  Tobacco Use  . Smoking status: Never Smoker  . Smokeless tobacco: Never Used  Substance and Sexual Activity  . Alcohol use: Never    Frequency: Never  . Drug use: Not Currently  . Sexual activity: Not on file  Lifestyle  . Physical activity    Days per week: Not on file    Minutes per session: Not on file  . Stress: Not on file  Relationships  . Social Musician on phone: Not on file    Gets together: Not on file    Attends religious service: Not on file    Active member of club or organization: Not on file    Attends meetings of clubs or organizations: Not on file    Relationship status: Not on file  Other Topics Concern  . Not on file  Social History Narrative  . Not on file     Family History: The patient's family history includes  Arrhythmia in his brother and mother; Congestive Heart Failure in his brother; Diabetes in his father, mother, sister, and sister; Heart attack in his brother; Hypertension in his brother and father; Kidney disease in his brother; Other in his mother.  ROS:   Please see the history of present illness.    All other systems reviewed and are negative.  EKGs/Labs/Other Studies Reviewed:    The following studies were reviewed today: Study date: 06/05/18  Physicians  Panel Physicians Referring Physician Case Authorizing Physician  End, Cristal Deer, MD (Primary)    Procedures  LEFT HEART CATH AND  CORONARY ANGIOGRAPHY  Conclusion  Conclusions: 1. Significant three-vessel coronary artery disease, including diffuse LAD disease up to 70% (DFR of distal LAD is 0.71; cut-point for hemodynamic significance is 0.89), calcified 90% mid LCx stenosis as well as chronic occlusions of several OM branches, and chronic total occlusion of mid RCA with bridging and left-to-right collaterals. 2. Normal left ventricular filling pressure.  Recommendations: 1. Outpatient cardiac surgery consultation.  Though chest pain is atypical, he may not experience typical angina in the setting of diabetes mellitus.  He would benefit from CABG based on his coronary anatomy on history of diabetes mellitus. 2. Aggressive secondary prevention.    POST-OP IMPRESSIONS - Left Ventricle: There was peristent moderate LV dysfunction present on the post-bypass study. The inferior wall was hypokinetic and the ejection was estimated at 40-45%. - Aorta: The aorta appears unchanged from pre-bypass. - Left Atrial Appendage: The left atrial appendage appears unchanged from pre-bypass. - Aortic Valve: The aortic valve appears unchanged from pre-bypass. - Mitral Valve: The mitral valve appears unchanged from pre-bypass. - Tricuspid Valve: The tricuspid valve appears unchanged from pre-bypass.   Recent Labs: 09/09/2018: ALT 21;  Magnesium 2.2 09/12/2018: BUN 27; Creatinine, Ser 1.20; Hemoglobin 9.8; Platelets 294; Potassium 3.4; Sodium 137  Recent Lipid Panel    Component Value Date/Time   CHOL 123 06/20/2018 1215   TRIG 124 06/20/2018 1215   HDL 42 06/20/2018 1215   CHOLHDL 2.9 06/20/2018 1215   LDLCALC 60 06/20/2018 1215    Physical Exam:    VS:  BP (!) 170/100 (BP Location: Left Arm, Patient Position: Sitting, Cuff Size: Normal)   Pulse (!) 58   Ht 5\' 8"  (1.727 m)   Wt 212 lb 9.6 oz (96.4 kg)   SpO2 97%   BMI 32.33 kg/m     Wt Readings from Last 3 Encounters:  01/26/19 212 lb 9.6 oz (96.4 kg)  10/06/18 204 lb (92.5 kg)  09/12/18 207 lb 14.4 oz (94.3 kg)     GEN: Patient is in no acute distress HEENT: Normal NECK: No JVD; No carotid bruits LYMPHATICS: No lymphadenopathy CARDIAC: Hear sounds regular, 2/6 systolic murmur at the apex. RESPIRATORY:  Clear to auscultation without rales, wheezing or rhonchi  ABDOMEN: Soft, non-tender, non-distended MUSCULOSKELETAL:  No edema; No deformity  SKIN: Warm and dry NEUROLOGIC:  Alert and oriented x 3 PSYCHIATRIC:  Normal affect   Signed, Garwin Brothersajan R Dexton Zwilling, MD  01/26/2019 5:08 PM    [facility]

## 2019-01-27 LAB — BASIC METABOLIC PANEL
BUN/Creatinine Ratio: 18 (ref 10–24)
BUN: 22 mg/dL (ref 8–27)
CO2: 23 mmol/L (ref 20–29)
Calcium: 9.9 mg/dL (ref 8.6–10.2)
Chloride: 101 mmol/L (ref 96–106)
Creatinine, Ser: 1.19 mg/dL (ref 0.76–1.27)
GFR calc Af Amer: 71 mL/min/{1.73_m2} (ref >59)
GFR calc non Af Amer: 62 mL/min/{1.73_m2} (ref >59)
Glucose: 284 mg/dL — ABNORMAL HIGH (ref 65–99)
Potassium: 4.3 mmol/L (ref 3.5–5.2)
Sodium: 139 mmol/L (ref 134–144)

## 2019-02-04 ENCOUNTER — Telehealth: Payer: Self-pay

## 2019-02-04 NOTE — Telephone Encounter (Signed)
Left message that results were good, copy sent to Dr. Andree Elk.

## 2019-02-04 NOTE — Telephone Encounter (Signed)
-----   Message from Jenean Lindau, MD sent at 01/27/2019  8:10 AM EDT ----- The results of the study is unremarkable. Please inform patient. I will discuss in detail at next appointment. Cc  primary care/referring physician Jenean Lindau, MD 01/27/2019 8:09 AM

## 2019-02-12 ENCOUNTER — Other Ambulatory Visit: Payer: Self-pay

## 2019-02-12 ENCOUNTER — Telehealth: Payer: Self-pay

## 2019-02-12 MED ORDER — CLOPIDOGREL BISULFATE 75 MG PO TABS: 75.0000 mg | ORAL_TABLET | Freq: Every day | ORAL | 1 refills | Status: DC

## 2019-02-12 MED ORDER — HYDROCHLOROTHIAZIDE 25 MG PO TABS: 25.0000 mg | ORAL_TABLET | Freq: Every day | ORAL | Status: DC

## 2019-02-12 NOTE — Telephone Encounter (Signed)
Your question is incomplete.  It is up to the pt as to who manages this. I will be glad to.

## 2019-02-12 NOTE — Telephone Encounter (Signed)
Information relayed to [facility], refill for plavix sent to pharmacy.

## 2019-02-12 NOTE — Telephone Encounter (Signed)
He can take the stuff as prescribed by his doctor.  He will need to keep a track of pulse blood pressure and Chem-7 in a week.  What I was trying to clarify to you was that 1 of [place] will have to manage it either him or me.  Please have the patient decide.  Thank you

## 2019-02-12 NOTE — Telephone Encounter (Signed)
Patient was prescribed  lisinopril-hctz 10-12.5 mg on 01/26/19 and Dr. Tery Sanfilippo prescribed lisinopril 40 mg and HCTZ 25 mg.  Home health [facility]) called prior to filling pt pill bottles for clarification whether Dr. Docia Furl will be managing pt hypertension or Dr. Tery Sanfilippo? Patient was also put on

## 2019-02-27 ENCOUNTER — Other Ambulatory Visit: Payer: Self-pay

## 2019-02-27 ENCOUNTER — Encounter: Payer: Self-pay | Admitting: Cardiology

## 2019-02-27 ENCOUNTER — Ambulatory Visit (INDEPENDENT_AMBULATORY_CARE_PROVIDER_SITE_OTHER): Payer: Medicare Other | Admitting: Cardiology

## 2019-02-27 VITALS — BP 130/90 | HR 95 | Ht 68.0 in | Wt 202.0 lb

## 2019-02-27 DIAGNOSIS — I255 Ischemic cardiomyopathy: Secondary | ICD-10-CM

## 2019-02-27 DIAGNOSIS — I472 Ventricular tachycardia: Secondary | ICD-10-CM | POA: Diagnosis not present

## 2019-02-27 DIAGNOSIS — E119 Type 2 diabetes mellitus without complications: Secondary | ICD-10-CM

## 2019-02-27 DIAGNOSIS — Z1329 Encounter for screening for other suspected endocrine disorder: Secondary | ICD-10-CM

## 2019-02-27 DIAGNOSIS — I10 Essential (primary) hypertension: Secondary | ICD-10-CM | POA: Diagnosis not present

## 2019-02-27 DIAGNOSIS — I2510 Atherosclerotic heart disease of native coronary artery without angina pectoris: Secondary | ICD-10-CM | POA: Diagnosis not present

## 2019-02-27 DIAGNOSIS — Z951 Presence of aortocoronary bypass graft: Secondary | ICD-10-CM

## 2019-02-27 DIAGNOSIS — I4729 Other ventricular tachycardia: Secondary | ICD-10-CM

## 2019-02-27 NOTE — Progress Notes (Signed)
Cardiology Office Note:    Date:  02/27/2019   ID:  Jerry Myers, DOB 1950-12-17, MRN [id number]  PCP:  Jerry Spray, NP  Cardiologist:  Garwin Brothers, MD   Referring MD: Jerry Spray, NP    ASSESSMENT:    1. Coronary artery disease involving native coronary artery of native heart without angina pectoris   2. Ischemic cardiomyopathy   3. Essential hypertension   4. NSVT (nonsustained ventricular tachycardia) ([facility])   5. S/P CABG x 4   6. Type 2 diabetes mellitus without complication, without long-term current use of insulin ([facility])    PLAN:    In order of problems listed above:  1. Coronary artery disease: Secondary prevention stressed with the patient.  Importance of compliance with diet and medication stressed and he vocalized understanding. 2. Essential hypertension: Blood pressure stable 3. Mixed dyslipidemia: Diet was discussed.  Importance of weight reduction was stressed extensively.  He vocalized understanding he promises to do better. 4. Patient will be seen in follow-up appointment in 6 months or earlier if the patient has any concerns    Medication Adjustments/Labs and Tests Ordered: Current medicines are reviewed at length with the patient today.  Concerns regarding medicines are outlined above.  No orders of the defined types were placed in this encounter.  No orders of the defined types were placed in this encounter.    Chief Complaint  Patient presents with   Follow-up     History of Present Illness:    Jerry Myers is a 68 y.o. male.  Patient has past medical history of coronary artery disease post CABG surgery.  He denies any problems at this time and takes care of activities of daily living.  No chest pain orthopnea or PND.  At the time of my evaluation, the patient is alert awake oriented and in no distress.  He walks half an hour on a regular basis.  Past Medical History:  Diagnosis Date   Anxiety    Arthritis    Cardiomyopathy  ([facility]) 04/19/2015   Ejection fraction 4045% in the fall of 2016   Cataract    right eye   Coronary artery disease    Coronary artery disease of native artery of native heart with stable angina pectoris ([facility]) 03/18/2015   Depression    Dyspnea    Essential hypertension 12/29/2014   GERD (gastroesophageal reflux disease)    History of kidney stones    20 yrs. 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    Past Surgical History:  Procedure Laterality Date   CARDIAC CATHETERIZATION     CORONARY ARTERY BYPASS GRAFT N/A 09/05/2018   Procedure: CORONARY ARTERY BYPASS GRAFTING (CABG) x4, ON PUMP, USING LEFT INTERNAL MAMMARY ARTERY AND RIGHT AND LEFT GREAT SAPHENOUS VEIN HARVESTED ENDOSCOPICALLY;  Surgeon: Kerin Perna, MD;  Location: [facility];  Service: Open Heart Surgery;  Laterality: N/A;   FOOT SURGERY     LEFT HEART CATH AND CORONARY ANGIOGRAPHY N/A 06/05/2018   Procedure: LEFT HEART CATH AND CORONARY ANGIOGRAPHY;  Surgeon: Yvonne Kendall, MD;  Location: [facility] LAB;  Service: Cardiovascular;  Laterality: N/A;   LUNG SURGERY     TEE WITHOUT CARDIOVERSION N/A 09/05/2018   Procedure: TRANSESOPHAGEAL ECHOCARDIOGRAM (TEE);  Surgeon: Donata Clay, Theron Arista, MD;  Location: [facility] OR;  Service: Open Heart Surgery;  Laterality: N/A;    Current Medications: Current Meds  Medication Sig   aspirin EC 81  MG tablet Take 81 mg by mouth daily.   atorvastatin (LIPITOR) 20 MG tablet Take 20 mg by mouth daily.   carvedilol (COREG) 3.125 MG tablet Take 1 tablet (3.125 mg total) by mouth 2 (two) times daily with a meal.   clopidogrel (PLAVIX) 75 MG tablet Take 1 tablet (75 mg total) by mouth daily.   escitalopram (LEXAPRO) 5 MG tablet Take 5 mg by mouth daily.   gabapentin (NEURONTIN) 300 MG capsule Take 300 mg by mouth 2 (two) times daily.   glimepiride (AMARYL) 1 MG tablet Take 1 mg by mouth daily with breakfast.    glipiZIDE (GLUCOTROL XL) 5 MG 24 hr tablet Take 5 mg by mouth daily with breakfast.   hydrochlorothiazide (HYDRODIURIL) 25 MG tablet Take 1 tablet (25 mg total) by mouth daily.   lisinopril (ZESTRIL) 40 MG tablet Take 40 mg by mouth daily.   metFORMIN (GLUCOPHAGE) 500 MG tablet Take 1 tablet (500 mg total) by mouth 2 (two) times daily.   potassium chloride SA (K-DUR) 20 MEQ tablet Take 2 tablets (40 mEq total) by mouth 2 (two) times daily.   sitaGLIPtin (JANUVIA) 100 MG tablet Take 100 mg by mouth daily.     Allergies:   Patient has no known allergies.   Social History   Socioeconomic History   Marital status: Widowed    Spouse name: Not on file   Number of children: Not on file   Years of education: Not on file   Highest education level: Not on file  Occupational History   Not on file  Social Needs   Financial resource strain: Not on file   Food insecurity    Worry: Not on file    Inability: Not on file   Transportation needs    Medical: Not on file    Non-medical: Not on file  Tobacco Use   Smoking status: Never Smoker   Smokeless tobacco: Never Used  Substance and Sexual Activity   Alcohol use: Never    Frequency: Never   Drug use: Not Currently   Sexual activity: Not on file  Lifestyle   Physical activity    Days per week: Not on file    Minutes per session: Not on file   Stress: Not on file  Relationships   Social connections    Talks on phone: Not on file    Gets together: Not on file    Attends religious service: Not on file    Active member of club or organization: Not on file    Attends meetings of clubs or organizations: Not on file    Relationship status: Not on file  Other Topics Concern   Not on file  Social History Narrative   Not on file     Family History: The patient's family history includes Arrhythmia in his brother and mother; Congestive Heart Failure in his brother; Diabetes in his father, mother, sister, and sister;  Heart attack in his brother; Hypertension in his brother and father; Kidney disease in his brother; Other in his mother.  ROS:   Please see the history of present illness.    All other systems reviewed and are negative.  EKGs/Labs/Other Studies Reviewed:    The following studies were reviewed today: POST-OP IMPRESSIONS - Left Ventricle: There was peristent moderate LV dysfunction present on the post-bypass study. The inferior wall was hypokinetic and the ejection was estimated at 40-45%. - Aorta: The aorta appears unchanged from pre-bypass. - Left Atrial Appendage: The left atrial  appendage appears unchanged from pre-bypass. - Aortic Valve: The aortic valve appears unchanged from pre-bypass. - Mitral Valve: The mitral valve appears unchanged from pre-bypass. - Tricuspid Valve: The tricuspid valve appears unchanged from pre-bypass.  PRE-OP FINDINGS  Left Ventricle: The left ventricle has mild-moderately reduced systolic function, with an ejection fraction of 40-45%. The cavity size was moderately dilated. There is no increase in left ventricular wall thickness. The TEE windows were not optimal for  LV imaging. The LV cavity appeared enlarged and measured 58 mm at end-diastole at the mid-papillery level in the transgastric short axis view.. There was normal wall thickness. There was hypokinesis of the inferior wall. The ejection fraction was  estimated at 40-45% by visual estimate.    LEFT HEART CATH AND CORONARY ANGIOGRAPHY  Conclusion  Conclusions: 1. Significant three-vessel coronary artery disease, including diffuse LAD disease up to 70% (DFR of distal LAD is 0.71; cut-point for hemodynamic significance is 0.89), calcified 90% mid LCx stenosis as well as chronic occlusions of several OM branches, and chronic total occlusion of mid RCA with bridging and left-to-right collaterals. 2. Normal left ventricular filling pressure.  Recommendations: 1. Outpatient cardiac surgery  consultation.  Though chest pain is atypical, he may not experience typical angina in the setting of diabetes mellitus.  He would benefit from CABG based on his coronary anatomy on history of diabetes mellitus. 2. Aggressive secondary prevention.  Yvonne Kendallhristopher End, MD [facility] Pager: [phone]       Recent Labs: 09/09/2018: ALT 21; Magnesium 2.2 09/12/2018: Hemoglobin 9.8; Platelets 294 01/26/2019: BUN 22; Creatinine, Ser 1.19; Potassium 4.3; Sodium 139  Recent Lipid Panel    Component Value Date/Time   CHOL 123 06/20/2018 1215   TRIG 124 06/20/2018 1215   HDL 42 06/20/2018 1215   CHOLHDL 2.9 06/20/2018 1215   LDLCALC 60 06/20/2018 1215    Physical Exam:    VS:  BP 130/90 (BP Location: Left Arm, Patient Position: Sitting, Cuff Size: Normal)    Pulse 95    Ht 5\' 8"  (1.727 m)    Wt 202 lb (91.6 kg)    SpO2 (!) 63%    BMI 30.71 kg/m     Wt Readings from Last 3 Encounters:  02/27/19 202 lb (91.6 kg)  01/26/19 212 lb 9.6 oz (96.4 kg)  10/06/18 204 lb (92.5 kg)     GEN: Patient is in no acute distress HEENT: Normal NECK: No JVD; No carotid bruits LYMPHATICS: No lymphadenopathy CARDIAC: Hear sounds regular, 2/6 systolic murmur at the apex. RESPIRATORY:  Clear to auscultation without rales, wheezing or rhonchi  ABDOMEN: Soft, non-tender, non-distended MUSCULOSKELETAL:  No edema; No deformity  SKIN: Warm and dry NEUROLOGIC:  Alert and oriented x 3 PSYCHIATRIC:  Normal affect   Signed, Garwin Brothersajan R Van Ehlert, MD  02/27/2019 4:17 PM     [facility]

## 2019-02-27 NOTE — Patient Instructions (Signed)
Medication Instructions:  Your physician recommends that you continue on your current medications as directed. Please refer to the Current Medication list given to you today.  *If you need a refill on your cardiac medications before your next appointment, please call your pharmacy*  Lab Work: Your physician recommends that you return FASTING to have a BMP, CBC, TSh, hepatic and lipid drawn  If you have labs (blood work) drawn today and your tests are completely normal, you will receive your results only by: Marland Kitchen MyChart Message (if you have MyChart) OR . A paper copy in the mail If you have any lab test that is abnormal or we need to change your treatment, we will call you to review the results.  Testing/Procedures: NONE  Follow-Up: At [facility], you and your health needs are our priority.  As part of our continuing mission to provide you with exceptional heart care, we have created designated Provider Care Teams.  These Care Teams include your primary Cardiologist (physician) and Advanced Practice Providers (APPs -  Physician Assistants and Nurse Practitioners) who all work together to provide you with the care you need, when you need it.  Your next appointment:   6 months  The format for your next appointment:   In Person  Provider:   Jyl Heinz, MD

## 2019-06-13 IMAGING — CR CHEST - 2 VIEW
2 series · 2 of 2 positions shown · non-contrast
Comparison: 06/04/2018

CLINICAL DATA: Preop CABG

EXAM:
CHEST - 2 VIEW

[w chest pa]
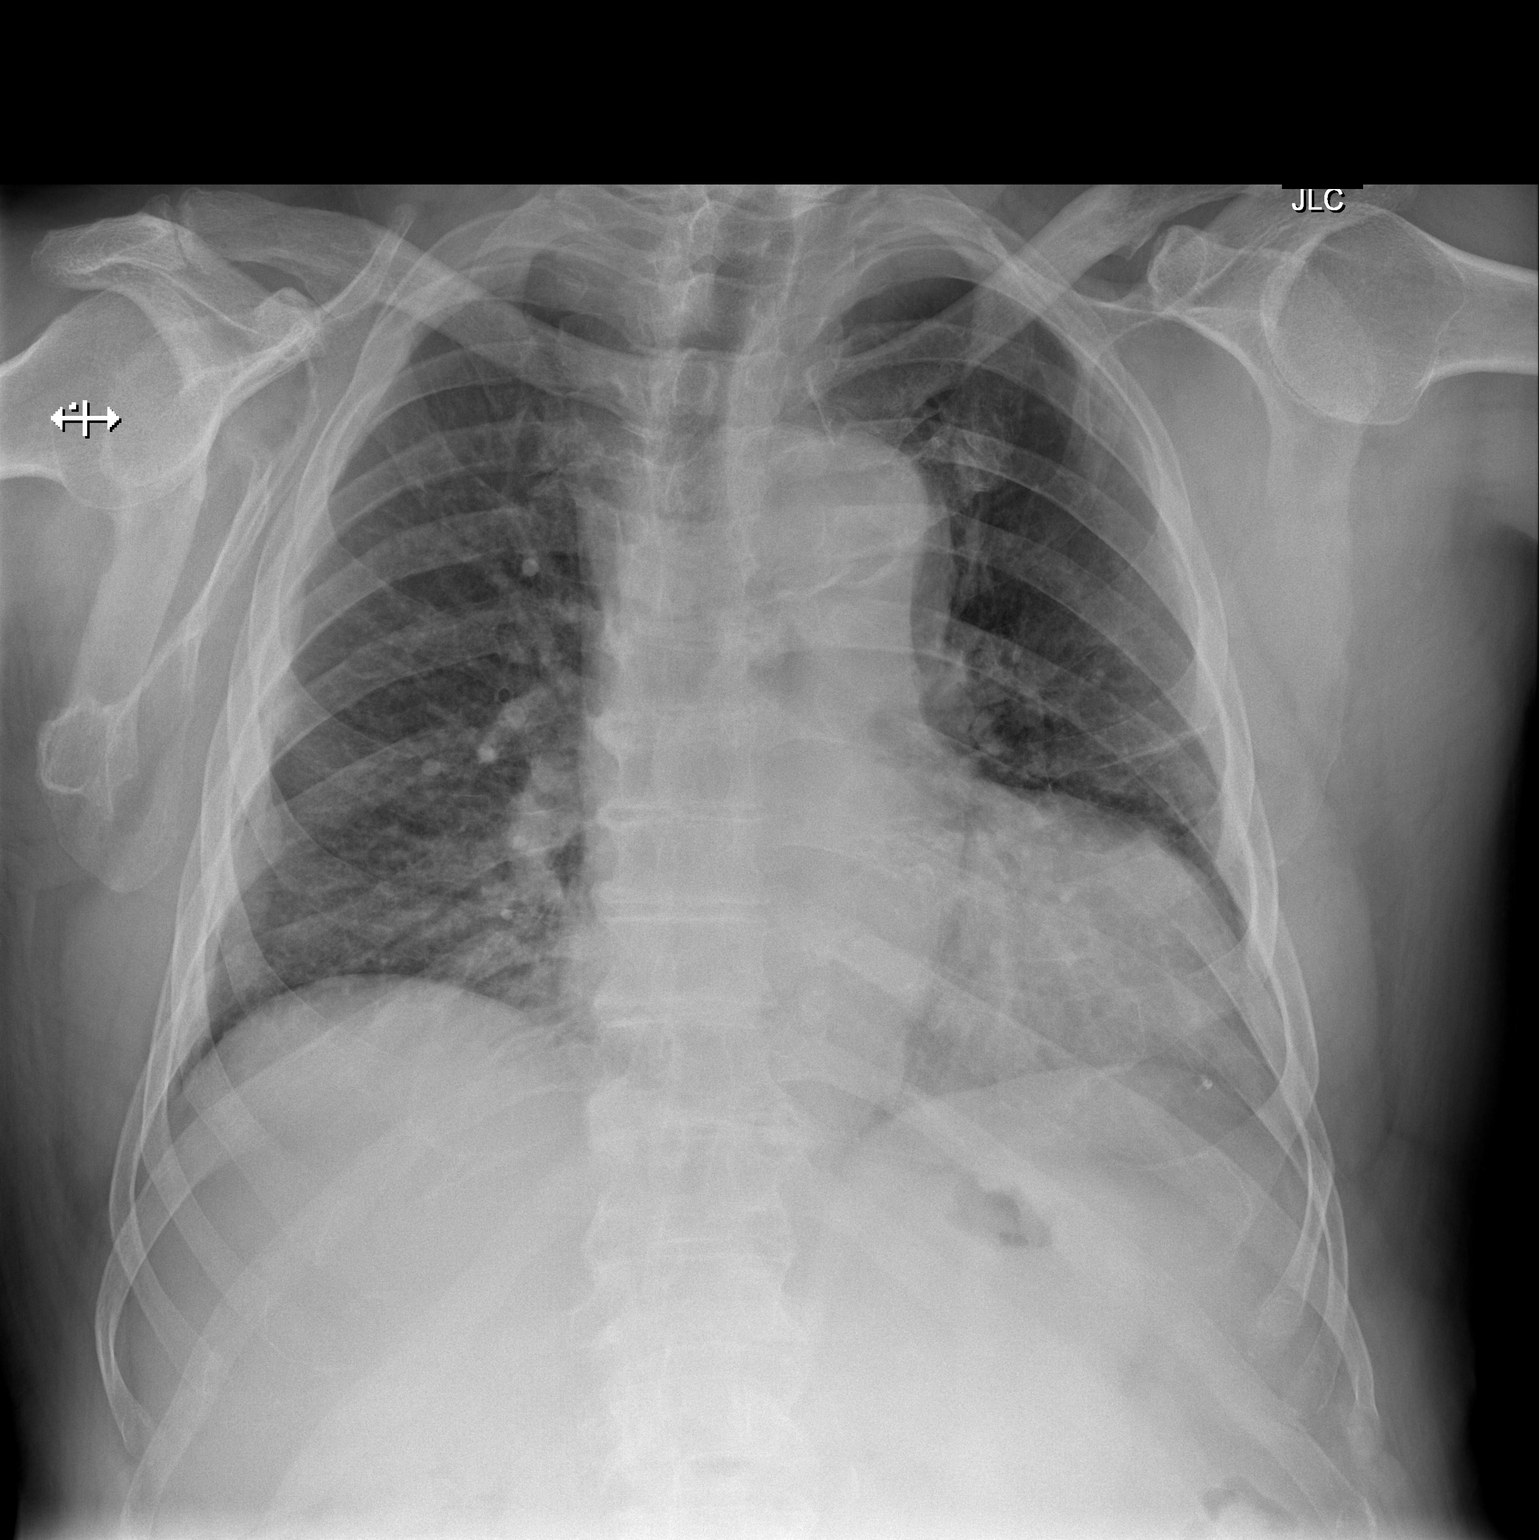

[w chest lat]
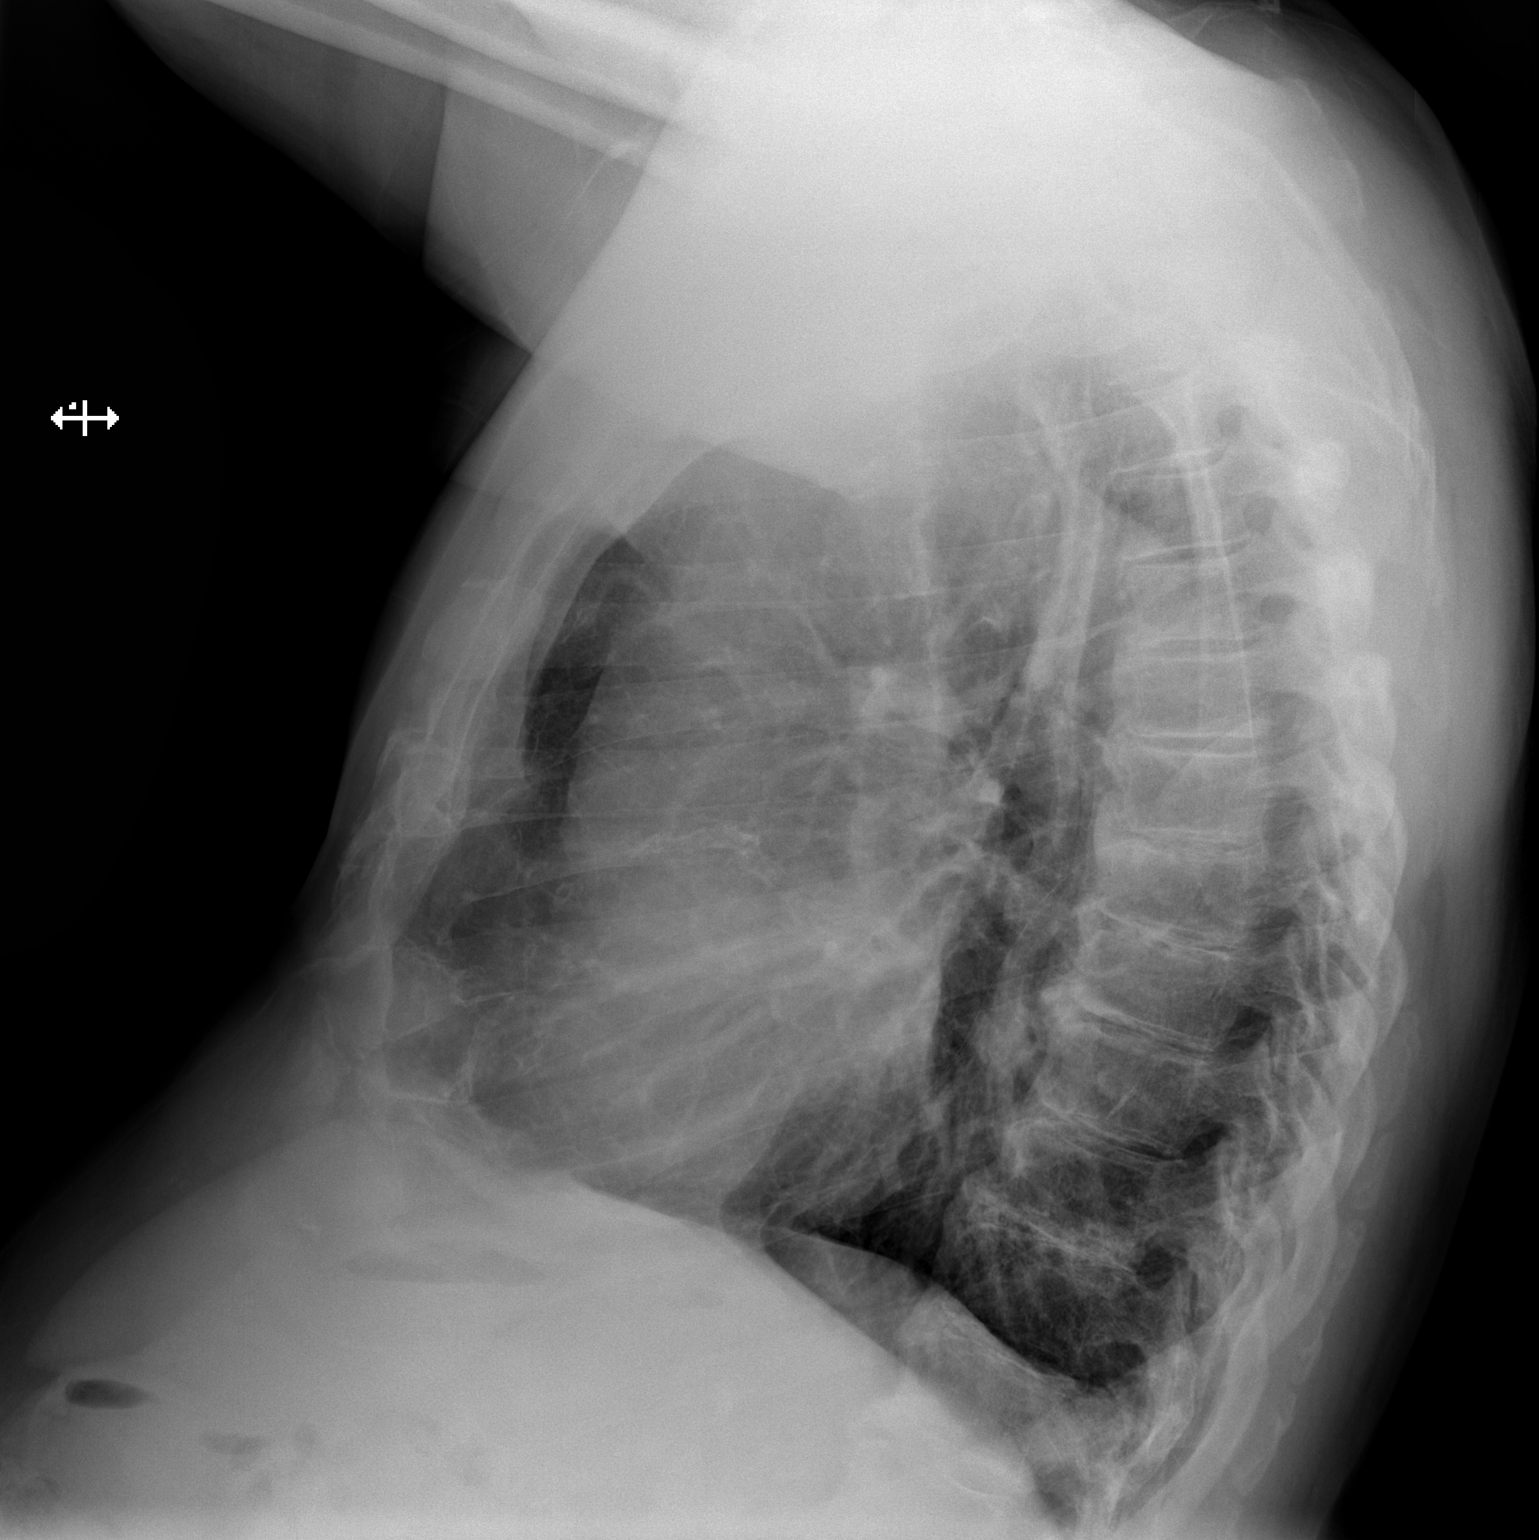

[2 of 2 positions shown; findings below may reference images not displayed]

FINDINGS: Cardiomegaly. No overt edema. No confluent airspace opacities or
effusions. No acute bony abnormality.
IMPRESSION: Cardiomegaly.  No active disease.

## 2019-07-22 ENCOUNTER — Other Ambulatory Visit: Payer: Self-pay | Admitting: Cardiology

## 2019-09-01 ENCOUNTER — Other Ambulatory Visit: Payer: Self-pay | Admitting: *Deleted

## 2019-09-01 DIAGNOSIS — I7101 Dissection of thoracic aorta: Secondary | ICD-10-CM

## 2019-09-01 DIAGNOSIS — I71019 Dissection of thoracic aorta, unspecified: Secondary | ICD-10-CM

## 2019-09-10 IMAGING — CR CHEST - 2 VIEW
2 series · 2 of 2 positions shown · non-contrast
Comparison: 09/10/2018

CLINICAL DATA: History of CABG.

EXAM:
CHEST - 2 VIEW

[w chest pa]
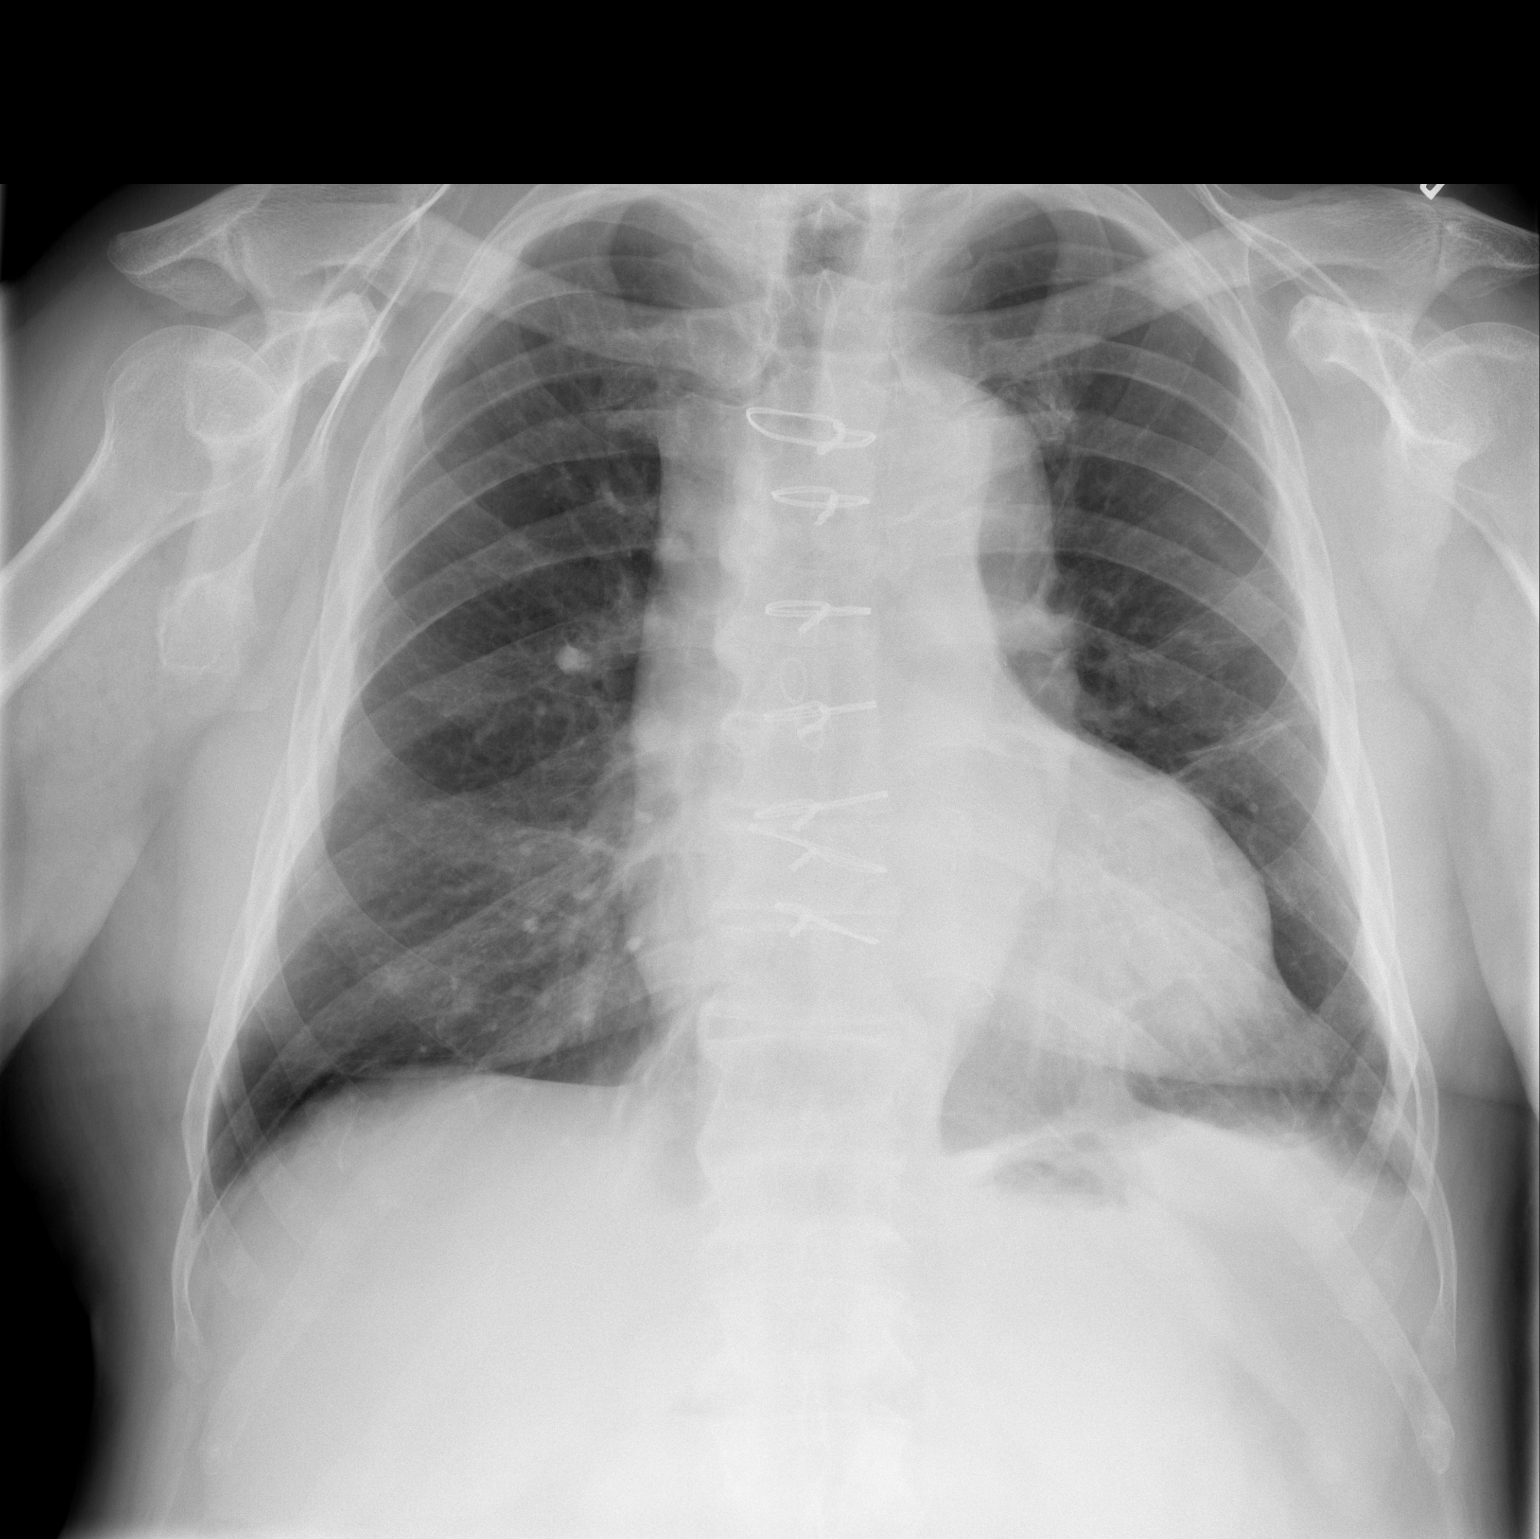

[w chest lat]
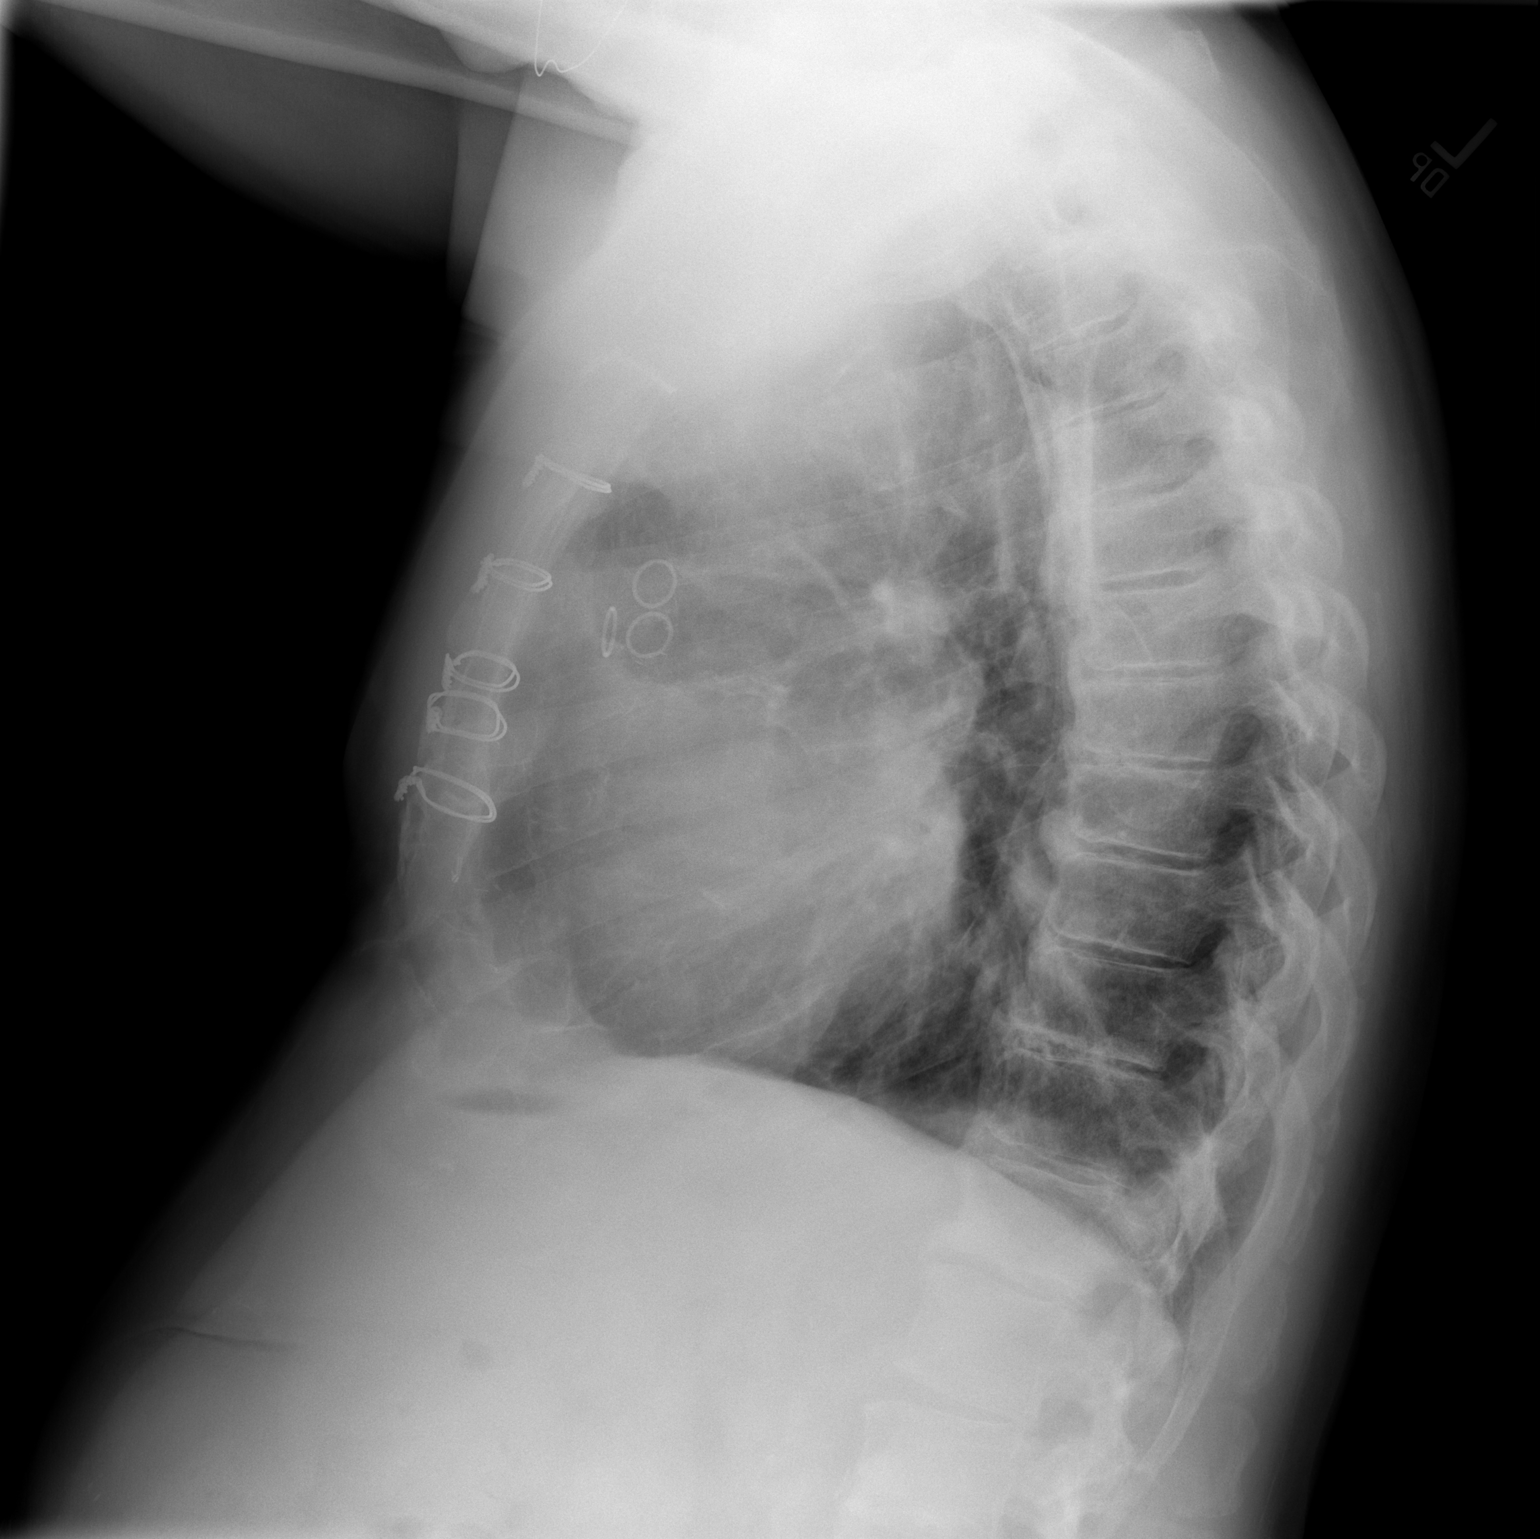

[2 of 2 positions shown; findings below may reference images not displayed]

FINDINGS: The heart size is mildly enlarged. The patient is status post prior
median sternotomy. There is some blunting of the left costophrenic
angle which appears stable from prior study. There are few scattered
linear airspace opacities bilaterally there is a small rounded
density at the right lower lobe, likely stable across prior studies.
IMPRESSION: No active cardiopulmonary disease.

## 2019-09-30 ENCOUNTER — Inpatient Hospital Stay: Admission: RE | Admit: 2019-09-30 | Payer: Medicare Other | Source: Ambulatory Visit

## 2019-09-30 ENCOUNTER — Encounter: Payer: Self-pay | Admitting: Cardiothoracic Surgery

## 2019-10-14 ENCOUNTER — Encounter: Payer: Self-pay | Admitting: Cardiothoracic Surgery

## 2019-10-15 ENCOUNTER — Encounter: Payer: Self-pay | Admitting: Cardiothoracic Surgery

## 2019-12-30 DIAGNOSIS — Z87442 Personal history of urinary calculi: Secondary | ICD-10-CM | POA: Insufficient documentation

## 2019-12-30 DIAGNOSIS — I2510 Atherosclerotic heart disease of native coronary artery without angina pectoris: Secondary | ICD-10-CM | POA: Insufficient documentation

## 2019-12-30 DIAGNOSIS — M1990 Unspecified osteoarthritis, unspecified site: Secondary | ICD-10-CM | POA: Insufficient documentation

## 2019-12-30 DIAGNOSIS — K219 Gastro-esophageal reflux disease without esophagitis: Secondary | ICD-10-CM | POA: Insufficient documentation

## 2019-12-30 DIAGNOSIS — H269 Unspecified cataract: Secondary | ICD-10-CM | POA: Insufficient documentation

## 2019-12-30 DIAGNOSIS — F32A Depression, unspecified: Secondary | ICD-10-CM | POA: Insufficient documentation

## 2019-12-30 DIAGNOSIS — R0600 Dyspnea, unspecified: Secondary | ICD-10-CM | POA: Insufficient documentation

## 2019-12-30 DIAGNOSIS — F419 Anxiety disorder, unspecified: Secondary | ICD-10-CM | POA: Insufficient documentation

## 2019-12-31 ENCOUNTER — Ambulatory Visit (INDEPENDENT_AMBULATORY_CARE_PROVIDER_SITE_OTHER): Payer: Medicare Other | Admitting: Cardiology

## 2019-12-31 ENCOUNTER — Other Ambulatory Visit: Payer: Self-pay

## 2019-12-31 ENCOUNTER — Encounter: Payer: Self-pay | Admitting: Cardiology

## 2019-12-31 VITALS — BP 134/64 | HR 54 | Ht 68.0 in | Wt 208.8 lb

## 2019-12-31 DIAGNOSIS — E119 Type 2 diabetes mellitus without complications: Secondary | ICD-10-CM

## 2019-12-31 DIAGNOSIS — I4729 Other ventricular tachycardia: Secondary | ICD-10-CM

## 2019-12-31 DIAGNOSIS — I10 Essential (primary) hypertension: Secondary | ICD-10-CM | POA: Diagnosis not present

## 2019-12-31 DIAGNOSIS — I255 Ischemic cardiomyopathy: Secondary | ICD-10-CM

## 2019-12-31 DIAGNOSIS — Z951 Presence of aortocoronary bypass graft: Secondary | ICD-10-CM

## 2019-12-31 DIAGNOSIS — I472 Ventricular tachycardia: Secondary | ICD-10-CM

## 2019-12-31 DIAGNOSIS — I2510 Atherosclerotic heart disease of native coronary artery without angina pectoris: Secondary | ICD-10-CM

## 2019-12-31 DIAGNOSIS — I25118 Atherosclerotic heart disease of native coronary artery with other forms of angina pectoris: Secondary | ICD-10-CM | POA: Diagnosis not present

## 2019-12-31 NOTE — Progress Notes (Signed)
Cardiology Office Note:    Date:  12/31/2019   ID:  [name], DOB 07-Jan-1951, MRN [id number]  PCP:  Mikael Spray, NP  Cardiologist:  Garwin Brothers, MD   Referring MD: Mikael Spray, NP    ASSESSMENT:    1. Coronary artery disease involving native coronary artery of native heart without angina pectoris   2. Ischemic cardiomyopathy   3. Coronary artery disease of native artery of native heart with stable angina pectoris (HCC)   4. Essential hypertension   5. NSVT (nonsustained ventricular tachycardia) (HCC)   6. S/P CABG x 4   7. Type 2 diabetes mellitus without complication, without long-term current use of insulin (HCC)    PLAN:    In order of problems listed above:  1. Coronary artery disease: Secondary prevention stressed with the patient. Importance of compliance with diet medication stressed any vocalized understanding. Importance of regular exercise stressed and he promises to walk at least half an hour a day 5 days a week. 2. Essential hypertension: Blood pressure is stable 3. Diabetes mellitus: Hemoglobin A1c from the past was elevated. We will check this today and send to primary care physician for review. 4. Mixed dyslipidemia. Patient is keen on getting blood work today and will review lipids. Diet was emphasized. 5. Ischemic cardiomyopathy: Echocardiogram will be done to assess this.Patient will be seen in follow-up appointment in 6 months or earlier if the patient has any concerns. At that time IV evaluated the patient needs other evaluations and treatments for cardiomyopathy such as medications like Entresto. I discussed this with him at length and he vocalized understanding.   Medication Adjustments/Labs and Tests Ordered: Current medicines are reviewed at length with the patient today.  Concerns regarding medicines are outlined above.  No orders of the defined types were placed in this encounter.  No orders of the defined types were placed in this  encounter.    No chief complaint on file.    History of Present Illness:    Jerry Myers is a 69 y.o. male. Patient has past medical history of coronary artery disease, ischemic cardiomyopathy and underwent bypass surgery. He has history of essential hypertension diabetes mellitus dyslipidemia. He leads a sedentary lifestyle. No chest pain orthopnea or PND. Its been a long time since he had blood work. He is fasting and has not had anything to eat since midnight. At the time of my evaluation, the patient is alert awake oriented and in no distress.  Past Medical History:  Diagnosis Date  . Abnormal nuclear cardiac imaging test 06/03/2018  . Anxiety   . Arthritis   . Atrial flutter by electrocardiogram (HCC) 09/09/2018  . Bradycardia 03/18/2018  . CAD (coronary artery disease) 01/26/2019  . Cardiomyopathy (HCC) 04/19/2015   Ejection fraction 4045% in the fall of 2016  . Cataract    right eye  . Chest pain 06/05/2018  . Coronary artery disease   . Coronary artery disease of native artery of native heart with stable angina pectoris (HCC) 03/18/2015  . Depression   . Dyspnea   . Essential hypertension 12/29/2014  . GERD (gastroesophageal reflux disease)   . History of kidney stones    20 yrs. ago  . NSVT (nonsustained ventricular tachycardia) (HCC) 06/03/2018  . 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  . Type 2 diabetes mellitus without complication (HCC) 12/29/2014  . 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 CARDIAC CATHETERIZATION    .  CORONARY ARTERY BYPASS GRAFT N/A 09/05/2018   Procedure: CORONARY ARTERY BYPASS GRAFTING (CABG) x4, ON PUMP, USING LEFT INTERNAL MAMMARY ARTERY AND RIGHT AND LEFT GREAT SAPHENOUS VEIN HARVESTED ENDOSCOPICALLY;  Surgeon: Kerin Perna, MD;  Location: [facility] OR;  Service: Open Heart Surgery;  Laterality: N/A;  . FOOT SURGERY    . LEFT HEART CATH AND CORONARY ANGIOGRAPHY N/A 06/05/2018   Procedure: LEFT HEART CATH AND  CORONARY ANGIOGRAPHY;  Surgeon: Yvonne Kendall, MD;  Location: [facility] INVAS[facility] LAB;  Service: Cardiovascular;  Laterality: N/A;  . LUNG SURGERY    . TEE WITHOUT CARDIOVERSION N/A 09/05/2018   Procedure: TRANSESOPHAGEAL ECHOCARDIOGRAM (TEE);  Surgeon: Donata Clay, Theron Arista, MD;  Location: [facility] OR;  Service: Open Heart Surgery;  Laterality: N/A;    Current Medications: Current Meds  Medication Sig  . aspirin EC 81 MG tablet Take 81 mg by mouth daily.  Marland Kitchen atorvastatin (LIPITOR) 20 MG tablet Take 20 mg by mouth daily.  . carvedilol (COREG) 3.125 MG tablet Take 1 tablet (3.125 mg total) by mouth 2 (two) times daily with a meal.  . clopidogrel (PLAVIX) 75 MG tablet TAKE 1 TABLET BY MOUTH ONCE DAILY  . escitalopram (LEXAPRO) 5 MG tablet Take 5 mg by mouth daily.  Marland Kitchen gabapentin (NEURONTIN) 300 MG capsule Take 300 mg by mouth 2 (two) times daily.  Marland Kitchen glimepiride (AMARYL) 2 MG tablet Take 2 mg by mouth daily with breakfast.   . glipiZIDE (GLUCOTROL XL) 5 MG 24 hr tablet Take 5 mg by mouth daily with breakfast.  . hydrochlorothiazide (HYDRODIURIL) 25 MG tablet Take 1 tablet (25 mg total) by mouth daily.  Marland Kitchen lisinopril (ZESTRIL) 40 MG tablet Take 40 mg by mouth daily.  . meloxicam (MOBIC) 7.5 MG tablet Take 7.5 mg by mouth daily.  . metFORMIN (GLUCOPHAGE) 1000 MG tablet Take 1,000 mg by mouth 2 (two) times daily.  Marland Kitchen nystatin cream (MYCOSTATIN) Apply topically daily as needed.  . sitaGLIPtin (JANUVIA) 100 MG tablet Take 100 mg by mouth daily.     Allergies:   Trulicity [dulaglutide]   Social History   Socioeconomic History  . Marital status: Widowed    Spouse name: Not on file  . Number of children: Not on file  . Years of education: Not on file  . Highest education level: Not on file  Occupational History  . Not on file  Tobacco Use  . Smoking status: Never Smoker  . Smokeless tobacco: Never Used  Vaping Use  . Vaping Use: Never used  Substance and Sexual Activity  . Alcohol use: Never  . Drug  use: Not Currently  . Sexual activity: Not on file  Other Topics Concern  . Not on file  Social History Narrative  . Not on file   Social Determinants of Health   Financial Resource Strain:   . Difficulty of Paying Living Expenses: Not on file  Food Insecurity:   . Worried About Programme researcher, broadcasting/film/video in the Last Year: Not on file  . Ran Out of Food in the Last Year: Not on file  Transportation Needs:   . Lack of Transportation (Medical): Not on file  . Lack of Transportation (Non-Medical): Not on file  Physical Activity:   . Days of Exercise per Week: Not on file  . Minutes of Exercise per Session: Not on file  Stress:   . Feeling of Stress : Not on file  Social Connections:   . Frequency of Communication with Friends and Family: Not on  file  . Frequency of Social Gatherings with Friends and Family: Not on file  . Attends Religious Services: Not on file  . Active Member of Clubs or Organizations: Not on file  . Attends Banker Meetings: Not on file  . Marital Status: Not on file     Family History: The patient's family history includes Arrhythmia in his brother and mother; Congestive Heart Failure in his brother; Diabetes in his father, mother, sister, and sister; Heart attack in his brother; Hypertension in his brother and father; Kidney disease in his brother; Other in his mother.  ROS:   Please see the history of present illness.    All other systems reviewed and are negative.  EKGs/Labs/Other Studies Reviewed:    The following studies were reviewed today: I discussed my findings with the patient at length.   Recent Labs: 01/26/2019: BUN 22; Creatinine, Ser 1.19; Potassium 4.3; Sodium 139  Recent Lipid Panel    Component Value Date/Time   CHOL 123 06/20/2018 1215   TRIG 124 06/20/2018 1215   HDL 42 06/20/2018 1215   CHOLHDL 2.9 06/20/2018 1215   LDLCALC 60 06/20/2018 1215    Physical Exam:    VS:  BP 134/64   Pulse (!) 54   Ht 5\' 8"  (1.727 m)    Wt 208 lb 12.8 oz (94.7 kg)   SpO2 90%   BMI 31.75 kg/m     Wt Readings from Last 3 Encounters:  12/31/19 208 lb 12.8 oz (94.7 kg)  02/27/19 202 lb (91.6 kg)  01/26/19 212 lb 9.6 oz (96.4 kg)     GEN: Patient is in no acute distress HEENT: Normal NECK: No JVD; No carotid bruits LYMPHATICS: No lymphadenopathy CARDIAC: Hear sounds regular, 2/6 systolic murmur at the apex. RESPIRATORY:  Clear to auscultation without rales, wheezing or rhonchi  ABDOMEN: Soft, non-tender, non-distended MUSCULOSKELETAL:  No edema; No deformity  SKIN: Warm and dry NEUROLOGIC:  Alert and oriented x 3 PSYCHIATRIC:  Normal affect   Signed, 03/28/19, MD  12/31/2019 10:39 AM    [facility]

## 2019-12-31 NOTE — Patient Instructions (Signed)
Medication Instructions:  Your physician recommends that you continue on your current medications as directed. Please refer to the Current Medication list given to you today.  *If you need a refill on your cardiac medications before your next appointment, please call your pharmacy*   Lab Work: Bmp,Cbc,Tsh,Lft,Lipid,Hgb A1c- Today   If you have labs (blood work) drawn today and your tests are completely normal, you will receive your results only by: Marland Kitchen MyChart Message (if you have MyChart) OR . A paper copy in the mail If you have any lab test that is abnormal or we need to change your treatment, we will call you to review the results.   Testing/Procedures: Your physician has requested that you have an echocardiogram. Echocardiography is a painless test that uses sound waves to create images of your heart. It provides your doctor with information about the size and shape of your heart and how well your heart's chambers and valves are working. This procedure takes approximately one hour. There are no restrictions for this procedure.    Follow-Up: At [facility], you and your health needs are our priority.  As part of our continuing mission to provide you with exceptional heart care, we have created designated Provider Care Teams.  These Care Teams include your primary Cardiologist (physician) and Advanced Practice Providers (APPs -  Physician Assistants and Nurse Practitioners) who all work together to provide you with the care you need, when you need it.  We recommend signing up for the patient portal called "MyChart".  Sign up information is provided on this After Visit Summary.  MyChart is used to connect with patients for Virtual Visits (Telemedicine).  Patients are able to view lab/test results, encounter notes, upcoming appointments, etc.  Non-urgent messages can be sent to your provider as well.   To learn more about what you can do with MyChart, go to [email or web address].     Your next appointment:   6 week(s)  The format for your next appointment:   In Person  Provider:   Belva Crome, MD   Other Instructions None

## 2020-01-01 ENCOUNTER — Telehealth: Payer: Self-pay

## 2020-01-01 LAB — HEPATIC FUNCTION PANEL
ALT: 27 IU/L (ref 0–44)
AST: 11 IU/L (ref 0–40)
Albumin: 4.1 g/dL (ref 3.7–4.7)
Alkaline Phosphatase: 94 IU/L (ref 48–121)
Bilirubin Total: 0.3 mg/dL (ref 0.0–1.2)
Bilirubin, Direct: 0.09 mg/dL (ref 0.00–0.40)
Total Protein: 6.6 g/dL (ref 6.0–8.5)

## 2020-01-01 LAB — HEMOGLOBIN A1C
Est. average glucose Bld gHb Est-mCnc: 338 mg/dL
Hgb A1c MFr Bld: 13.4 % — ABNORMAL HIGH (ref 4.8–5.6)

## 2020-01-01 LAB — BASIC METABOLIC PANEL
BUN/Creatinine Ratio: 16 (ref 10–24)
BUN: 21 mg/dL (ref 8–27)
CO2: 22 mmol/L (ref 20–29)
Calcium: 10 mg/dL (ref 8.6–10.2)
Chloride: 98 mmol/L (ref 96–106)
Creatinine, Ser: 1.32 mg/dL — ABNORMAL HIGH (ref 0.76–1.27)
GFR calc Af Amer: 62 mL/min/{1.73_m2} (ref >59)
GFR calc non Af Amer: 54 mL/min/{1.73_m2} — ABNORMAL LOW (ref >59)
Glucose: 350 mg/dL — ABNORMAL HIGH (ref 65–99)
Potassium: 4.8 mmol/L (ref 3.5–5.2)
Sodium: 136 mmol/L (ref 134–144)

## 2020-01-01 LAB — CBC
Hematocrit: 40.9 % (ref 37.5–51.0)
Hemoglobin: 14.1 g/dL (ref 13.0–17.7)
MCH: 30.6 pg (ref 26.6–33.0)
MCHC: 34.5 g/dL (ref 31.5–35.7)
MCV: 89 fL (ref 79–97)
Platelets: 257 10*3/uL (ref 150–450)
RBC: 4.61 x10E6/uL (ref 4.14–5.80)
RDW: 12.9 % (ref 11.6–15.4)
WBC: 5.5 10*3/uL (ref 3.4–10.8)

## 2020-01-01 LAB — TSH: TSH: 2.24 u[IU]/mL (ref 0.450–4.500)

## 2020-01-01 LAB — LIPID PANEL
Chol/HDL Ratio: 3.6 ratio (ref 0.0–5.0)
Cholesterol, Total: 142 mg/dL (ref 100–199)
HDL: 39 mg/dL — ABNORMAL LOW (ref >39)
LDL Chol Calc (NIH): 73 mg/dL (ref 0–99)
Triglycerides: 180 mg/dL — ABNORMAL HIGH (ref 0–149)
VLDL Cholesterol Cal: 30 mg/dL (ref 5–40)

## 2020-01-01 NOTE — Telephone Encounter (Signed)
Left message on patients voicemail to please return our call.   

## 2020-01-01 NOTE — Telephone Encounter (Signed)
Jerry Myers is calling returning Jerry Myers's call.

## 2020-01-01 NOTE — Telephone Encounter (Signed)
-----   Message from Garwin Brothers, MD sent at 01/01/2020 11:57 AM EDT ----- His lab work is markedly abnormal.  His sugars and hemoglobin A1c are markedly elevated.  Please call his primary care physician's office and let them know and send them a copy.  Please tell the patient to get in touch with primary care to discuss these issues.  Lipids are fine. Garwin Brothers, MD 01/01/2020 11:57 AM

## 2020-01-04 ENCOUNTER — Other Ambulatory Visit: Payer: Medicare Other

## 2020-01-04 NOTE — Telephone Encounter (Signed)
Patient returned call, can reached him on his cell number listed above.

## 2020-01-04 NOTE — Telephone Encounter (Signed)
Spoke with patient regarding results and recommendation.  Patient verbalizes understanding and is agreeable to plan of care. Advised patient to call back with any issues or concerns.  

## 2020-01-04 NOTE — Telephone Encounter (Signed)
Left message on patients voicemail to please return our call.   

## 2020-01-22 ENCOUNTER — Other Ambulatory Visit: Payer: Medicare Other

## 2020-02-18 ENCOUNTER — Ambulatory Visit (INDEPENDENT_AMBULATORY_CARE_PROVIDER_SITE_OTHER): Payer: Medicare Other | Admitting: Cardiology

## 2020-02-18 ENCOUNTER — Other Ambulatory Visit: Payer: Self-pay

## 2020-02-18 ENCOUNTER — Encounter: Payer: Self-pay | Admitting: Cardiology

## 2020-02-18 VITALS — BP 143/78 | HR 66 | Ht 68.0 in | Wt 212.6 lb

## 2020-02-18 DIAGNOSIS — Z951 Presence of aortocoronary bypass graft: Secondary | ICD-10-CM

## 2020-02-18 DIAGNOSIS — I472 Ventricular tachycardia: Secondary | ICD-10-CM | POA: Diagnosis not present

## 2020-02-18 DIAGNOSIS — I10 Essential (primary) hypertension: Secondary | ICD-10-CM

## 2020-02-18 DIAGNOSIS — I25118 Atherosclerotic heart disease of native coronary artery with other forms of angina pectoris: Secondary | ICD-10-CM

## 2020-02-18 DIAGNOSIS — E119 Type 2 diabetes mellitus without complications: Secondary | ICD-10-CM

## 2020-02-18 DIAGNOSIS — Z23 Encounter for immunization: Secondary | ICD-10-CM | POA: Diagnosis not present

## 2020-02-18 DIAGNOSIS — I255 Ischemic cardiomyopathy: Secondary | ICD-10-CM | POA: Diagnosis not present

## 2020-02-18 DIAGNOSIS — I4729 Other ventricular tachycardia: Secondary | ICD-10-CM

## 2020-02-18 NOTE — Progress Notes (Signed)
Cardiology Office Note:    Date:  02/18/2020   ID:  Jerry Myers, DOB 04-25-1950, MRN [id number]  PCP:  Adela Glimpse, NP  Cardiologist:  Garwin Brothers, MD   Referring MD: Mikael Spray, NP    ASSESSMENT:    1. Ischemic cardiomyopathy   2. Coronary artery disease of native artery of native heart with stable angina pectoris (HCC)   3. NSVT (nonsustained ventricular tachycardia) (HCC)   4. Essential hypertension   5. S/P CABG x 4   6. Type 2 diabetes mellitus without complication, without long-term current use of insulin (HCC)    PLAN:    In order of problems listed above:  1. Coronary artery disease: Secondary prevention stressed with the patient.  Importance of compliance with diet medication stressed and he vocalized understanding. 2. Essential hypertension: Blood pressure stable and diet was emphasized. 3. Diabetes mellitus: Moderately elevated hemoglobin A1c.  I emphasized to him the critical importance of following with his primary care physician for diet and medications for diabetes control.  I told him that this is detrimental to his general health and a very significant with and he promises to do better. 4. Mixed dyslipidemia: On statin therapy.  Diet was emphasized.  His lipids are followed by primary care physician. 5. Patient will be seen in follow-up appointment in 3 months or earlier if the patient has any concerns I will see him in follow-up appointment and at that point consider echocardiogram.  I do not think he is very compliant with medical therapy so there is no point in trying to add more medications to him especially drugs like Entresto.  He promises to comply and do better.  Lifestyle modification and exercise and medication compliance.    Medication Adjustments/Labs and Tests Ordered: Current medicines are reviewed at length with the patient today.  Concerns regarding medicines are outlined above.  No orders of the defined types were placed in this  encounter.  No orders of the defined types were placed in this encounter.    No chief complaint on file.    History of Present Illness:    Jerry Myers is a 69 y.o. male with past medical history of coronary artery disease post CABG surgery, essential hypertension dyslipidemia and diabetes mellitus.  Patient denies any problems at this time and takes care of activities of daily living.  He leads a sedentary lifestyle.  His hemoglobin A1c was greater than 13 and we send his records to primary care physician.  They are managing his diabetes mellitus.  Patient mentions to me that he has not been compliant with diet or exercise.  He strained to get better.  He denies any chest pain.  Past Medical History:  Diagnosis Date   Abnormal nuclear cardiac imaging test 06/03/2018   Anxiety    Arthritis    Atrial flutter by electrocardiogram (HCC) 09/09/2018   Bradycardia 03/18/2018   CAD (coronary artery disease) 01/26/2019   Cardiomyopathy (HCC) 04/19/2015   Ejection fraction 4045% in the fall of 2016   Cataract    right eye   Chest pain 06/05/2018   Coronary artery disease    Coronary artery disease of native artery of native heart with stable angina pectoris (HCC) 03/18/2015   Depression    Dyspnea    Essential hypertension 12/29/2014   GERD (gastroesophageal reflux disease)    History of kidney stones    20 yrs. ago   NSVT (nonsustained ventricular tachycardia) (HCC) 06/03/2018   S/P CABG  x 4 09/05/2018   Type 2 diabetes mellitus without complication (HCC) 12/29/2014   Ventricular extrasystoles 12/29/2014    Past Surgical History:  Procedure Laterality Date   CARDIAC CATHETERIZATION     CORONARY ARTERY BYPASS GRAFT N/A 09/05/2018   Procedure: CORONARY ARTERY BYPASS GRAFTING (CABG) x4, ON PUMP, USING LEFT INTERNAL MAMMARY ARTERY AND RIGHT AND LEFT GREAT SAPHENOUS VEIN HARVESTED ENDOSCOPICALLY;  Surgeon: Kerin Perna, MD;  Location: [facility] OR;  Service: Open Heart  Surgery;  Laterality: N/A;   FOOT SURGERY     LEFT HEART CATH AND CORONARY ANGIOGRAPHY N/A 06/05/2018   Procedure: LEFT HEART CATH AND CORONARY ANGIOGRAPHY;  Surgeon: Yvonne Kendall, MD;  Location: [facility] INVASIVE CV LAB;  Service: Cardiovascular;  Laterality: N/A;   LUNG SURGERY     TEE WITHOUT CARDIOVERSION N/A 09/05/2018   Procedure: TRANSESOPHAGEAL ECHOCARDIOGRAM (TEE);  Surgeon: Donata Clay, Theron Arista, MD;  Location: [facility] OR;  Service: Open Heart Surgery;  Laterality: N/A;    Current Medications: Current Meds  Medication Sig   aspirin EC 81 MG tablet Take 81 mg by mouth daily.   atorvastatin (LIPITOR) 20 MG tablet Take 20 mg by mouth daily.   carvedilol (COREG) 3.125 MG tablet Take 1 tablet (3.125 mg total) by mouth 2 (two) times daily with a meal.   clopidogrel (PLAVIX) 75 MG tablet TAKE 1 TABLET BY MOUTH ONCE DAILY   escitalopram (LEXAPRO) 5 MG tablet Take 5 mg by mouth daily.   gabapentin (NEURONTIN) 300 MG capsule Take 300 mg by mouth 2 (two) times daily.   glimepiride (AMARYL) 2 MG tablet Take 2 mg by mouth daily with breakfast.    glipiZIDE (GLUCOTROL XL) 5 MG 24 hr tablet Take 5 mg by mouth daily with breakfast.   hydrochlorothiazide (HYDRODIURIL) 25 MG tablet Take 1 tablet (25 mg total) by mouth daily.   lisinopril (ZESTRIL) 40 MG tablet Take 40 mg by mouth daily.   meloxicam (MOBIC) 7.5 MG tablet Take 7.5 mg by mouth daily.   metFORMIN (GLUCOPHAGE) 1000 MG tablet Take 1,000 mg by mouth 2 (two) times daily.   nystatin cream (MYCOSTATIN) Apply topically daily as needed.   sitaGLIPtin (JANUVIA) 100 MG tablet Take 100 mg by mouth daily.     Allergies:   Trulicity [dulaglutide]   Social History   Socioeconomic History   Marital status: Widowed    Spouse name: Not on file   Number of children: Not on file   Years of education: Not on file   Highest education level: Not on file  Occupational History   Not on file  Tobacco Use   Smoking status: Never Smoker     Smokeless tobacco: Never Used  Vaping Use   Vaping Use: Never used  Substance and Sexual Activity   Alcohol use: Never   Drug use: Not Currently   Sexual activity: Not on file  Other Topics Concern   Not on file  Social History Narrative   Not on file   Social Determinants of Health   Financial Resource Strain:    Difficulty of Paying Living Expenses: Not on file  Food Insecurity:    Worried About Running Out of Food in the Last Year: Not on file   Ran Out of Food in the Last Year: Not on file  Transportation Needs:    Lack of Transportation (Medical): Not on file   Lack of Transportation (Non-Medical): Not on file  Physical Activity:    Days of Exercise per Week: Not on file  Minutes of Exercise per Session: Not on file  Stress:    Feeling of Stress : Not on file  Social Connections:    Frequency of Communication with Friends and Family: Not on file   Frequency of Social Gatherings with Friends and Family: Not on file   Attends Religious Services: Not on file   Active Member of Clubs or Organizations: Not on file   Attends Banker Meetings: Not on file   Marital Status: Not on file     Family History: The patient's family history includes Arrhythmia in his brother and mother; Congestive Heart Failure in his brother; Diabetes in his father, mother, sister, and sister; Heart attack in his brother; Hypertension in his brother and father; Kidney disease in his brother; Other in his mother.  ROS:   Please see the history of present illness.    All other systems reviewed and are negative.  EKGs/Labs/Other Studies Reviewed:    The following studies were reviewed today: I discussed my findings with the patient at length including lab work   Recent Labs: 12/31/2019: ALT 27; BUN 21; Creatinine, Ser 1.32; Hemoglobin 14.1; Platelets 257; Potassium 4.8; Sodium 136; TSH 2.240  Recent Lipid Panel    Component Value Date/Time   CHOL 142  12/31/2019 1105   TRIG 180 (H) 12/31/2019 1105   HDL 39 (L) 12/31/2019 1105   CHOLHDL 3.6 12/31/2019 1105   CHOLHDL 2.9 06/20/2018 1215   LDLCALC 73 12/31/2019 1105   LDLCALC 60 06/20/2018 1215    Physical Exam:    VS:  BP (!) 143/78    Pulse 66    Ht 5\' 8"  (1.727 m)    Wt 212 lb 9.6 oz (96.4 kg)    SpO2 96%    BMI 32.33 kg/m     Wt Readings from Last 3 Encounters:  02/18/20 212 lb 9.6 oz (96.4 kg)  12/31/19 208 lb 12.8 oz (94.7 kg)  02/27/19 202 lb (91.6 kg)     GEN: Patient is in no acute distress HEENT: Normal NECK: No JVD; No carotid bruits LYMPHATICS: No lymphadenopathy CARDIAC: Hear sounds regular, 2/6 systolic murmur at the apex. RESPIRATORY:  Clear to auscultation without rales, wheezing or rhonchi  ABDOMEN: Soft, non-tender, non-distended MUSCULOSKELETAL:  No edema; No deformity  SKIN: Warm and dry NEUROLOGIC:  Alert and oriented x 3 PSYCHIATRIC:  Normal affect   Signed, 13/06/20, MD  02/18/2020 3:40 PM    [facility]

## 2020-02-18 NOTE — Patient Instructions (Signed)

## 2020-05-24 ENCOUNTER — Ambulatory Visit: Payer: Medicare Other | Admitting: Cardiology

## 2020-11-22 ENCOUNTER — Other Ambulatory Visit: Payer: Self-pay | Admitting: Internal Medicine

## 2020-11-22 NOTE — Telephone Encounter (Signed)
Refill request

## 2022-01-29 ENCOUNTER — Other Ambulatory Visit: Payer: Self-pay

## 2022-01-29 MED ORDER — NITROGLYCERIN 0.4 MG SL SUBL: 0.4000 mg | SUBLINGUAL_TABLET | SUBLINGUAL | 0 refills | Status: AC | PRN

## 2022-04-30 ENCOUNTER — Other Ambulatory Visit: Payer: Self-pay

## 2022-05-03 DIAGNOSIS — I119 Hypertensive heart disease without heart failure: Secondary | ICD-10-CM

## 2022-05-03 DIAGNOSIS — E119 Type 2 diabetes mellitus without complications: Secondary | ICD-10-CM

## 2022-05-03 DIAGNOSIS — I447 Left bundle-branch block, unspecified: Secondary | ICD-10-CM | POA: Diagnosis not present

## 2022-05-03 DIAGNOSIS — I340 Nonrheumatic mitral (valve) insufficiency: Secondary | ICD-10-CM | POA: Diagnosis not present

## 2022-05-03 DIAGNOSIS — I2510 Atherosclerotic heart disease of native coronary artery without angina pectoris: Secondary | ICD-10-CM | POA: Diagnosis not present

## 2022-05-03 DIAGNOSIS — I5023 Acute on chronic systolic (congestive) heart failure: Secondary | ICD-10-CM | POA: Diagnosis not present

## 2022-05-03 DIAGNOSIS — E785 Hyperlipidemia, unspecified: Secondary | ICD-10-CM

## 2022-05-04 ENCOUNTER — Ambulatory Visit: Payer: 59 | Admitting: Cardiology

## 2022-05-04 DIAGNOSIS — I340 Nonrheumatic mitral (valve) insufficiency: Secondary | ICD-10-CM | POA: Diagnosis not present

## 2022-05-04 DIAGNOSIS — I5023 Acute on chronic systolic (congestive) heart failure: Secondary | ICD-10-CM | POA: Diagnosis not present

## 2022-05-04 DIAGNOSIS — I447 Left bundle-branch block, unspecified: Secondary | ICD-10-CM | POA: Diagnosis not present

## 2022-05-04 DIAGNOSIS — I4892 Unspecified atrial flutter: Secondary | ICD-10-CM | POA: Diagnosis not present

## 2022-05-04 DIAGNOSIS — I2510 Atherosclerotic heart disease of native coronary artery without angina pectoris: Secondary | ICD-10-CM | POA: Diagnosis not present

## 2022-05-05 ENCOUNTER — Emergency Department (HOSPITAL_COMMUNITY): Payer: 59

## 2022-05-05 ENCOUNTER — Inpatient Hospital Stay (HOSPITAL_COMMUNITY)
Admission: EM | Admit: 2022-05-05 | Discharge: 2022-05-15 | DRG: 871 | Disposition: A | Discharge status: Skilled Nursing Facility | Payer: 59 | Attending: Internal Medicine | Admitting: Internal Medicine

## 2022-05-05 ENCOUNTER — Encounter (HOSPITAL_COMMUNITY): Payer: Self-pay

## 2022-05-05 ENCOUNTER — Other Ambulatory Visit: Payer: Self-pay

## 2022-05-05 ENCOUNTER — Other Ambulatory Visit (HOSPITAL_COMMUNITY): Payer: 59

## 2022-05-05 DIAGNOSIS — E872 Acidosis, unspecified: Secondary | ICD-10-CM | POA: Diagnosis not present

## 2022-05-05 DIAGNOSIS — E1122 Type 2 diabetes mellitus with diabetic chronic kidney disease: Secondary | ICD-10-CM | POA: Diagnosis present

## 2022-05-05 DIAGNOSIS — I509 Heart failure, unspecified: Secondary | ICD-10-CM | POA: Diagnosis not present

## 2022-05-05 DIAGNOSIS — I480 Paroxysmal atrial fibrillation: Secondary | ICD-10-CM | POA: Diagnosis present

## 2022-05-05 DIAGNOSIS — J9621 Acute and chronic respiratory failure with hypoxia: Secondary | ICD-10-CM | POA: Diagnosis present

## 2022-05-05 DIAGNOSIS — Z841 Family history of disorders of kidney and ureter: Secondary | ICD-10-CM

## 2022-05-05 DIAGNOSIS — Z833 Family history of diabetes mellitus: Secondary | ICD-10-CM

## 2022-05-05 DIAGNOSIS — I5023 Acute on chronic systolic (congestive) heart failure: Secondary | ICD-10-CM | POA: Diagnosis not present

## 2022-05-05 DIAGNOSIS — E1140 Type 2 diabetes mellitus with diabetic neuropathy, unspecified: Secondary | ICD-10-CM | POA: Diagnosis present

## 2022-05-05 DIAGNOSIS — Z79899 Other long term (current) drug therapy: Secondary | ICD-10-CM

## 2022-05-05 DIAGNOSIS — I484 Atypical atrial flutter: Secondary | ICD-10-CM | POA: Diagnosis present

## 2022-05-05 DIAGNOSIS — E785 Hyperlipidemia, unspecified: Secondary | ICD-10-CM | POA: Diagnosis present

## 2022-05-05 DIAGNOSIS — Z7984 Long term (current) use of oral hypoglycemic drugs: Secondary | ICD-10-CM

## 2022-05-05 DIAGNOSIS — I4891 Unspecified atrial fibrillation: Secondary | ICD-10-CM | POA: Diagnosis not present

## 2022-05-05 DIAGNOSIS — Z8249 Family history of ischemic heart disease and other diseases of the circulatory system: Secondary | ICD-10-CM

## 2022-05-05 DIAGNOSIS — I083 Combined rheumatic disorders of mitral, aortic and tricuspid valves: Secondary | ICD-10-CM | POA: Diagnosis present

## 2022-05-05 DIAGNOSIS — J1000 Influenza due to other identified influenza virus with unspecified type of pneumonia: Secondary | ICD-10-CM | POA: Diagnosis present

## 2022-05-05 DIAGNOSIS — I483 Typical atrial flutter: Secondary | ICD-10-CM | POA: Diagnosis not present

## 2022-05-05 DIAGNOSIS — E1165 Type 2 diabetes mellitus with hyperglycemia: Secondary | ICD-10-CM | POA: Diagnosis present

## 2022-05-05 DIAGNOSIS — J1100 Influenza due to unidentified influenza virus with unspecified type of pneumonia: Secondary | ICD-10-CM

## 2022-05-05 DIAGNOSIS — I255 Ischemic cardiomyopathy: Secondary | ICD-10-CM | POA: Diagnosis present

## 2022-05-05 DIAGNOSIS — A4189 Other specified sepsis: Principal | ICD-10-CM | POA: Diagnosis present

## 2022-05-05 DIAGNOSIS — F32A Depression, unspecified: Secondary | ICD-10-CM | POA: Diagnosis present

## 2022-05-05 DIAGNOSIS — E871 Hypo-osmolality and hyponatremia: Secondary | ICD-10-CM | POA: Diagnosis not present

## 2022-05-05 DIAGNOSIS — Z951 Presence of aortocoronary bypass graft: Secondary | ICD-10-CM

## 2022-05-05 DIAGNOSIS — R570 Cardiogenic shock: Secondary | ICD-10-CM | POA: Diagnosis present

## 2022-05-05 DIAGNOSIS — I2510 Atherosclerotic heart disease of native coronary artery without angina pectoris: Secondary | ICD-10-CM | POA: Diagnosis present

## 2022-05-05 DIAGNOSIS — Z87442 Personal history of urinary calculi: Secondary | ICD-10-CM

## 2022-05-05 DIAGNOSIS — I5082 Biventricular heart failure: Secondary | ICD-10-CM | POA: Diagnosis present

## 2022-05-05 DIAGNOSIS — N179 Acute kidney failure, unspecified: Secondary | ICD-10-CM | POA: Diagnosis present

## 2022-05-05 DIAGNOSIS — I130 Hypertensive heart and chronic kidney disease with heart failure and stage 1 through stage 4 chronic kidney disease, or unspecified chronic kidney disease: Secondary | ICD-10-CM | POA: Diagnosis present

## 2022-05-05 DIAGNOSIS — Z888 Allergy status to other drugs, medicaments and biological substances status: Secondary | ICD-10-CM

## 2022-05-05 DIAGNOSIS — N1831 Chronic kidney disease, stage 3a: Secondary | ICD-10-CM | POA: Diagnosis present

## 2022-05-05 DIAGNOSIS — A419 Sepsis, unspecified organism: Secondary | ICD-10-CM

## 2022-05-05 DIAGNOSIS — I5021 Acute systolic (congestive) heart failure: Secondary | ICD-10-CM | POA: Diagnosis not present

## 2022-05-05 DIAGNOSIS — Z7982 Long term (current) use of aspirin: Secondary | ICD-10-CM

## 2022-05-05 DIAGNOSIS — Z1152 Encounter for screening for COVID-19: Secondary | ICD-10-CM | POA: Diagnosis not present

## 2022-05-05 DIAGNOSIS — J9811 Atelectasis: Secondary | ICD-10-CM | POA: Diagnosis present

## 2022-05-05 DIAGNOSIS — I25119 Atherosclerotic heart disease of native coronary artery with unspecified angina pectoris: Secondary | ICD-10-CM | POA: Diagnosis not present

## 2022-05-05 DIAGNOSIS — F419 Anxiety disorder, unspecified: Secondary | ICD-10-CM | POA: Diagnosis present

## 2022-05-05 DIAGNOSIS — K219 Gastro-esophageal reflux disease without esophagitis: Secondary | ICD-10-CM | POA: Diagnosis present

## 2022-05-05 DIAGNOSIS — I110 Hypertensive heart disease with heart failure: Secondary | ICD-10-CM | POA: Diagnosis not present

## 2022-05-05 DIAGNOSIS — Z791 Long term (current) use of non-steroidal anti-inflammatories (NSAID): Secondary | ICD-10-CM

## 2022-05-05 DIAGNOSIS — E876 Hypokalemia: Secondary | ICD-10-CM | POA: Diagnosis not present

## 2022-05-05 DIAGNOSIS — R6521 Severe sepsis with septic shock: Secondary | ICD-10-CM | POA: Diagnosis present

## 2022-05-05 DIAGNOSIS — D631 Anemia in chronic kidney disease: Secondary | ICD-10-CM | POA: Diagnosis present

## 2022-05-05 DIAGNOSIS — G9341 Metabolic encephalopathy: Secondary | ICD-10-CM | POA: Diagnosis present

## 2022-05-05 DIAGNOSIS — J9601 Acute respiratory failure with hypoxia: Secondary | ICD-10-CM | POA: Diagnosis not present

## 2022-05-05 DIAGNOSIS — I4892 Unspecified atrial flutter: Secondary | ICD-10-CM | POA: Diagnosis not present

## 2022-05-05 DIAGNOSIS — Z789 Other specified health status: Secondary | ICD-10-CM

## 2022-05-05 DIAGNOSIS — Z7902 Long term (current) use of antithrombotics/antiplatelets: Secondary | ICD-10-CM

## 2022-05-05 DIAGNOSIS — I340 Nonrheumatic mitral (valve) insufficiency: Secondary | ICD-10-CM | POA: Diagnosis not present

## 2022-05-05 DIAGNOSIS — M1990 Unspecified osteoarthritis, unspecified site: Secondary | ICD-10-CM | POA: Diagnosis present

## 2022-05-05 HISTORY — DX: Acute on chronic systolic (congestive) heart failure: I50.23

## 2022-05-05 LAB — CBC WITH DIFFERENTIAL/PLATELET
Abs Immature Granulocytes: 0.01 10*3/uL (ref 0.00–0.07)
Basophils Absolute: 0 10*3/uL (ref 0.0–0.1)
Basophils Relative: 0 %
Eosinophils Absolute: 0 10*3/uL (ref 0.0–0.5)
Eosinophils Relative: 0 %
HCT: 40.9 % (ref 39.0–52.0)
Hemoglobin: 13.2 g/dL (ref 13.0–17.0)
Immature Granulocytes: 0 %
Lymphocytes Relative: 11 %
Lymphs Abs: 0.6 10*3/uL — ABNORMAL LOW (ref 0.7–4.0)
MCH: 29.5 pg (ref 26.0–34.0)
MCHC: 32.3 g/dL (ref 30.0–36.0)
MCV: 91.3 fL (ref 80.0–100.0)
Monocytes Absolute: 1.1 10*3/uL — ABNORMAL HIGH (ref 0.1–1.0)
Monocytes Relative: 19 %
Neutro Abs: 3.9 10*3/uL (ref 1.7–7.7)
Neutrophils Relative %: 70 %
Platelets: 271 10*3/uL (ref 150–400)
RBC: 4.48 MIL/uL (ref 4.22–5.81)
RDW: 16.2 % — ABNORMAL HIGH (ref 11.5–15.5)
WBC: 5.6 10*3/uL (ref 4.0–10.5)
nRBC: 0 % (ref 0.0–0.2)

## 2022-05-05 LAB — I-STAT VENOUS BLOOD GAS, ED
Acid-Base Excess: 0 mmol/L (ref 0.0–2.0)
Bicarbonate: 22.3 mmol/L (ref 20.0–28.0)
Calcium, Ion: 0.99 mmol/L — ABNORMAL LOW (ref 1.15–1.40)
HCT: 40 % (ref 39.0–52.0)
Hemoglobin: 13.6 g/dL (ref 13.0–17.0)
O2 Saturation: 75 %
Potassium: 3.4 mmol/L — ABNORMAL LOW (ref 3.5–5.1)
Sodium: 141 mmol/L (ref 135–145)
TCO2: 23 mmol/L (ref 22–32)
pCO2, Ven: 28.1 mmHg — ABNORMAL LOW (ref 44–60)
pH, Ven: 7.507 — ABNORMAL HIGH (ref 7.25–7.43)
pO2, Ven: 35 mmHg (ref 32–45)

## 2022-05-05 LAB — COMPREHENSIVE METABOLIC PANEL
ALT: 19 U/L (ref 0–44)
AST: 25 U/L (ref 15–41)
Albumin: 3.5 g/dL (ref 3.5–5.0)
Alkaline Phosphatase: 70 U/L (ref 38–126)
Anion gap: 14 (ref 5–15)
BUN: 27 mg/dL — ABNORMAL HIGH (ref 8–23)
CO2: 24 mmol/L (ref 22–32)
Calcium: 8.9 mg/dL (ref 8.9–10.3)
Chloride: 102 mmol/L (ref 98–111)
Creatinine, Ser: 1.7 mg/dL — ABNORMAL HIGH (ref 0.61–1.24)
GFR, Estimated: 42 mL/min — ABNORMAL LOW (ref >60)
Glucose, Bld: 189 mg/dL — ABNORMAL HIGH (ref 70–99)
Potassium: 3.6 mmol/L (ref 3.5–5.1)
Sodium: 140 mmol/L (ref 135–145)
Total Bilirubin: 1 mg/dL (ref 0.3–1.2)
Total Protein: 6.4 g/dL — ABNORMAL LOW (ref 6.5–8.1)

## 2022-05-05 LAB — URINALYSIS, ROUTINE W REFLEX MICROSCOPIC
Bacteria, UA: NONE SEEN
Bilirubin Urine: NEGATIVE
Glucose, UA: >=500 mg/dL — AB
Ketones, ur: NEGATIVE mg/dL
Leukocytes,Ua: NEGATIVE
Nitrite: NEGATIVE
Protein, ur: NEGATIVE mg/dL
RBC / HPF: >50 RBC/hpf — ABNORMAL HIGH (ref 0–5)
Specific Gravity, Urine: 1.012 (ref 1.005–1.030)
pH: 5 (ref 5.0–8.0)

## 2022-05-05 LAB — CBG MONITORING, ED: Glucose-Capillary: 182 mg/dL — ABNORMAL HIGH (ref 70–99)

## 2022-05-05 LAB — TROPONIN I (HIGH SENSITIVITY)
Troponin I (High Sensitivity): 93 ng/L — ABNORMAL HIGH (ref <18)
Troponin I (High Sensitivity): 83 ng/L — ABNORMAL HIGH (ref <18)

## 2022-05-05 LAB — BRAIN NATRIURETIC PEPTIDE: B Natriuretic Peptide: 1029.1 pg/mL — ABNORMAL HIGH (ref 0.0–100.0)

## 2022-05-05 LAB — LACTIC ACID, PLASMA
Lactic Acid, Venous: 2.9 mmol/L (ref 0.5–1.9)
Lactic Acid, Venous: 2.5 mmol/L (ref 0.5–1.9)

## 2022-05-05 MED ORDER — CHLORHEXIDINE GLUCONATE CLOTH 2 % EX PADS
6.0000 | MEDICATED_PAD | Freq: Every day | CUTANEOUS | Status: DC
  Administered 2022-05-06 – 2022-05-10 (×5): 6 via TOPICAL

## 2022-05-05 MED ORDER — FUROSEMIDE 10 MG/ML IJ SOLN
40.0000 mg | Freq: Once | INTRAMUSCULAR | Status: AC
  Administered 2022-05-05: 40 mg via INTRAVENOUS
  Filled 2022-05-05: qty 4

## 2022-05-05 MED ORDER — VANCOMYCIN HCL 2000 MG/400ML IV SOLN
2000.0000 mg | Freq: Once | INTRAVENOUS | Status: AC
  Administered 2022-05-06: 2000 mg via INTRAVENOUS
  Filled 2022-05-05: qty 400

## 2022-05-05 MED ORDER — INSULIN ASPART 100 UNIT/ML IJ SOLN
0.0000 [IU] | INTRAMUSCULAR | Status: DC
  Administered 2022-05-06 (×4): 2 [IU] via SUBCUTANEOUS

## 2022-05-05 MED ORDER — SODIUM CHLORIDE 0.9 % IV SOLN
2.0000 g | Freq: Once | INTRAVENOUS | Status: AC
  Administered 2022-05-05: 2 g via INTRAVENOUS
  Filled 2022-05-05: qty 12.5

## 2022-05-05 MED ORDER — APIXABAN 5 MG PO TABS
5.0000 mg | ORAL_TABLET | Freq: Two times a day (BID) | ORAL | Status: DC
  Administered 2022-05-06 – 2022-05-15 (×21): 5 mg via ORAL
  Filled 2022-05-05 (×22): qty 1

## 2022-05-05 MED ORDER — NOREPINEPHRINE 4 MG/250ML-% IV SOLN
2.0000 ug/min | INTRAVENOUS | Status: DC
  Administered 2022-05-06 (×2): 2 ug/min via INTRAVENOUS
  Administered 2022-05-07: 3 ug/min via INTRAVENOUS
  Administered 2022-05-08: 1 ug/min via INTRAVENOUS
  Filled 2022-05-05 (×3): qty 250

## 2022-05-05 MED ORDER — ACETAMINOPHEN 650 MG RE SUPP
650.0000 mg | Freq: Once | RECTAL | Status: DC
  Filled 2022-05-05: qty 1

## 2022-05-05 MED ORDER — VANCOMYCIN HCL 1750 MG/350ML IV SOLN: 1750.0000 mg | INTRAVENOUS | Status: DC

## 2022-05-05 MED ORDER — SODIUM CHLORIDE 0.9 % IV SOLN: 250.0000 mL | INTRAVENOUS | Status: DC

## 2022-05-05 NOTE — ED Notes (Signed)
Patient complains of bladder pressure and needing to urinate. Bladder very distended. Patient attempted to void and could not. Over 1045ml of urine in bladder. Foley cath ordered by provider.

## 2022-05-05 NOTE — Progress Notes (Signed)
Pt placed on BiPAP by RT per MD. Pt tolerating well at this time, MD at  bedside, RT will monitor.

## 2022-05-05 NOTE — ED Notes (Addendum)
ED TO INPATIENT HANDOFF REPORT  ED Nurse Name and Phone #: [phone]  S Name/Age/Gender [name] 72 y.o. male Room/Bed: 034C/034C  Code Status   Code Status: Full Code  Home/SNF/Other Home Patient oriented to: self, place, time, and situation Is this baseline? Yes   Triage Complete: Triage complete  Chief Complaint Acute on chronic systolic (congestive) heart failure (HCC) [I50.23] Acute on chronic systolic CHF (congestive heart failure) (HCC) [I50.23]  Triage Note Pt arrived via [facility]. Pt was at [facility] for 3-4 days for CHF exacerbation and pt left AMA, because he was tired of sitting in the bed. Pt's family seen pt was weak and SOB and called EMS to have him sent here.   Allergies Allergies  Allergen Reactions   Trulicity [Dulaglutide] Nausea And Vomiting    Level of Care/Admitting Diagnosis ED Disposition     ED Disposition  Admit   Condition  --   Comment  Hospital Area: [facility] [[id number]]  Level of Care: ICU [6]  May admit patient to [facility] or [facility] if equivalent level of care is available:: No  Covid Evaluation: Confirmed COVID Negative  Diagnosis: Acute on chronic systolic CHF (congestive heart failure) [facility]) [[id number]]  Admitting Physician: Coralyn Helling [[id number]]  Attending Physician: Coralyn Helling [[id number]]  Certification:: I certify this patient will need inpatient services for at least 2 midnights          B Medical/Surgery History Past Medical History:  Diagnosis Date   Abnormal nuclear cardiac imaging test 06/03/2018   Anxiety    Arthritis    Atrial flutter by electrocardiogram (HCC) 09/09/2018   Bradycardia 03/18/2018   CAD (coronary artery disease) 01/26/2019   Cardiomyopathy (HCC) 04/19/2015   Ejection fraction 4045% in the fall of 2016   Cataract    right eye   Chest pain 06/05/2018   Coronary artery disease    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   Past Surgical History:  Procedure Laterality Date   CARDIAC CATHETERIZATION     CORONARY ARTERY BYPASS GRAFT N/A 09/05/2018   Procedure: CORONARY ARTERY BYPASS GRAFTING (CABG) x4, ON PUMP, USING LEFT INTERNAL MAMMARY ARTERY AND RIGHT AND LEFT GREAT SAPHENOUS VEIN HARVESTED ENDOSCOPICALLY;  Surgeon: Kerin Perna, MD;  Location: [facility] OR;  Service: Open Heart Surgery;  Laterality: N/A;   FOOT SURGERY     LEFT HEART CATH AND CORONARY ANGIOGRAPHY N/A 06/05/2018   Procedure: LEFT HEART CATH AND CORONARY ANGIOGRAPHY;  Surgeon: Yvonne Kendall, MD;  Location: [facility] INVASIVE CV LAB;  Service: Cardiovascular;  Laterality: N/A;   LUNG SURGERY     TEE WITHOUT CARDIOVERSION N/A 09/05/2018   Procedure: TRANSESOPHAGEAL ECHOCARDIOGRAM (TEE);  Surgeon: Donata Clay, Theron Arista, MD;  Location: [facility] OR;  Service: Open Heart Surgery;  Laterality: N/A;     A IV Location/Drains/Wounds Patient Lines/Drains/Airways Status     Active Line/Drains/Airways     Name Placement date Placement time Site Days   Peripheral IV 09/05/18 Left Hand 09/05/18  0745  Hand  1338   Peripheral IV 05/05/22 20 G  1.88" Anterior;Left Forearm 05/05/22  1840  Forearm  less than 1   Urethral Catheter [place] RN Non-latex 16 Fr. 05/05/22  1828  Non-latex  less than 1   Incision (Closed) 09/05/18 Chest Other (Comment) 09/05/18  0827  -- 1338   Incision (Closed) 09/05/18 Leg Right 09/05/18  1031  -- 1338   Incision (Closed) 09/05/18 Leg Left 09/05/18  1141  -- 1338            Intake/Output Last 24 hours  Intake/Output Summary (Last 24 hours) at 05/05/2022 2235 Last data filed at 05/05/2022  1830 Gross per 24 hour  Intake --  Output 1100 ml  Net -1100 ml    Labs/Imaging Results for orders placed or performed during the hospital encounter of 05/05/22 (from the past 48 hour(s))  Brain natriuretic peptide     Status: Abnormal   Collection Time: 05/05/22  4:35 PM  Result Value Ref Range   B Natriuretic Peptide 1,029.1 (H) 0.0 - 100.0 pg/mL    Comment: Performed at [facility], [place] [address], [place], [place] [address]  Troponin I (High Sensitivity)     Status: Abnormal   Collection Time: 05/05/22  4:35 PM  Result Value Ref Range   Troponin I (High Sensitivity) 93 (H) <18 ng/L    Comment: (NOTE) Elevated high sensitivity troponin I (hsTnI) values and significant  changes across serial measurements may suggest ACS but many other  chronic and acute conditions are known to elevate hsTnI results.  Refer to the "Links" section for chest pain algorithms and additional  guidance. Performed at [facility], [place] [address], [place], [place] [address]   CBC with Differential     Status: Abnormal   Collection Time: 05/05/22  4:35 PM  Result Value Ref Range   WBC 5.6 4.0 - 10.5 K/uL   RBC 4.48 4.22 - 5.81 MIL/uL   Hemoglobin 13.2 13.0 - 17.0 g/dL   HCT 40.9 39.0 - 52.0 %   MCV 91.3 80.0 - 100.0 fL   MCH 29.5 26.0 - 34.0 pg   MCHC 32.3 30.0 - 36.0 g/dL   RDW 16.2 (H) 11.5 - 15.5 %   Platelets 271 150 - 400 K/uL   nRBC 0.0 0.0 - 0.2 %   Neutrophils Relative % 70 %   Neutro Abs 3.9 1.7 - 7.7 K/uL   Lymphocytes Relative 11 %   Lymphs Abs 0.6 (L) 0.7 - 4.0 K/uL   Monocytes Relative 19 %   Monocytes Absolute 1.1 (H) 0.1 - 1.0 K/uL   Eosinophils Relative 0 %   Eosinophils Absolute 0.0 0.0 - 0.5 K/uL   Basophils Relative 0 %   Basophils Absolute 0.0 0.0 - 0.1 K/uL   Immature Granulocytes 0 %   Abs Immature Granulocytes 0.01 0.00 - 0.07 K/uL    Comment: Performed at [facility], [place] [address], [place], [place] [address]  Comprehensive metabolic panel      Status: Abnormal   Collection Time: 05/05/22  4:35 PM  Result Value Ref Range   Sodium 140 135 - 145 mmol/L   Potassium 3.6 3.5 - 5.1 mmol/L   Chloride 102 98 - 111 mmol/L   CO2 24 22 - 32 mmol/L   Glucose, Bld 189 (H) 70 - 99 mg/dL    Comment: Glucose reference range applies only to samples taken after fasting for at least 8 hours.   BUN 27 (H) 8 - 23 mg/dL   Creatinine, Ser 1.70 (  H) 0.61 - 1.24 mg/dL   Calcium 8.9 8.9 - 46.9 mg/dL   Total Protein 6.4 (L) 6.5 - 8.1 g/dL   Albumin 3.5 3.5 - 5.0 g/dL   AST 25 15 - 41 U/L   ALT 19 0 - 44 U/L   Alkaline Phosphatase 70 38 - 126 U/L   Total Bilirubin 1.0 0.3 - 1.2 mg/dL   GFR, Estimated 42 (L) >60 mL/min    Comment: (NOTE) Calculated using the CKD-EPI Creatinine Equation (2021)    Anion gap 14 5 - 15    Comment: Performed at [facility], [address] [place], [place] [address]  POC CBG, ED     Status: Abnormal   Collection Time: 05/05/22  4:40 PM  Result Value Ref Range   Glucose-Capillary 182 (H) 70 - 99 mg/dL    Comment: Glucose reference range applies only to samples taken after fasting for at least 8 hours.  Urinalysis, Routine w reflex microscopic Urine, Catheterized     Status: Abnormal   Collection Time: 05/05/22  4:42 PM  Result Value Ref Range   Color, Urine YELLOW YELLOW   APPearance CLEAR CLEAR   Specific Gravity, Urine 1.012 1.005 - 1.030   pH 5.0 5.0 - 8.0   Glucose, UA >=500 (A) NEGATIVE mg/dL   Hgb urine dipstick MODERATE (A) NEGATIVE   Bilirubin Urine NEGATIVE NEGATIVE   Ketones, ur NEGATIVE NEGATIVE mg/dL   Protein, ur NEGATIVE NEGATIVE mg/dL   Nitrite NEGATIVE NEGATIVE   Leukocytes,Ua NEGATIVE NEGATIVE   RBC / HPF >50 (H) 0 - 5 RBC/hpf   WBC, UA 0-5 0 - 5 WBC/hpf   Bacteria, UA NONE SEEN NONE SEEN   Squamous Epithelial / HPF 0-5 0 - 5 /HPF   Mucus PRESENT     Comment: Performed at [facility], [address], [place], [place] [address]  Lactic acid, plasma     Status: Abnormal    Collection Time: 05/05/22  5:30 PM  Result Value Ref Range   Lactic Acid, Venous 2.9 (HH) 0.5 - 1.9 mmol/L    Comment: CRITICAL RESULT CALLED TO, READ BACK BY AND VERIFIED WITH K,YUAL RN @1935  05/05/22 E,BENTON Performed at [facility], [address] [place], [place]   I-Stat venous blood gas, [facility], [facility], [facility])     Status: Abnormal   Collection Time: 05/05/22  9:19 PM  Result Value Ref Range   pH, Ven 7.507 (H) 7.25 - 7.43   pCO2, Ven 28.1 (L) 44 - 60 mmHg   pO2, Ven 35 32 - 45 mmHg   Bicarbonate 22.3 20.0 - 28.0 mmol/L   TCO2 23 22 - 32 mmol/L   O2 Saturation 75 %   Acid-Base Excess 0.0 0.0 - 2.0 mmol/L   Sodium 141 135 - 145 mmol/L   Potassium 3.4 (L) 3.5 - 5.1 mmol/L   Calcium, Ion 0.99 (L) 1.15 - 1.40 mmol/L   HCT 40.0 39.0 - 52.0 %   Hemoglobin 13.6 13.0 - 17.0 g/dL   Sample type VENOUS    Comment NOTIFIED PHYSICIAN    DG Chest Portable 1 View  Result Date: 05/05/2022 CLINICAL DATA:  SOB hypotension EXAM: PORTABLE CHEST 1 VIEW COMPARISON:  January tenth and 13, 2024 FINDINGS: The cardiomediastinal silhouette is unchanged and enlarged in contour.Status post median sternotomy and CABG. Tortuous thoracic aorta. Atherosclerotic calcifications. There is a moderate RIGHT pleural effusion, similar in comparison to prior. No pneumothorax. Persistent bibasilar homogeneous opacities. IMPRESSION:  Similar appearance of moderate RIGHT pleural effusion and favored bibasilar atelectasis. Electronically Signed   By: Valentino Saxon M.D.   On: 05/05/2022 17:33    Pending Labs Unresulted Labs (From admission, onward)     Start     Ordered   05/06/22 0500  Hemoglobin A1c  Tomorrow morning,   R       Comments: To assess prior glycemic control   Question:  Specimen collection method  Answer:  Lab=Lab collect   05/05/22 2204   05/06/22 0500  CBC with Differential/Platelet  Tomorrow morning,   R       Question:  Specimen collection method  Answer:  Lab=Lab collect   05/05/22  2205   05/06/22 0500  Comprehensive metabolic panel  Tomorrow morning,   R       Question:  Specimen collection method  Answer:  Lab=Lab collect   05/05/22 2205   05/06/22 0500  Magnesium  Tomorrow morning,   R       Question:  Specimen collection method  Answer:  Lab=Lab collect   05/05/22 2205   05/06/22 0500  Phosphorus  Tomorrow morning,   R       Question:  Specimen collection method  Answer:  Lab=Lab collect   05/05/22 2205   05/05/22 2042  MRSA Next Gen by PCR, Nasal  (MRSA Screening)  Once,   URGENT        05/05/22 2041   05/05/22 2037  Blood culture (routine x 2)  BLOOD CULTURE X 2,   R (with STAT occurrences)      05/05/22 2036   05/05/22 2034  Resp panel by RT-PCR (RSV, Flu A&B, Covid) Anterior Nasal Swab  (Resp panel by RT-PCR (RSV, Flu A&B, Covid))  Once,   URGENT        05/05/22 2033   05/05/22 1730  Lactic acid, plasma  Now then every 2 hours,   R (with STAT occurrences)      05/05/22 1729   05/05/22 1730  Blood gas, venous  Once,   R        05/05/22 1729            Vitals/Pain Today's Vitals   05/05/22 1745 05/05/22 1800 05/05/22 1820 05/05/22 1821  BP: 101/74 101/86    Pulse: (!) 50 (!) 116    Resp: (!) 26 (!) 27    Temp:  (!) 100.9 F (38.3 C)    TempSrc:      SpO2: 99% 95%  95%  Weight:   102.1 kg   Height:   5\' 8"  (1.727 m)     Isolation Precautions Airborne and Contact precautions  Medications Medications  furosemide (LASIX) injection 40 mg (has no administration in time range)  ceFEPIme (MAXIPIME) 2 g in sodium chloride 0.9 % 100 mL IVPB (has no administration in time range)  acetaminophen (TYLENOL) suppository 650 mg (has no administration in time range)  vancomycin (VANCOREADY) IVPB 2000 mg/400 mL (has no administration in time range)  vancomycin (VANCOREADY) IVPB 1750 mg/350 mL (has no administration in time range)  furosemide (LASIX) injection 40 mg (has no administration in time range)  insulin aspart (novoLOG) injection 0-9 Units (has no  administration in time range)  0.9 %  sodium chloride infusion (has no administration in time range)  norepinephrine (LEVOPHED) 4mg  in 228mL (0.016 mg/mL) premix infusion (has no administration in time range)    Mobility non-ambulatory High fall risk   Focused Assessments Cardiac Assessment Handoff:  No results found for: "CKTOTAL", "CKMB", "CKMBINDEX", "TROPONINI" No results found for: "DDIMER" Does the Patient currently have chest pain? No    R Recommendations: See Admitting Provider Note  Report given to:   Additional Notes:

## 2022-05-05 NOTE — ED Provider Triage Note (Signed)
Emergency Medicine Provider Triage Evaluation Note  MAK BONNY , a 72 y.o. male  was evaluated in triage.  Pt complains of weakness and shortness of breath for the past week..  Patient at [facility] week ago for weakness and CHF.  He was admitted for CHF exacerbation for 3 days.  He was getting medications was making him "pee a lot" but ended up leaving AMA.  Family took him home and saw that he was having weakness and shortness of breath again so they brought him back up here.  I am not able to see any previous records from care everywhere. He denies any abdominal pain or black stools.   EMS - Last blood pressure 100/52.  Satting between 88 to 94% on room air.  Review of Systems  Positive:  Negative:   Physical Exam  SpO2 94%  Gen:   Awake, no distress   Resp:  Normal effort  MSK:   Moves extremities without difficulty  Other:  2+ pitting edema to patient's bilateral lower extremities.  Distended abdomen without any tenderness.  Soft.  Mild tachypnea.  Medical Decision Making  Medically screening exam initiated at 4:32 PM.  Appropriate orders placed.  [name] was informed that the remainder of the evaluation will be completed by another provider, this initial triage assessment does not replace that evaluation, and the importance of remaining in the ED until their evaluation is complete.  Patient hypotensive 89/70, tachycardic in the 110s.  His EKG shows A-fib with RVR.  Given his vital signs although he is not in any acute distress, patient will need to be moved back to room immediately.  I spoke to charge, Gilberto Better to room the patient immediately.  She reports that she will find the room now.   Sherrell Puller, [place] 05/05/22 1642

## 2022-05-05 NOTE — Progress Notes (Signed)
Pharmacy Antibiotic Note  Jerry Myers is a 72 y.o. male admitted on 05/05/2022 presenting with SOB, weakness, concern for sepsis.  Pharmacy has been consulted for vancomycin dosing.  Cefepime per MD  Plan: Vancomycin 2000 mg IV x 1, then 1750 mg IV q 48h (eAUC 483) Add MRSA PCR Monitor renal function, Cx and clinical progression to narrow Vancomycin levels as indicated  Height: 5\' 8"  (172.7 cm) Weight: 102.1 kg (225 lb) IBW/kg (Calculated) : 68.4  Temp (24hrs), Avg:99.9 F (37.7 C), Min:98.8 F (37.1 C), Max:100.9 F (38.3 C)  Recent Labs  Lab 05/05/22 1635 05/05/22 1730  WBC 5.6  --   CREATININE 1.70*  --   LATICACIDVEN  --  2.9*    Estimated Creatinine Clearance: 44.8 mL/min (A) (by C-G formula based on SCr of 1.7 mg/dL (H)).    Allergies  Allergen Reactions   Trulicity [Dulaglutide] Nausea And Vomiting    Bertis Ruddy, PharmD, [facility] Clinical Pharmacist ED Pharmacist Phone # [phone] 05/05/2022 8:39 PM

## 2022-05-05 NOTE — H&P (Addendum)
History and Physical  Jerry Myers WGN:[id number] DOB: 04-Aug-1950 DOA: 05/05/2022  Referring physician: Burnis Medin  PCP: Patient, No Pcp Per  Outpatient Specialists: Cardiology. Patient coming from: Home.  Chief Complaint: Shortness of breath.  HPI: Jerry Myers is a 72 y.o. male with medical history significant for coronary artery disease, status post CABG, 10-12 years ago, hypertension, type 2 diabetes, hyperlipidemia, HFrEF less than 20% who presented to [facility] ED after being released from the hospital at [facility].  Was admitted for acute on chronic systolic CHF and atypical atrial flutter, was seen by cardiology.  The patient states he wanted to go home today because the hospital's bed was not comfortable.  History is mainly obtained from EDP and from the patient's family member who reports that when the patient returned home from the hospital today, he was significantly dyspneic with minimal activity and EMS was asked to bring him to the hospital at [facility].  Upon arrival to the ED at  [facility] the patient was overtly dyspneic and was placed on BiPAP upon initial assessment.  Endorses feeling too short of breath to move around.  No chest pain at the time of this visit.  The patient was weaned off of BiPAP and placed on 3 L nasal cannula.  Normal oxygen supplementation at baseline.  While in the ED he became febrile with Tmax of 102, tachycardic with heart rate of 117, tachypneic with respiration rate of 38.  EDP discussed the case with cardiology who recommended diuresing and if blood pressure cannot tolerate may start vasopressor.  EDP consulted PCCM.  The patient was admitted by [facility], hospitalist service.  ED Course: Tmax of 92.  BP 94/71, pulse 117, respiratory rate 35, O2 saturation 92% on 3 L [place] de Gutierrez.  Lab studies remarkable for potassium 3.4, BUN 27, creatinine 1.70 with baseline creatinine of 1.32 (2021) with GFR of 54.  Serum glucose 189.  BNP 1029.  First set  high-sensitivity troponin 93.  CBC essentially unremarkable.  Review of Systems: Review of systems as noted in the HPI. All other systems reviewed and are negative.   Past Medical History:  Diagnosis Date   Abnormal nuclear cardiac imaging test 06/03/2018   Anxiety    Arthritis    Atrial flutter by electrocardiogram (HCC) 09/09/2018   Bradycardia 03/18/2018   CAD (coronary artery disease) 01/26/2019   Cardiomyopathy (HCC) 04/19/2015   Ejection fraction 4045% in the fall of 2016   Cataract    right eye   Chest pain 06/05/2018   Coronary artery disease    Coronary artery disease of native artery of native heart with stable angina pectoris (HCC) 03/18/2015   Depression    Dyspnea    Essential hypertension 12/29/2014   GERD (gastroesophageal reflux disease)    History of kidney stones    20 yrs. 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   Past Surgical History:  Procedure Laterality Date   CARDIAC CATHETERIZATION     CORONARY ARTERY BYPASS GRAFT N/A 09/05/2018   Procedure: CORONARY ARTERY BYPASS GRAFTING (CABG) x4, ON PUMP, USING LEFT INTERNAL MAMMARY ARTERY AND RIGHT AND LEFT GREAT SAPHENOUS VEIN HARVESTED ENDOSCOPICALLY;  Surgeon: Kerin Perna, MD;  Location: [facility] OR;  Service: Open Heart Surgery;  Laterality: N/A;   FOOT SURGERY     LEFT HEART CATH AND CORONARY ANGIOGRAPHY N/A 06/05/2018  Procedure: LEFT HEART CATH AND CORONARY ANGIOGRAPHY;  Surgeon: Yvonne Kendall, MD;  Location: [facility] INVASIVE CV LAB;  Service: Cardiovascular;  Laterality: N/A;   LUNG SURGERY     TEE WITHOUT CARDIOVERSION N/A 09/05/2018   Procedure: TRANSESOPHAGEAL ECHOCARDIOGRAM (TEE);  Surgeon: Donata Clay, Theron Arista, MD;  Location: [facility] OR;  Service: Open Heart Surgery;  Laterality: N/A;    Social History:  reports that he has never smoked. He has never used smokeless  tobacco. He reports that he does not currently use drugs. He reports that he does not drink alcohol.   Allergies  Allergen Reactions   Trulicity [Dulaglutide] Nausea And Vomiting    Family History  Problem Relation Age of Onset   Diabetes Mother    Other Mother    Arrhythmia Mother    Diabetes Father    Hypertension Father    Diabetes Sister    Heart attack Brother    Arrhythmia Brother    Hypertension Brother    Congestive Heart Failure Brother    Kidney disease Brother    Diabetes Sister       Prior to Admission medications   Medication Sig Start Date End Date Taking? Authorizing Provider  aspirin EC 81 MG tablet Take 81 mg by mouth daily.    [provider]  atorvastatin (LIPITOR) 20 MG tablet Take 20 mg by mouth daily.    [provider]  carvedilol (COREG) 3.125 MG tablet Take 1 tablet (3.125 mg total) by mouth 2 (two) times daily with a meal. 09/12/18   Gold, Glenice Laine, PA-C  clopidogrel (PLAVIX) 75 MG tablet TAKE 1 TABLET BY MOUTH ONCE DAILY 07/22/19   Revankar, Aundra Dubin, MD  escitalopram (LEXAPRO) 5 MG tablet Take 5 mg by mouth daily. 07/23/18   [provider]  gabapentin (NEURONTIN) 300 MG capsule Take 300 mg by mouth 2 (two) times daily.    [provider]  glimepiride (AMARYL) 2 MG tablet Take 2 mg by mouth daily with breakfast.     [provider]  glipiZIDE (GLUCOTROL XL) 5 MG 24 hr tablet Take 5 mg by mouth daily with breakfast.    [provider]  hydrochlorothiazide (HYDRODIURIL) 25 MG tablet Take 1 tablet (25 mg total) by mouth daily. 02/12/19   Revankar, Aundra Dubin, MD  lisinopril (ZESTRIL) 40 MG tablet Take 40 mg by mouth daily.    [provider]  meloxicam (MOBIC) 7.5 MG tablet Take 7.5 mg by mouth daily.    [provider]  metFORMIN (GLUCOPHAGE) 1000 MG tablet Take 1,000 mg by mouth 2 (two) times daily. 12/30/19   [provider]  nitroGLYCERIN (NITROSTAT) 0.4 MG SL tablet Place 1  tablet (0.4 mg total) under the tongue every 5 (five) minutes as needed for chest pain. Patient needs appointment for further refills. 1 st attempt 01/29/22   Revankar, Aundra Dubin, MD  nystatin cream (MYCOSTATIN) Apply 1 Application topically daily as needed for dry skin. 09/11/19   [provider]  sitaGLIPtin (JANUVIA) 100 MG tablet Take 100 mg by mouth daily.    [provider]    Physical Exam: BP 101/86   Pulse (!) 116   Temp (!) 100.9 F (38.3 C)   Resp (!) 27   Ht 5\' 8"  (1.727 m)   Wt 102.1 kg   SpO2 95%   BMI 34.21 kg/m   General: 72 y.o. year-old male well developed well nourished in no acute distress.  Alert and oriented x3. Cardiovascular: Irregular rate  and rhythm with no rubs or gallops.  No thyromegaly or JVD noted.  No lower extremity edema. 2/4 pulses in all 4 extremities. Respiratory: Clear to auscultation with no wheezes or rales. Good inspiratory effort. Abdomen: Soft nontender nondistended with normal bowel sounds x4 quadrants. Muskuloskeletal: No cyanosis, clubbing or edema noted bilaterally Neuro: CN II-XII intact, strength, sensation, reflexes Skin: No ulcerative lesions noted or rashes Psychiatry: Judgement and insight appear normal. Mood is appropriate for condition and setting          Labs on Admission:  Basic Metabolic Panel: Recent Labs  Lab 05/05/22 1635 05/05/22 2119  NA 140 141  K 3.6 3.4*  CL 102  --   CO2 24  --   GLUCOSE 189*  --   BUN 27*  --   CREATININE 1.70*  --   CALCIUM 8.9  --    Liver Function Tests: Recent Labs  Lab 05/05/22 1635  AST 25  ALT 19  ALKPHOS 70  BILITOT 1.0  PROT 6.4*  ALBUMIN 3.5   No results for input(s): "LIPASE", "AMYLASE" in the last 168 hours. No results for input(s): "AMMONIA" in the last 168 hours. CBC: Recent Labs  Lab 05/05/22 1635 05/05/22 2119  WBC 5.6  --   NEUTROABS 3.9  --   HGB 13.2 13.6  HCT 40.9 40.0  MCV 91.3  --   PLT 271  --    Cardiac Enzymes: No results for  input(s): "CKTOTAL", "CKMB", "CKMBINDEX", "TROPONINI" in the last 168 hours.  BNP (last 3 results) Recent Labs    05/05/22 1635  BNP 1,029.1*    ProBNP (last 3 results) No results for input(s): "PROBNP" in the last 8760 hours.  CBG: Recent Labs  Lab 05/05/22 1640  GLUCAP 182*    Radiological Exams on Admission: DG Chest Portable 1 View  Result Date: 05/05/2022 CLINICAL DATA:  SOB hypotension EXAM: PORTABLE CHEST 1 VIEW COMPARISON:  January tenth and 13, 2024 FINDINGS: The cardiomediastinal silhouette is unchanged and enlarged in contour.Status post median sternotomy and CABG. Tortuous thoracic aorta. Atherosclerotic calcifications. There is a moderate RIGHT pleural effusion, similar in comparison to prior. No pneumothorax. Persistent bibasilar homogeneous opacities. IMPRESSION: Similar appearance of moderate RIGHT pleural effusion and favored bibasilar atelectasis. Electronically Signed   By: Valentino Saxon M.D.   On: 05/05/2022 17:33    EKG: I independently viewed the EKG done and my findings are as followed: Atrial fibrillation rate of 114.  Occasional PVCs, nonspecific ST-T changes.  QTc 485.  Assessment/Plan Present on Admission:  Acute on chronic systolic (congestive) heart failure (HCC)  Principal Problem:   Acute on chronic systolic (congestive) heart failure (HCC)  Acute on chronic HFrEF, LVEF less than 20% Recently admitted at outside facility, discharged from [facility] on the same day of presentation. 2D echo done during that hospitalization, showing LVEF less than 20%. The patient was seen by cardiology, offered a vest which he refused. Cardiology consulted and recommended further diuresing, if BP cannot tolerate may consider vasopressors.  PCCM consulted by EDP.  Elevated troponin, suspect demand ischemia in the setting of hypoxia. First set high-sensitivity troponin 93 Trend troponin No evidence of acute ischemia on 12 EKG. Further management per  cardiology.  SIRS, unclear etiology Fever with Tmax 102, tachycardia, tachypnea No leukocytosis Follow COVID-19 screening test, respiratory panel, influenza A&B, pending. UA negative for pyuria Follow peripheral blood cultures. Monitor fever curve and WBC.  Right pleural effusion, seen on chest x-ray Personally reviewed chest x-ray done  on admission which shows right pleural effusion Diuresing in place  Acute hypoxic respiratory failure secondary to above Not on oxygen supplementation at baseline Currently on 3 L to maintain O2 saturation greater than 90%. Wean off oxygen supplementation as tolerated. Diuresis, flutter valve, incentive spirometer.  Essential hypertension, BPs are currently soft Maintain MAP greater than 65 Low threshold for vasopressor, defer to cardiology. Closely monitor vital signs  Atypical atrial flutter/atrial fibrillation Resume home Eliquis and amiodarone, initiated by cardiology at outside facility Defer further management to cardiology.  Coronary artery disease status post CABG Regimen per cardiology.  Type 2 diabetes with hyperglycemia Obtain hemoglobin A1c Start insulin sliding scale.  Generalized weakness PT OT assessment Fall precautions    Critical care time: 65 minutes.    DVT prophylaxis: Home Eliquis  Code Status: Full code, as stated by the patient himself  Family Communication: None at bedside.  Disposition Plan: Admitted to progressive care unit.  Consults called: Cardiology and PCCM consulted by EDP.  Admission status: Inpatient status.   Status is: Inpatient The patient requires at least 2 midnights for further evaluation and treatment of present condition.   Kayleen Memos MD [facility] Pager [phone]  If 7PM-7AM, please contact night-coverage [email or web address] Password [facility]  05/05/2022, 9:34 PM

## 2022-05-05 NOTE — Progress Notes (Signed)
ANTICOAGULATION CONSULT NOTE - Initial Consult  Pharmacy Consult for Apixaban  Indication: atrial fibrillation  Allergies  Allergen Reactions   Trulicity [Dulaglutide] Nausea And Vomiting   Patient Measurements: Height: 5\' 8"  (172.7 cm) Weight: 102.1 kg (225 lb) IBW/kg (Calculated) : 68.4  Vital Signs: Temp: 100.9 F (38.3 C) (01/13 1800) Temp Source: Oral (01/13 1639) BP: 101/86 (01/13 1800) Pulse Rate: 116 (01/13 1800)  Labs: Recent Labs    05/05/22 1635 05/05/22 2119  HGB 13.2 13.6  HCT 40.9 40.0  PLT 271  --   CREATININE 1.70*  --   TROPONINIHS 93*  --     Estimated Creatinine Clearance: 44.8 mL/min (A) (by C-G formula based on SCr of 1.7 mg/dL (H)).   Medical History: Past Medical History:  Diagnosis Date   Abnormal nuclear cardiac imaging test 06/03/2018   Anxiety    Arthritis    Atrial flutter by electrocardiogram ([place]) 09/09/2018   Bradycardia 03/18/2018   CAD (coronary artery disease) 01/26/2019   Cardiomyopathy ([place]) 04/19/2015   Ejection fraction 4045% in the fall of 2016   Cataract    right eye   Chest pain 06/05/2018   Coronary artery disease    Coronary artery disease of native artery of native heart with stable angina pectoris ([place]) 03/18/2015   Depression    Dyspnea    Essential hypertension 12/29/2014   GERD (gastroesophageal reflux disease)    History of kidney stones    20 yrs. 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 CBC good. Noted Scr 1.7.   Goal of Therapy:  Monitor platelets by anticoagulation protocol: Yes   Plan:  Apixaban 5 mg BID Daily CBC while inpatient  Monitor for bleeding  Narda Bonds, PharmD, BCPS Clinical Pharmacist Phone: [phone]

## 2022-05-05 NOTE — ED Triage Notes (Signed)
Pt arrived via [facility]. Pt was at [facility] for 3-4 days for CHF exacerbation and pt left AMA, because he was tired of sitting in the bed. Pt's family seen pt was weak and SOB and called EMS to have him sent here.

## 2022-05-05 NOTE — ED Notes (Signed)
Patient continues to pull Bipap off of his face and say he needs to stand up. Patient encouraged to keep Bipap on.

## 2022-05-05 NOTE — H&P (Signed)
NAME:  Jerry Myers, MRN:  [id number], DOB:  03/27/1951, LOS: 0 ADMISSION DATE:  05/05/2022, CONSULTATION DATE:  05/05/2022 REFERRING MD:  Dr. Nevada Crane, [facility], CHIEF COMPLAINT:  Short of breath   History of Present Illness:  72 yo male was at [facility] for CHF exacerbation and discharged home on 05/05/22 per patient request.  Family immediately called 911 and he was brought to [facility] ER.  He had dyspnea and leg swelling.  He was hypotensive and had fever.  He was started on Bipap in the ER, but patient requested to have this taken off.  Cardiology consulted by EDP.  Patient was confused and not able to provide complete medical history.  Due to respiratory distress, fever, and hypotension PCCM asked to assess for ICU admission.  Pertinent  Medical History  Anxiety, Arthritis, A flutter, CAD s/p CABG, Combined CHF, Depression, HTN, GERD, Nephrolithiasis, NSVT, DM type 2  Significant Hospital Events: Including procedures, antibiotic start and stop dates in addition to other pertinent events   1/13 admit, cardiology consulted, start ABx  Interim History / Subjective:    Objective   Blood pressure 101/86, pulse (!) 116, temperature (!) 100.9 F (38.3 C), resp. rate (!) 27, height 5\' 8"  (1.727 m), weight 102.1 kg, SpO2 95 %.    FiO2 (%):  [40 %] 40 %   Intake/Output Summary (Last 24 hours) at 05/05/2022 2201 Last data filed at 05/05/2022 1830 Gross per 24 hour  Intake --  Output 1100 ml  Net -1100 ml   Filed Weights   05/05/22 1820  Weight: 102.1 kg    Examination:  General - alert, using some accessory muscles Eyes - pupils reactive ENT - no sinus tenderness, no stridor Cardiac - irregular Chest - basilar rales Abdomen - soft, non tender, + bowel sounds Extremities - 1+ edema Skin - no rashes Neuro - able to state his name and that he is in hospital, follows simple commands, moves all extremities  Resolved Hospital Problem list     Assessment & Plan:   Acute  respiratory distress. - from fever, pleural effusions in setting of CHF, atelectasis, and possible pneumonia - oxygen to keep SpO2 > 92% - Bipap prn - monitor need for intubation in ICU  Fever with concern for sepsis. - possible sources include pneumonia or UTI - continue Abx - f/u blood culture from 1/13  Acute on chronic systolic CHF. A fib/flutter. Hx of CAD s/p CABG. HLD. - cardiology consulted by EDP - diurese as tolerated - might need pressor/inotropic support - continue eliquis  Acute metabolic encephalopathy from sepsis. Hx of depression, neuropathy. - monitor mental status - hold outpt remeron, neurontin  DM type 2 poorly controlled with hyperglycemia. - SSI - hold outpt januvia  CKD 3a. - monitor urine outpt - f/u BMET  Best Practice (right click and "Reselect all SmartList Selections" daily)   Diet/type: NPO DVT prophylaxis: DOAC GI prophylaxis: N/A Lines: N/A Foley:  Yes, and it is still needed Code Status:  full code Last date of multidisciplinary goals of care discussion [x]   Labs   CBC: Recent Labs  Lab 05/05/22 1635 05/05/22 2119  WBC 5.6  --   NEUTROABS 3.9  --   HGB 13.2 13.6  HCT 40.9 40.0  MCV 91.3  --   PLT 271  --     Basic Metabolic Panel: Recent Labs  Lab 05/05/22 1635 05/05/22 2119  NA 140 141  K 3.6 3.4*  CL 102  --  CO2 24  --   GLUCOSE 189*  --   BUN 27*  --   CREATININE 1.70*  --   CALCIUM 8.9  --    GFR: Estimated Creatinine Clearance: 44.8 mL/min (A) (by C-G formula based on SCr of 1.7 mg/dL (H)). Recent Labs  Lab 05/05/22 1635 05/05/22 1730  WBC 5.6  --   LATICACIDVEN  --  2.9*    Liver Function Tests: Recent Labs  Lab 05/05/22 1635  AST 25  ALT 19  ALKPHOS 70  BILITOT 1.0  PROT 6.4*  ALBUMIN 3.5   No results for input(s): "LIPASE", "AMYLASE" in the last 168 hours. No results for input(s): "AMMONIA" in the last 168 hours.  ABG    Component Value Date/Time   PHART 7.435 09/07/2018 0440    PCO2ART 34.1 09/07/2018 0440   PO2ART 64.0 (L) 09/07/2018 0440   HCO3 22.3 05/05/2022 2119   TCO2 23 05/05/2022 2119   ACIDBASEDEF 1.0 09/07/2018 0440   O2SAT 75 05/05/2022 2119     Coagulation Profile: No results for input(s): "INR", "PROTIME" in the last 168 hours.  Cardiac Enzymes: No results for input(s): "CKTOTAL", "CKMB", "CKMBINDEX", "TROPONINI" in the last 168 hours.  HbA1C: Hgb A1c MFr Bld  Date/Time Value Ref Range Status  12/31/2019 11:05 AM 13.4 (H) 4.8 - 5.6 % Final    Comment:             Prediabetes: 5.7 - 6.4          Diabetes: >6.4          Glycemic control for adults with diabetes: <7.0   09/03/2018 11:50 AM 9.2 (H) 4.8 - 5.6 % Final    Comment:    (NOTE) Pre diabetes:          5.7%-6.4% Diabetes:              >6.4% Glycemic control for   <7.0% adults with diabetes     CBG: Recent Labs  Lab 05/05/22 1640  GLUCAP 182*    Review of Systems:   Unable to obtain.  Past Medical History:  He,  has a past medical history of Abnormal nuclear cardiac imaging test (06/03/2018), Anxiety, Arthritis, Atrial flutter by electrocardiogram ([place]) (09/09/2018), Bradycardia (03/18/2018), CAD (coronary artery disease) (01/26/2019), Cardiomyopathy ([place]) (04/19/2015), Cataract, Chest pain (06/05/2018), Coronary artery disease, Coronary artery disease of native artery of native heart with stable angina pectoris ([place]) (03/18/2015), Depression, Dyspnea, Essential hypertension (12/29/2014), GERD (gastroesophageal reflux disease), History of kidney stones, NSVT (nonsustained ventricular tachycardia) ([place]) (06/03/2018), S/P CABG x 4 (09/05/2018), Type 2 diabetes mellitus without complication ([place]) (10/21/6965), and Ventricular extrasystoles (12/29/2014).   Surgical History:   Past Surgical History:  Procedure Laterality Date   CARDIAC CATHETERIZATION     CORONARY ARTERY BYPASS GRAFT N/A 09/05/2018   Procedure: CORONARY ARTERY BYPASS GRAFTING (CABG) x4, ON PUMP, USING LEFT INTERNAL MAMMARY  ARTERY AND RIGHT AND LEFT GREAT SAPHENOUS VEIN HARVESTED ENDOSCOPICALLY;  Surgeon: Ivin Poot, MD;  Location: [facility];  Service: Open Heart Surgery;  Laterality: N/A;   FOOT SURGERY     LEFT HEART CATH AND CORONARY ANGIOGRAPHY N/A 06/05/2018   Procedure: LEFT HEART CATH AND CORONARY ANGIOGRAPHY;  Surgeon: Nelva Bush, MD;  Location: [place] CV LAB;  Service: Cardiovascular;  Laterality: N/A;   LUNG SURGERY     TEE WITHOUT CARDIOVERSION N/A 09/05/2018   Procedure: TRANSESOPHAGEAL ECHOCARDIOGRAM (TEE);  Surgeon: Prescott Gum, Collier Salina, MD;  Location: [facility];  Service: Open Heart Surgery;  Laterality: N/A;     Social History:   reports that he has never smoked. He has never used smokeless tobacco. He reports that he does not currently use drugs. He reports that he does not drink alcohol.   Family History:  His family history includes Arrhythmia in his brother and mother; Congestive Heart Failure in his brother; Diabetes in his father, mother, sister, and sister; Heart attack in his brother; Hypertension in his brother and father; Kidney disease in his brother; Other in his mother.   Allergies Allergies  Allergen Reactions   Trulicity [Dulaglutide] Nausea And Vomiting     Home Medications  Prior to Admission medications   Medication Sig Start Date End Date Taking? Authorizing Provider  aspirin EC 81 MG tablet Take 81 mg by mouth daily.    [provider]  atorvastatin (LIPITOR) 20 MG tablet Take 20 mg by mouth daily.    [provider]  carvedilol (COREG) 3.125 MG tablet Take 1 tablet (3.125 mg total) by mouth 2 (two) times daily with a meal. 09/12/18   Gold, Glenice Laine, PA-C  clopidogrel (PLAVIX) 75 MG tablet TAKE 1 TABLET BY MOUTH ONCE DAILY 07/22/19   Revankar, Aundra Dubin, MD  escitalopram (LEXAPRO) 5 MG tablet Take 5 mg by mouth daily. 07/23/18   [provider]  gabapentin (NEURONTIN) 300 MG capsule Take 300 mg by mouth 2 (two) times daily.    [provider]  glimepiride (AMARYL) 2 MG tablet Take 2 mg by mouth daily with breakfast.     [provider]  glipiZIDE (GLUCOTROL XL) 5 MG 24 hr tablet Take 5 mg by mouth daily with breakfast.    [provider]  hydrochlorothiazide (HYDRODIURIL) 25 MG tablet Take 1 tablet (25 mg total) by mouth daily. 02/12/19   Revankar, Aundra Dubin, MD  lisinopril (ZESTRIL) 40 MG tablet Take 40 mg by mouth daily.    [provider]  meloxicam (MOBIC) 7.5 MG tablet Take 7.5 mg by mouth daily.    [provider]  metFORMIN (GLUCOPHAGE) 1000 MG tablet Take 1,000 mg by mouth 2 (two) times daily. 12/30/19   [provider]  nitroGLYCERIN (NITROSTAT) 0.4 MG SL tablet Place 1 tablet (0.4 mg total) under the tongue every 5 (five) minutes as needed for chest pain. Patient needs appointment for further refills. 1 st attempt 01/29/22   Revankar, Aundra Dubin, MD  nystatin cream (MYCOSTATIN) Apply 1 Application topically daily as needed for dry skin. 09/11/19   [provider]  sitaGLIPtin (JANUVIA) 100 MG tablet Take 100 mg by mouth daily.    [provider]     Critical care time: 43 minutes  Coralyn Helling, MD [facility] Pulmonary/Critical Care Pager - [phone] 05/05/2022, 10:20 PM

## 2022-05-05 NOTE — ED Provider Notes (Signed)
[facility] EMERGENCY DEPARTMENT Provider Note   CSN: [id number] Arrival date & time: 05/05/22  1629     History {Add pertinent medical, surgical, social history, OB history to HPI:1} No chief complaint on file.   Jerry Myers is a 72 y.o. male brought in by his family for weakness and shortness of breath.  Patient's family gives history and are of not well versed in the patient's history.'s the patient is extremely short of breath and patient placed on BiPAP rapidly upon my assessment.  He does state that he started feeling short of breath about a week ago and denies having ever felt like this in the past.  Patient's brother reports that he has a history of bypass graft and was discharged from [facility] this morning.  He states that he is too weak to walk and he is too short of breath to move around.  He felt like he was unfit to be discharged.  According to the patient's brother he was supposed to get a "defibrillator" but he thinks his brother did not want to go through with the procedure.  He is unable to tell me about the patient's medications.  HPI     Home Medications Prior to Admission medications   Medication Sig Start Date End Date Taking? Authorizing Provider  aspirin EC 81 MG tablet Take 81 mg by mouth daily.    [provider]  atorvastatin (LIPITOR) 20 MG tablet Take 20 mg by mouth daily.    [provider]  carvedilol (COREG) 3.125 MG tablet Take 1 tablet (3.125 mg total) by mouth 2 (two) times daily with a meal. 09/12/18   Gold, Glenice Laine, PA-C  clopidogrel (PLAVIX) 75 MG tablet TAKE 1 TABLET BY MOUTH ONCE DAILY 07/22/19   Revankar, Aundra Dubin, MD  escitalopram (LEXAPRO) 5 MG tablet Take 5 mg by mouth daily. 07/23/18   [provider]  gabapentin (NEURONTIN) 300 MG capsule Take 300 mg by mouth 2 (two) times daily.    [provider]  glimepiride (AMARYL) 2 MG tablet Take 2 mg by mouth daily with breakfast.      [provider]  glipiZIDE (GLUCOTROL XL) 5 MG 24 hr tablet Take 5 mg by mouth daily with breakfast.    [provider]  hydrochlorothiazide (HYDRODIURIL) 25 MG tablet Take 1 tablet (25 mg total) by mouth daily. 02/12/19   Revankar, Aundra Dubin, MD  lisinopril (ZESTRIL) 40 MG tablet Take 40 mg by mouth daily.    [provider]  meloxicam (MOBIC) 7.5 MG tablet Take 7.5 mg by mouth daily.    [provider]  metFORMIN (GLUCOPHAGE) 1000 MG tablet Take 1,000 mg by mouth 2 (two) times daily. 12/30/19   [provider]  nitroGLYCERIN (NITROSTAT) 0.4 MG SL tablet Place 1 tablet (0.4 mg total) under the tongue every 5 (five) minutes as needed for chest pain. Patient needs appointment for further refills. 1 st attempt 01/29/22   Revankar, Aundra Dubin, MD  nystatin cream (MYCOSTATIN) Apply 1 Application topically daily as needed for dry skin. 09/11/19   [provider]  sitaGLIPtin (JANUVIA) 100 MG tablet Take 100 mg by mouth daily.    [provider]      Allergies    Trulicity [dulaglutide]    Review of Systems   Review of Systems  Physical Exam Updated Vital Signs BP (!) 89/70 (BP Location: Right Arm)   Pulse (!) 116   Temp 98.8 F (37.1 C) (  Oral)   Resp 16   SpO2 95%  Physical Exam Vitals and nursing note reviewed.  Constitutional:      General: He is not in acute distress.    Appearance: He is well-developed. He is ill-appearing. He is not diaphoretic.  HENT:     Head: Normocephalic and atraumatic.     Mouth/Throat:     Mouth: Mucous membranes are dry.  Eyes:     General: No scleral icterus.    Extraocular Movements: Extraocular movements intact.     Conjunctiva/sclera: Conjunctivae normal.     Pupils: Pupils are equal, round, and reactive to light.  Cardiovascular:     Rate and Rhythm: Regular rhythm. Tachycardia present.     Heart sounds: Normal heart sounds.     Comments: Bilateral lower extremity edema which is pitting,  2-3+.  All extremities are cool to the touch. Pulmonary:     Effort: Tachypnea present. No respiratory distress.     Breath sounds: Decreased air movement present. Decreased breath sounds and rales present.  Abdominal:     Palpations: Abdomen is soft.     Tenderness: There is no abdominal tenderness.  Musculoskeletal:     Cervical back: Normal range of motion and neck supple.     Right lower leg: Edema present.     Left lower leg: Edema present.  Skin:    General: Skin is warm and dry.  Neurological:     Mental Status: He is alert.  Psychiatric:        Behavior: Behavior normal.    OP medical discharge information below:          ED Results / Procedures / Treatments   Labs (all labs ordered are listed, but only abnormal results are displayed) Labs Reviewed  CBG MONITORING, ED - Abnormal; Notable for the following components:      Result Value   Glucose-Capillary 182 (*)    All other components within normal limits  BRAIN NATRIURETIC PEPTIDE  CBC WITH DIFFERENTIAL/PLATELET  COMPREHENSIVE METABOLIC PANEL  URINALYSIS, ROUTINE W REFLEX MICROSCOPIC  LACTIC ACID, PLASMA  LACTIC ACID, PLASMA  BLOOD GAS, VENOUS  TROPONIN I (HIGH SENSITIVITY)    EKG EKG Interpretation  Date/Time:  Saturday May 05 2022 16:39:25 EST Ventricular Rate:  114 PR Interval:    QRS Duration: 126 QT Interval:  352 QTC Calculation: 485 R Axis:   -86 Text Interpretation: Atrial fibrillation with rapid ventricular response with premature ventricular or aberrantly conducted complexes Left axis deviation Non-specific intra-ventricular conduction block Cannot rule out Anteroseptal infarct , age undetermined Abnormal ECG When compared with ECG of 06-Sep-2018 07:43, PREVIOUS ECG IS PRESENT Rhythm now A fib ST/T flattening inferior leads Confirmed by Georgina Snell [id number]) on 05/05/2022 4:54:09 PM  Radiology DG Chest Portable 1 View  Result Date: 05/05/2022 CLINICAL DATA:  SOB hypotension  EXAM: PORTABLE CHEST 1 VIEW COMPARISON:  January tenth and 13, 2024 FINDINGS: The cardiomediastinal silhouette is unchanged and enlarged in contour.Status post median sternotomy and CABG. Tortuous thoracic aorta. Atherosclerotic calcifications. There is a moderate RIGHT pleural effusion, similar in comparison to prior. No pneumothorax. Persistent bibasilar homogeneous opacities. IMPRESSION: Similar appearance of moderate RIGHT pleural effusion and favored bibasilar atelectasis. Electronically Signed   By: Valentino Saxon M.D.   On: 05/05/2022 17:33    Procedures Procedures  {Document cardiac monitor, telemetry assessment procedure when appropriate:1}  Medications Ordered in ED Medications - No data to display  ED Course/ Medical Decision Making/ A&P Clinical Course as of 05/05/22  2139  Sat May 05, 2022  1804 Patient complaining that he needs to urinate he was unable to produce urine.  I placed an ultrasound probe on the patient's abdomen which showed a bladder full of urine.  I placed attempt Foley in the patient's bladder.  With good urine return in conjunction with Nurse Elsie Lincoln. [AH]  1806 Informal bedside ultrasound performed by Dr. Nechama Guard shows poor cardiac squeeze.  I have high concern for hypotension secondary to cardiac failure.  Labs are pending. [AH]  2032 Creatinine(!): 1.70 GFR 1.2 on 05/03/22 [AH]  2032 RBC / HPF(!): >50 [AH]  2032 Lactic Acid, Venous(!!): 2.9 [AH]  2054 Dobutamine 2.5 if lactic is worsening [AH]    Clinical Course User Index [AH] Margarita Mail, PA-C   {   Click here for ABCD2, HEART and other calculatorsREFRESH Note before signing :1}                          Medical Decision Making Amount and/or Complexity of Data Reviewed Labs: ordered. Decision-making details documented in ED Course.  Risk Prescription drug management.   ***  {Document critical care time when appropriate:1} {Document review of labs and clinical decision tools ie  heart score, Chads2Vasc2 etc:1}  {Document your independent review of radiology images, and any outside records:1} {Document your discussion with family members, caretakers, and with consultants:1} {Document social determinants of health affecting pt's care:1} {Document your decision making why or why not admission, treatments were needed:1} Final Clinical Impression(s) / ED Diagnoses Final diagnoses:  None    Rx / DC Orders ED Discharge Orders     None

## 2022-05-06 DIAGNOSIS — R6521 Severe sepsis with septic shock: Secondary | ICD-10-CM | POA: Diagnosis not present

## 2022-05-06 DIAGNOSIS — J1100 Influenza due to unidentified influenza virus with unspecified type of pneumonia: Secondary | ICD-10-CM

## 2022-05-06 DIAGNOSIS — I509 Heart failure, unspecified: Secondary | ICD-10-CM

## 2022-05-06 DIAGNOSIS — I4891 Unspecified atrial fibrillation: Secondary | ICD-10-CM

## 2022-05-06 DIAGNOSIS — A419 Sepsis, unspecified organism: Secondary | ICD-10-CM

## 2022-05-06 DIAGNOSIS — I5023 Acute on chronic systolic (congestive) heart failure: Secondary | ICD-10-CM | POA: Diagnosis not present

## 2022-05-06 HISTORY — DX: Heart failure, unspecified: I50.9

## 2022-05-06 LAB — CBC WITH DIFFERENTIAL/PLATELET
Abs Immature Granulocytes: 0.02 10*3/uL (ref 0.00–0.07)
Basophils Absolute: 0 10*3/uL (ref 0.0–0.1)
Basophils Relative: 0 %
Eosinophils Absolute: 0 10*3/uL (ref 0.0–0.5)
Eosinophils Relative: 0 %
HCT: 35.5 % — ABNORMAL LOW (ref 39.0–52.0)
Hemoglobin: 12.1 g/dL — ABNORMAL LOW (ref 13.0–17.0)
Immature Granulocytes: 0 %
Lymphocytes Relative: 9 %
Lymphs Abs: 0.5 10*3/uL — ABNORMAL LOW (ref 0.7–4.0)
MCH: 30.4 pg (ref 26.0–34.0)
MCHC: 34.1 g/dL (ref 30.0–36.0)
MCV: 89.2 fL (ref 80.0–100.0)
Monocytes Absolute: 0.9 10*3/uL (ref 0.1–1.0)
Monocytes Relative: 17 %
Neutro Abs: 4.2 10*3/uL (ref 1.7–7.7)
Neutrophils Relative %: 74 %
Platelets: 220 10*3/uL (ref 150–400)
RBC: 3.98 MIL/uL — ABNORMAL LOW (ref 4.22–5.81)
RDW: 16 % — ABNORMAL HIGH (ref 11.5–15.5)
WBC: 5.7 10*3/uL (ref 4.0–10.5)
nRBC: 0 % (ref 0.0–0.2)

## 2022-05-06 LAB — COMPREHENSIVE METABOLIC PANEL
ALT: 19 U/L (ref 0–44)
ALT: 25 U/L (ref 0–44)
AST: 18 U/L (ref 15–41)
AST: 30 U/L (ref 15–41)
Albumin: 3.2 g/dL — ABNORMAL LOW (ref 3.5–5.0)
Albumin: 3.4 g/dL — ABNORMAL LOW (ref 3.5–5.0)
Alkaline Phosphatase: 62 U/L (ref 38–126)
Alkaline Phosphatase: 68 U/L (ref 38–126)
Anion gap: 13 (ref 5–15)
Anion gap: 14 (ref 5–15)
BUN: 29 mg/dL — ABNORMAL HIGH (ref 8–23)
BUN: 32 mg/dL — ABNORMAL HIGH (ref 8–23)
CO2: 25 mmol/L (ref 22–32)
CO2: 21 mmol/L — ABNORMAL LOW (ref 22–32)
Calcium: 8.4 mg/dL — ABNORMAL LOW (ref 8.9–10.3)
Calcium: 8.3 mg/dL — ABNORMAL LOW (ref 8.9–10.3)
Chloride: 104 mmol/L (ref 98–111)
Chloride: 102 mmol/L (ref 98–111)
Creatinine, Ser: 1.72 mg/dL — ABNORMAL HIGH (ref 0.61–1.24)
Creatinine, Ser: 1.73 mg/dL — ABNORMAL HIGH (ref 0.61–1.24)
GFR, Estimated: 41 mL/min — ABNORMAL LOW (ref >60)
GFR, Estimated: 41 mL/min — ABNORMAL LOW (ref >60)
Glucose, Bld: 156 mg/dL — ABNORMAL HIGH (ref 70–99)
Glucose, Bld: 203 mg/dL — ABNORMAL HIGH (ref 70–99)
Potassium: 3.1 mmol/L — ABNORMAL LOW (ref 3.5–5.1)
Potassium: 3.9 mmol/L (ref 3.5–5.1)
Sodium: 142 mmol/L (ref 135–145)
Sodium: 137 mmol/L (ref 135–145)
Total Bilirubin: 0.7 mg/dL (ref 0.3–1.2)
Total Bilirubin: 0.6 mg/dL (ref 0.3–1.2)
Total Protein: 5.6 g/dL — ABNORMAL LOW (ref 6.5–8.1)
Total Protein: 6.5 g/dL (ref 6.5–8.1)

## 2022-05-06 LAB — GLUCOSE, CAPILLARY
Glucose-Capillary: 175 mg/dL — ABNORMAL HIGH (ref 70–99)
Glucose-Capillary: 185 mg/dL — ABNORMAL HIGH (ref 70–99)
Glucose-Capillary: 198 mg/dL — ABNORMAL HIGH (ref 70–99)
Glucose-Capillary: 241 mg/dL — ABNORMAL HIGH (ref 70–99)
Glucose-Capillary: 321 mg/dL — ABNORMAL HIGH (ref 70–99)
Glucose-Capillary: 403 mg/dL — ABNORMAL HIGH (ref 70–99)

## 2022-05-06 LAB — HEMOGLOBIN A1C
Hgb A1c MFr Bld: 10.6 % — ABNORMAL HIGH (ref 4.8–5.6)
Mean Plasma Glucose: 257.52 mg/dL

## 2022-05-06 LAB — CBC
HCT: 41.5 % (ref 39.0–52.0)
Hemoglobin: 13.4 g/dL (ref 13.0–17.0)
MCH: 29.6 pg (ref 26.0–34.0)
MCHC: 32.3 g/dL (ref 30.0–36.0)
MCV: 91.6 fL (ref 80.0–100.0)
Platelets: 233 10*3/uL (ref 150–400)
RBC: 4.53 MIL/uL (ref 4.22–5.81)
RDW: 16 % — ABNORMAL HIGH (ref 11.5–15.5)
WBC: 7 10*3/uL (ref 4.0–10.5)
nRBC: 0 % (ref 0.0–0.2)

## 2022-05-06 LAB — RESP PANEL BY RT-PCR (RSV, FLU A&B, COVID)  RVPGX2
Influenza A by PCR: POSITIVE — AB
Influenza B by PCR: NEGATIVE
Resp Syncytial Virus by PCR: NEGATIVE
SARS Coronavirus 2 by RT PCR: NEGATIVE

## 2022-05-06 LAB — MRSA NEXT GEN BY PCR, NASAL: MRSA by PCR Next Gen: NOT DETECTED

## 2022-05-06 LAB — LACTIC ACID, PLASMA: Lactic Acid, Venous: 1 mmol/L (ref 0.5–1.9)

## 2022-05-06 LAB — MAGNESIUM
Magnesium: 1.7 mg/dL (ref 1.7–2.4)
Magnesium: 2.1 mg/dL (ref 1.7–2.4)

## 2022-05-06 LAB — BRAIN NATRIURETIC PEPTIDE: B Natriuretic Peptide: 955.5 pg/mL — ABNORMAL HIGH (ref 0.0–100.0)

## 2022-05-06 LAB — PHOSPHORUS: Phosphorus: 4.9 mg/dL — ABNORMAL HIGH (ref 2.5–4.6)

## 2022-05-06 MED ORDER — OSELTAMIVIR PHOSPHATE 30 MG PO CAPS
30.0000 mg | ORAL_CAPSULE | Freq: Two times a day (BID) | ORAL | Status: AC
  Administered 2022-05-06 – 2022-05-10 (×10): 30 mg via ORAL
  Filled 2022-05-06 (×11): qty 1

## 2022-05-06 MED ORDER — POTASSIUM CHLORIDE 10 MEQ/100ML IV SOLN
10.0000 meq | INTRAVENOUS | Status: AC
  Administered 2022-05-06 (×4): 10 meq via INTRAVENOUS
  Filled 2022-05-06 (×4): qty 100

## 2022-05-06 MED ORDER — POTASSIUM CHLORIDE 20 MEQ PO PACK
20.0000 meq | PACK | ORAL | Status: AC
  Administered 2022-05-06 (×2): 20 meq via ORAL
  Filled 2022-05-06 (×2): qty 1

## 2022-05-06 MED ORDER — AMIODARONE HCL IN DEXTROSE 360-4.14 MG/200ML-% IV SOLN
30.0000 mg/h | INTRAVENOUS | Status: DC
  Administered 2022-05-07 – 2022-05-09 (×4): 30 mg/h via INTRAVENOUS
  Filled 2022-05-06 (×6): qty 200

## 2022-05-06 MED ORDER — AMIODARONE HCL IN DEXTROSE 360-4.14 MG/200ML-% IV SOLN
60.0000 mg/h | INTRAVENOUS | Status: AC
  Administered 2022-05-06 (×2): 60 mg/h via INTRAVENOUS
  Filled 2022-05-06: qty 200

## 2022-05-06 MED ORDER — AMIODARONE LOAD VIA INFUSION
150.0000 mg | Freq: Once | INTRAVENOUS | Status: AC
  Administered 2022-05-06: 150 mg via INTRAVENOUS
  Filled 2022-05-06: qty 83.34

## 2022-05-06 MED ORDER — ACETAMINOPHEN 325 MG PO TABS
650.0000 mg | ORAL_TABLET | Freq: Four times a day (QID) | ORAL | Status: DC | PRN
  Administered 2022-05-06: 650 mg via ORAL
  Filled 2022-05-06: qty 2

## 2022-05-06 MED ORDER — ACETAMINOPHEN 325 MG PO TABS
ORAL_TABLET | ORAL | Status: AC
  Administered 2022-05-06: 650 mg via ORAL
  Filled 2022-05-06: qty 2

## 2022-05-06 MED ORDER — FUROSEMIDE 10 MG/ML IJ SOLN
80.0000 mg | Freq: Two times a day (BID) | INTRAMUSCULAR | Status: DC
  Administered 2022-05-06 – 2022-05-10 (×9): 80 mg via INTRAVENOUS
  Filled 2022-05-06 (×9): qty 8

## 2022-05-06 MED ORDER — INSULIN GLARGINE-YFGN 100 UNIT/ML ~~LOC~~ SOLN
15.0000 [IU] | Freq: Every day | SUBCUTANEOUS | Status: DC
  Administered 2022-05-06 – 2022-05-07 (×2): 15 [IU] via SUBCUTANEOUS
  Filled 2022-05-06 (×3): qty 0.15

## 2022-05-06 MED ORDER — INSULIN ASPART 100 UNIT/ML IJ SOLN
0.0000 [IU] | INTRAMUSCULAR | Status: DC
  Administered 2022-05-06: 15 [IU] via SUBCUTANEOUS
  Administered 2022-05-06: 11 [IU] via SUBCUTANEOUS
  Administered 2022-05-07: 5 [IU] via SUBCUTANEOUS
  Administered 2022-05-07: 8 [IU] via SUBCUTANEOUS
  Administered 2022-05-07: 11 [IU] via SUBCUTANEOUS
  Administered 2022-05-07: 2 [IU] via SUBCUTANEOUS
  Administered 2022-05-08 (×2): 3 [IU] via SUBCUTANEOUS
  Administered 2022-05-08: 5 [IU] via SUBCUTANEOUS
  Administered 2022-05-08: 3 [IU] via SUBCUTANEOUS
  Administered 2022-05-08: 8 [IU] via SUBCUTANEOUS
  Administered 2022-05-08: 5 [IU] via SUBCUTANEOUS
  Administered 2022-05-09: 2 [IU] via SUBCUTANEOUS
  Administered 2022-05-09 (×3): 5 [IU] via SUBCUTANEOUS
  Administered 2022-05-10: 2 [IU] via SUBCUTANEOUS

## 2022-05-06 MED ORDER — MAGNESIUM SULFATE 2 GM/50ML IV SOLN
2.0000 g | Freq: Once | INTRAVENOUS | Status: AC
  Administered 2022-05-06: 2 g via INTRAVENOUS
  Filled 2022-05-06: qty 50

## 2022-05-06 NOTE — Consult Note (Signed)
Cardiology Consultation   Patient ID: Jerry Myers MRN: [id number]; DOB: Jun 30, 1950  Admit date: 05/05/2022 Date of Consult: 05/06/2022  PCP:  Patient, No Pcp Per   [place] Providers Cardiologist:  None        Patient Profile:   Jerry Myers is a 72 y.o. male with a hx of CAD s/p CABGx4 in 2020 (LIMA-LAD, SVG-OM, SVG-PDA and SVG-PL), DM, HTN, HLD who is being seen 05/06/2022 for the evaluation of acute decompensated heart failure at the request of the Emergency Department.  History of Present Illness:   Jerry Myers was discharged on 01/13 from [facility] per patient request. He had been admitted because of acute decompensated heart failure. His echocardiogram showed LVEF of 20% and ECG was concerning for atrial flutter with RVR.  At home he was significantly short of breath, family called EMS and he was brought to the ER. His vitals since arrival have been relevant for a Tmax of 101.5, BP 120/85, HR 115, RR 32 on 2L of [place]. He was initially placed on BiPAP and he requested it to be taken off.  Labs relevant of BNP of 1029, trop of 93 -> 83, wbc 5.6, Hb 13.2, plt 271, Cr 1.7 (from 1.4 prior to discharge from [place]), lactate of 2.9, negative resp panel, Bcx collected. CXR: cardiomegaly, increased interstitial markings and R sided pleural effusion. Atrial flutter with variable block.  Past Medical History:  Diagnosis Date   Abnormal nuclear cardiac imaging test 06/03/2018   Anxiety    Arthritis    Atrial flutter by electrocardiogram ([place]) 09/09/2018   Bradycardia 03/18/2018   CAD (coronary artery disease) 01/26/2019   Cardiomyopathy ([place]) 04/19/2015   Ejection fraction 4045% in the fall of 2016   Cataract    right eye   Chest pain 06/05/2018   Coronary artery disease    Coronary artery disease of native artery of native heart with stable angina pectoris ([place]) 03/18/2015   Depression    Dyspnea    Essential hypertension 12/29/2014   GERD  (gastroesophageal reflux disease)    History of kidney stones    20 yrs. 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    Past Surgical History:  Procedure Laterality Date   CARDIAC CATHETERIZATION     CORONARY ARTERY BYPASS GRAFT N/A 09/05/2018   Procedure: CORONARY ARTERY BYPASS GRAFTING (CABG) x4, ON PUMP, USING LEFT INTERNAL MAMMARY ARTERY AND RIGHT AND LEFT GREAT SAPHENOUS VEIN HARVESTED ENDOSCOPICALLY;  Surgeon: Ivin Poot, MD;  Location: [place];  Service: Open Heart Surgery;  Laterality: N/A;   FOOT SURGERY     LEFT HEART CATH AND CORONARY ANGIOGRAPHY N/A 06/05/2018   Procedure: LEFT HEART CATH AND CORONARY ANGIOGRAPHY;  Surgeon: Nelva Bush, MD;  Location: [place] [facility];  Service: Cardiovascular;  Laterality: N/A;   LUNG SURGERY     TEE WITHOUT CARDIOVERSION N/A 09/05/2018   Procedure: TRANSESOPHAGEAL ECHOCARDIOGRAM (TEE);  Surgeon: Prescott Gum, Collier Salina, MD;  Location: [facility];  Service: Open Heart Surgery;  Laterality: N/A;     Home Medications:  Prior to Admission medications   Medication Sig Start Date End Date Taking? Authorizing Provider  aspirin EC 81 MG tablet Take 81 mg by mouth daily.    [provider]  atorvastatin (LIPITOR) 20 MG tablet Take 20 mg by mouth daily.  [provider]  carvedilol (COREG) 3.125 MG tablet Take 1 tablet (3.125 mg total) by mouth 2 (two) times daily with a meal. 09/12/18   Gold, Glenice Laine, PA-C  clopidogrel (PLAVIX) 75 MG tablet TAKE 1 TABLET BY MOUTH ONCE DAILY 07/22/19   Revankar, Aundra Dubin, MD  escitalopram (LEXAPRO) 5 MG tablet Take 5 mg by mouth daily. 07/23/18   [provider]  gabapentin (NEURONTIN) 300 MG capsule Take 300 mg by mouth 2 (two) times daily.    [provider]  glimepiride (AMARYL) 2 MG tablet Take 2 mg by mouth daily with breakfast.      [provider]  glipiZIDE (GLUCOTROL XL) 5 MG 24 hr tablet Take 5 mg by mouth daily with breakfast.    [provider]  hydrochlorothiazide (HYDRODIURIL) 25 MG tablet Take 1 tablet (25 mg total) by mouth daily. 02/12/19   Revankar, Aundra Dubin, MD  lisinopril (ZESTRIL) 40 MG tablet Take 40 mg by mouth daily.    [provider]  meloxicam (MOBIC) 7.5 MG tablet Take 7.5 mg by mouth daily.    [provider]  metFORMIN (GLUCOPHAGE) 1000 MG tablet Take 1,000 mg by mouth 2 (two) times daily. 12/30/19   [provider]  nitroGLYCERIN (NITROSTAT) 0.4 MG SL tablet Place 1 tablet (0.4 mg total) under the tongue every 5 (five) minutes as needed for chest pain. Patient needs appointment for further refills. 1 st attempt 01/29/22   Revankar, Aundra Dubin, MD  nystatin cream (MYCOSTATIN) Apply 1 Application topically daily as needed for dry skin. 09/11/19   [provider]  sitaGLIPtin (JANUVIA) 100 MG tablet Take 100 mg by mouth daily.    [provider]    Inpatient Medications: Scheduled Meds:  acetaminophen  650 mg Rectal Once   apixaban  5 mg Oral BID   Chlorhexidine Gluconate Cloth  6 each Topical Daily   insulin aspart  0-9 Units Subcutaneous Q4H   Continuous Infusions:  sodium chloride     norepinephrine (LEVOPHED) Adult infusion     [START ON 05/07/2022] vancomycin     vancomycin 2,000 mg (05/06/22 0002)   PRN Meds: acetaminophen  Allergies:    Allergies  Allergen Reactions   Trulicity [Dulaglutide] Nausea And Vomiting    Social History:   Social History   Socioeconomic History   Marital status: Widowed    Spouse name: Not on file   Number of children: Not on file   Years of education: Not on file   Highest education level: Not on file  Occupational History   Not on file  Tobacco Use   Smoking status: Never   Smokeless tobacco: Never  Vaping Use   Vaping Use: Never used  Substance and Sexual Activity   Alcohol use: Never    Drug use: Not Currently   Sexual activity: Not on file  Other Topics Concern   Not on file  Social History Narrative   Not on file   Social Determinants of Health   Financial Resource Strain: Not on file  Food Insecurity: Not on file  Transportation Needs: Not on file  Physical Activity: Not on file  Stress: Not on file  Social Connections: Not on file  Intimate Partner Violence: Not on file    Family History:    Family History  Problem Relation Age of Onset   Diabetes Mother    Other Mother    Arrhythmia Mother    Diabetes Father    Hypertension Father  Diabetes Sister    Heart attack Brother    Arrhythmia Brother    Hypertension Brother    Congestive Heart Failure Brother    Kidney disease Brother    Diabetes Sister      ROS:  Please see the history of present illness.   All other ROS reviewed and negative.     Physical Exam/Data:   Vitals:   05/05/22 1745 05/05/22 1800 05/05/22 1820 05/05/22 1821  BP: 101/74 101/86    Pulse: (!) 50 (!) 116    Resp: (!) 26 (!) 27    Temp:  (!) 100.9 F (38.3 C)    TempSrc:      SpO2: 99% 95%  95%  Weight:   102.1 kg   Height:   5\' 8"  (1.727 m)     Intake/Output Summary (Last 24 hours) at 05/06/2022 0022 Last data filed at 05/06/2022 0016 Gross per 24 hour  Intake --  Output 2200 ml  Net -2200 ml      05/05/2022    6:20 PM 02/18/2020    3:24 PM 12/31/2019   10:17 AM  Last 3 Weights  Weight (lbs) 225 lb 212 lb 9.6 oz 208 lb 12.8 oz  Weight (kg) 102.059 kg 96.435 kg 94.711 kg     Body mass index is 34.21 kg/m.  General:  Well nourished, well developed, in no acute distress HEENT: normal Neck: JVP +5 cm H2O Vascular: No carotid bruits; Distal pulses 2+ bilaterally Cardiac:  tachycardic, no murmur.  Lungs:  Decreased breath sounds at bases but otherwise clear. Abd: soft, nontender, no hepatomegaly  Ext: no edema Musculoskeletal:  No deformities, BUE and BLE strength normal and equal Skin: warm . Neuro:   CNs 2-12 intact, no focal abnormalities noted Psych:  Normal affect   EKG:  The EKG was personally reviewed and demonstrates:  atrial flutter Telemetry:  Telemetry was personally reviewed and demonstrates:  atria flitter  Relevant CV Studies:  Laboratory Data:  High Sensitivity Troponin:   Recent Labs  Lab 05/05/22 1635 05/05/22 2110  TROPONINIHS 93* 83*     Chemistry Recent Labs  Lab 05/05/22 1635 05/05/22 2119  NA 140 141  K 3.6 3.4*  CL 102  --   CO2 24  --   GLUCOSE 189*  --   BUN 27*  --   CREATININE 1.70*  --   CALCIUM 8.9  --   GFRNONAA 42*  --   ANIONGAP 14  --     Recent Labs  Lab 05/05/22 1635  PROT 6.4*  ALBUMIN 3.5  AST 25  ALT 19  ALKPHOS 70  BILITOT 1.0   Lipids No results for input(s): "CHOL", "TRIG", "HDL", "LABVLDL", "LDLCALC", "CHOLHDL" in the last 168 hours.  Hematology Recent Labs  Lab 05/05/22 1635 05/05/22 2119  WBC 5.6  --   RBC 4.48  --   HGB 13.2 13.6  HCT 40.9 40.0  MCV 91.3  --   MCH 29.5  --   MCHC 32.3  --   RDW 16.2*  --   PLT 271  --    Thyroid No results for input(s): "TSH", "FREET4" in the last 168 hours.  BNP Recent Labs  Lab 05/05/22 1635  BNP 1,029.1*    DDimer No results for input(s): "DDIMER" in the last 168 hours.   Radiology/Studies:  DG Chest Portable 1 View  Result Date: 05/05/2022 CLINICAL DATA:  SOB hypotension EXAM: PORTABLE CHEST 1 VIEW COMPARISON:  January tenth and 13, 2024 FINDINGS:  The cardiomediastinal silhouette is unchanged and enlarged in contour.Status post median sternotomy and CABG. Tortuous thoracic aorta. Atherosclerotic calcifications. There is a moderate RIGHT pleural effusion, similar in comparison to prior. No pneumothorax. Persistent bibasilar homogeneous opacities. IMPRESSION: Similar appearance of moderate RIGHT pleural effusion and favored bibasilar atelectasis. Electronically Signed   By: Meda Klinefelter M.D.   On: 05/05/2022 17:33     Assessment and Plan:   72 y.o. male  with a hx of CAD s/p CABGx4 in 2020 (LIMA-LAD, SVG-OM, SVG-PDA and SVG-PL), DM, HTN, HLD admitted with acute respiratory failure, acute decompensated heart failure, sepsis and AKI.  #Acute hypoxic respiratory failure #Acute on chronic systolic heart failure - Etiology of heart failure: ischemic cardiomyopathy - Hemodynamic profile warm and wet - Baseline NYHA III. Currently SCAI stage B shock - Preload: Received two doses of 40 IV lasix in the ED. Will start lasix 80 IV BID. Urine output goal at least 100-150 mL/hr. If suboptimal, will adjust lasix dosage. - Afterload: Holding off on RAAS inhibitors given AKI. - Trend lactate. If uptrending we may need to consider DBA (although not ideal given tachyarrhythmia) or MCS depending on hemodynamic profile. If this is the case, he will need SG catheter. - Strict I's and O's. Monitor renal function, keep K>4 and Mg >2.  - Telemetry monitoring  #Atrial flutter with RVR #On anticoagulation with eliquis - Chadsvasc of 5 - If not able to be rate controlled he may need TEE CV prior to dc - Eventually will need EP for both flutter and device therapy for HF  #Coronary artery disease #S/p CABG x4 (LIMA-LAD, SVG-OM, SVG-PDA, SVG-PL) #Non MI elevated troponins in the setting of acute heart failure - Continue statin - Holding betablocker given stage B shock  #Fever #Influenza A infection - Oseltamivir per ICU team - UA pending. Final culture results pending  #Acute on chronic kidney injury - Likely cardiorenal type 2    Risk Assessment/Risk Scores:        New York Heart Association (NYHA) Functional Class NYHA Class III        For questions or updates, please contact [facility] Please consult [email or web address] for contact info under    Signed, Regan Rakers, MD  05/06/2022 12:22 AM

## 2022-05-06 NOTE — Progress Notes (Addendum)
eLink Physician-Brief Progress Note Patient Name: Jerry Myers DOB: 05/21/1950 MRN: [id number]   Date of Service  05/06/2022  HPI/Events of Note  72 yr old male with hx of CAD with prior cabg, combined chf, and atrial arrythmia discharged from [facility] 1/13 after hospitalization for chf presented same day to [facility] ER for SOB, fever, hypotension, and edema.  Patient felt to have CHF.  Cardiology consulted.  Empiric abx started.  Bipap used to help resp status.  Patient admitted to ICU for further eval.  eICU Interventions  Chart reviewed     Intervention Category Evaluation Type: New Patient Evaluation  Mauri [place], P 05/06/2022, 12:19 AM

## 2022-05-06 NOTE — Progress Notes (Signed)
[place] Progress Note Patient Name: Jerry Myers DOB: 14-Apr-1951 MRN: [id number]   Date of Service  05/06/2022  HPI/Events of Note  CBG 403, SSI only goes up to 400 Tolerating diet Take Lantus 20 units at home Creatinine 1.73  eICU Interventions  To be given regular insulin 15 units now as per SSI Start Levemir 15 units once daily. Bedside rounding team to adjust accordingly     Intervention Category Intermediate Interventions: Hyperglycemia - evaluation and treatment  Shona Needles Cheri Ayotte 05/06/2022, 8:35 PM

## 2022-05-06 NOTE — Evaluation (Signed)
Occupational Therapy Evaluation Patient Details Name: Jerry Myers MRN: [id number] DOB: 1950/09/22 Today's Date: 05/06/2022   History of Present Illness Pt is a 72 y.o. M who presents 05/05/2022 for evaluation of acute on chronic systolic heart failure and influenza A. Significant PMH: CAD s/p CABG x 4, DM, HTN, HLD.   Clinical Impression   Jerry Myers was evaluated s/p the above admission list, he reports being generally mod I at baseline with 1 fall. Upon evaluation pt was limited by poor activity tolerance, unsteady balance, shuffling gait, impaired cognation and generalized weakness. Overall he needed min A for transfers and mobility with cues fro management of RW. Pt demonstrated poor safety awareness and problem solving. Recommend d/c to SNF for safety as pt is a high fall risk and lives alone; however, pt verbalized wanting to d/c home. He will need to demonstrate mod I mobility and ADLs, education provided. OT to follow acutely.      Recommendations for follow up therapy are one component of a multi-disciplinary discharge planning process, led by the attending physician.  Recommendations may be updated based on patient status, additional functional criteria and insurance authorization.   Follow Up Recommendations  Skilled nursing-short term rehab (<3 hours/day) (pt currently declining despite education)     Assistance Recommended at Discharge Frequent or constant Supervision/Assistance  Patient can return home with the following A little help with walking and/or transfers;A lot of help with bathing/dressing/bathroom;Assistance with cooking/housework;Assist for transportation;Help with stairs or ramp for entrance    Functional Status Assessment  Patient has had a recent decline in their functional status and demonstrates the ability to make significant improvements in function in a reasonable and predictable amount of time.  Equipment Recommendations  BSC/3in1;Other (comment) (RW)     Recommendations for Other Services       Precautions / Restrictions Precautions Precautions: Fall Restrictions Weight Bearing Restrictions: No      Mobility Bed Mobility Overal bed mobility: Needs Assistance             General bed mobility comments: OOB upon arrival    Transfers Overall transfer level: Needs assistance Equipment used: Rolling walker (2 wheels) Transfers: Sit to/from Stand Sit to Stand: Min assist                  Balance Overall balance assessment: Needs assistance Sitting-balance support: Feet supported Sitting balance-Leahy Scale: Fair     Standing balance support: Bilateral upper extremity supported Standing balance-Leahy Scale: Poor                             ADL either performed or assessed with clinical judgement   ADL Overall ADL's : Needs assistance/impaired Eating/Feeding: Independent;Sitting   Grooming: Set up;Sitting   Upper Body Bathing: Set up;Sitting   Lower Body Bathing: Moderate assistance;Sit to/from stand   Upper Body Dressing : Set up;Sitting   Lower Body Dressing: Moderate assistance;Sit to/from stand   Toilet Transfer: Minimal assistance;Ambulation;Rolling walker (2 wheels)   Toileting- Clothing Manipulation and Hygiene: Supervision/safety;Sitting/lateral lean       Functional mobility during ADLs: Minimal assistance;Cueing for safety;Cueing for sequencing;Rolling walker (2 wheels) General ADL Comments: significantly increased time and cues needed, poor activity tolerance     Vision Baseline Vision/History: 1 Wears glasses Vision Assessment?: No apparent visual deficits     Perception Perception Perception Tested?: No   Praxis Praxis Praxis tested?: Not tested    Pertinent Vitals/Pain Pain Assessment Pain Assessment:  No/denies pain     Hand Dominance Right   Extremity/Trunk Assessment Upper Extremity Assessment Upper Extremity Assessment: Generalized weakness   Lower  Extremity Assessment Lower Extremity Assessment: Generalized weakness   Cervical / Trunk Assessment Cervical / Trunk Assessment: Kyphotic   Communication Communication Communication: No difficulties   Cognition Arousal/Alertness: Awake/alert Behavior During Therapy: Flat affect Overall Cognitive Status: No family/caregiver present to determine baseline cognitive functioning                                 General Comments: required simple 1 step commands, poor problem solving and safety awareness. Poor insight and self monitoring     General Comments  VSS on RA, increased SOB with mobility, returned 2L at the end of the session. Had extensive discussion about safety concerns, fall risk and self care at d/c; pt continued to decline follow up therapy    Exercises     Shoulder Instructions      Home Living Family/patient expects to be discharged to:: Private residence Living Arrangements: Alone Available Help at Discharge: Family Type of Home: Mobile home Home Access: Ramped entrance     Home Layout: One level     Bathroom Shower/Tub: Tub/shower unit         Home Equipment: Kasandra Knudsen - single point;Shower seat          Prior Functioning/Environment Prior Level of Function : Independent/Modified Independent;Driving             Mobility Comments: one fall within past 6 months, using cane ADLs Comments: has aide who assists with cooking/cleaning        OT Problem List: Decreased strength;Decreased range of motion;Decreased activity tolerance;Impaired balance (sitting and/or standing);Decreased knowledge of use of DME or AE;Decreased safety awareness;Decreased knowledge of precautions      OT Treatment/Interventions: Self-care/ADL training;Therapeutic exercise;DME and/or AE instruction;Therapeutic activities;Patient/family education;Balance training    OT Goals(Current goals can be found in the care plan section) Acute Rehab OT Goals Patient Stated  Goal: home OT Goal Formulation: With patient Time For Goal Achievement: 05/20/22 Potential to Achieve Goals: Good ADL Goals Pt Will Perform Grooming: with modified independence;standing Pt Will Perform Lower Body Dressing: with modified independence;sit to/from stand Pt Will Transfer to Toilet: with modified independence;ambulating Additional ADL Goal #1: Pt will indep complete IADL medicaiton management task  OT Frequency: Min 2X/week    Co-evaluation              AM-PAC OT "6 Clicks" Daily Activity     Outcome Measure Help from another person eating meals?: A Little Help from another person taking care of personal grooming?: A Little Help from another person toileting, which includes using toliet, bedpan, or urinal?: A Little Help from another person bathing (including washing, rinsing, drying)?: A Lot Help from another person to put on and taking off regular upper body clothing?: A Little Help from another person to put on and taking off regular lower body clothing?: A Lot 6 Click Score: 16   End of Session Equipment Utilized During Treatment: Gait belt;Rolling walker (2 wheels) Nurse Communication: Mobility status  Activity Tolerance: Patient tolerated treatment well Patient left: in chair;with call bell/phone within reach  OT Visit Diagnosis: Unsteadiness on feet (R26.81);Other abnormalities of gait and mobility (R26.89);Muscle weakness (generalized) (M62.81);History of falling (Z91.81)                Time: 7322-0254 OT Time Calculation (min): 16  min Charges:  OT General Charges $OT Visit: 1 Visit OT Evaluation $OT Eval Moderate Complexity: 1 Mod    Farhiya Rosten D Causey 05/06/2022, 3:41 PM

## 2022-05-06 NOTE — Progress Notes (Signed)
DAILY PROGRESS NOTE   Patient Name: Jerry Myers Date of Encounter: 05/06/2022 Cardiologist: None  Chief Complaint   Breathing is better  Patient Profile   Jerry Myers is a 72 y.o. male with a hx of CAD s/p CABGx4 in 2020 (LIMA-LAD, SVG-OM, SVG-PDA and SVG-PL), DM, HTN, HLD who is being seen 05/06/2022 for the evaluation of acute decompensated heart failure at the request of the Emergency Department.   Subjective   Diuresed 2.2L negative. Remains in afib with RVR in the 120s. He is Influenza A Positive. Hypotensive this am - tMax 101.5. Creatinine stable at 1.72 with diuresis. Hypokalemic today at 3.1, magnesium 1.7.  Objective   Vitals:   05/06/22 1045 05/06/22 1100 05/06/22 1115 05/06/22 1130  BP:  (!) 118/98    Pulse: (!) 114 (!) 114 (!) 116   Resp: (!) 27 (!) 29 (!) 29   Temp:    97.8 F (36.6 C)  TempSrc:    Oral  SpO2: 99% 99% 96%   Weight:      Height:        Intake/Output Summary (Last 24 hours) at 05/06/2022 1236 Last data filed at 05/06/2022 1131 Gross per 24 hour  Intake 1146.55 ml  Output 3750 ml  Net -2603.45 ml   Filed Weights   05/05/22 1820 05/06/22 0015  Weight: 102.1 kg 88.3 kg    Physical Exam   General appearance: alert, no distress, and sitting up in a chair Neck: JVD - 5 cm above sternal notch, no carotid bruit, and thyroid not enlarged, symmetric, no tenderness/mass/nodules Lungs: diminished breath sounds bibasilar and wheezes bilaterally Heart: irregularly irregular, tachycardic Abdomen: soft, non-tender; bowel sounds normal; no masses,  no organomegaly Extremities: extremities normal, atraumatic, no cyanosis or edema Pulses: 2+ and symmetric Skin: Skin color, texture, turgor normal. No rashes or lesions Neurologic: Grossly normal Psych: Pleasant  Inpatient Medications    Scheduled Meds:  acetaminophen  650 mg Rectal Once   apixaban  5 mg Oral BID   Chlorhexidine Gluconate Cloth  6 each Topical Daily   furosemide   80 mg Intravenous BID   insulin aspart  0-9 Units Subcutaneous Q4H   oseltamivir  30 mg Oral BID    Continuous Infusions:  sodium chloride     norepinephrine (LEVOPHED) Adult infusion 2 mcg/min (05/06/22 0841)    PRN Meds: acetaminophen   Labs   Results for orders placed or performed during the hospital encounter of 05/05/22 (from the past 48 hour(s))  Brain natriuretic peptide     Status: Abnormal   Collection Time: 05/05/22  4:35 PM  Result Value Ref Range   B Natriuretic Peptide 1,029.1 (H) 0.0 - 100.0 pg/mL    Comment: Performed at [facility], [address], [place], [place] [address]  Troponin I (High Sensitivity)     Status: Abnormal   Collection Time: 05/05/22  4:35 PM  Result Value Ref Range   Troponin I (High Sensitivity) 93 (H) <18 ng/L    Comment: (NOTE) Elevated high sensitivity troponin I (hsTnI) values and significant  changes across serial measurements may suggest ACS but many other  chronic and acute conditions are known to elevate hsTnI results.  Refer to the "Links" section for chest pain algorithms and additional  guidance. Performed at [facility], [place] [address] [place],  [address]   CBC with Differential     Status: Abnormal   Collection Time: 05/05/22  4:35 PM  Result Value  Ref Range   WBC 5.6 4.0 - 10.5 K/uL   RBC 4.48 4.22 - 5.81 MIL/uL   Hemoglobin 13.2 13.0 - 17.0 g/dL   HCT 18.2 99.3 - 71.6 %   MCV 91.3 80.0 - 100.0 fL   MCH 29.5 26.0 - 34.0 pg   MCHC 32.3 30.0 - 36.0 g/dL   RDW 96.7 (H) 89.3 - 81.0 %   Platelets 271 150 - 400 K/uL   nRBC 0.0 0.0 - 0.2 %   Neutrophils Relative % 70 %   Neutro Abs 3.9 1.7 - 7.7 K/uL   Lymphocytes Relative 11 %   Lymphs Abs 0.6 (L) 0.7 - 4.0 K/uL   Monocytes Relative 19 %   Monocytes Absolute 1.1 (H) 0.1 - 1.0 K/uL   Eosinophils Relative 0 %   Eosinophils Absolute 0.0 0.0 - 0.5 K/uL   Basophils Relative 0 %   Basophils Absolute 0.0 0.0 - 0.1 K/uL   Immature Granulocytes 0 %    Abs Immature Granulocytes 0.01 0.00 - 0.07 K/uL    Comment: Performed at [facility], [address], [place], [place] [address]  Comprehensive metabolic panel     Status: Abnormal   Collection Time: 05/05/22  4:35 PM  Result Value Ref Range   Sodium 140 135 - 145 mmol/L   Potassium 3.6 3.5 - 5.1 mmol/L   Chloride 102 98 - 111 mmol/L   CO2 24 22 - 32 mmol/L   Glucose, Bld 189 (H) 70 - 99 mg/dL    Comment: Glucose reference range applies only to samples taken after fasting for at least 8 hours.   BUN 27 (H) 8 - 23 mg/dL   Creatinine, Ser 2.58 (H) 0.61 - 1.24 mg/dL   Calcium 8.9 8.9 - 52.7 mg/dL   Total Protein 6.4 (L) 6.5 - 8.1 g/dL   Albumin 3.5 3.5 - 5.0 g/dL   AST 25 15 - 41 U/L   ALT 19 0 - 44 U/L   Alkaline Phosphatase 70 38 - 126 U/L   Total Bilirubin 1.0 0.3 - 1.2 mg/dL   GFR, Estimated 42 (L) >60 mL/min    Comment: (NOTE) Calculated using the CKD-EPI Creatinine Equation (2021)    Anion gap 14 5 - 15    Comment: Performed at [facility], [address] [place], [place] [address]  POC CBG, ED     Status: Abnormal   Collection Time: 05/05/22  4:40 PM  Result Value Ref Range   Glucose-Capillary 182 (H) 70 - 99 mg/dL    Comment: Glucose reference range applies only to samples taken after fasting for at least 8 hours.  Urinalysis, Routine w reflex microscopic Urine, Catheterized     Status: Abnormal   Collection Time: 05/05/22  4:42 PM  Result Value Ref Range   Color, Urine YELLOW YELLOW   APPearance CLEAR CLEAR   Specific Gravity, Urine 1.012 1.005 - 1.030   pH 5.0 5.0 - 8.0   Glucose, UA >=500 (A) NEGATIVE mg/dL   Hgb urine dipstick MODERATE (A) NEGATIVE   Bilirubin Urine NEGATIVE NEGATIVE   Ketones, ur NEGATIVE NEGATIVE mg/dL   Protein, ur NEGATIVE NEGATIVE mg/dL   Nitrite NEGATIVE NEGATIVE   Leukocytes,Ua NEGATIVE NEGATIVE   RBC / HPF >50 (H) 0 - 5 RBC/hpf   WBC, UA 0-5 0 - 5 WBC/hpf   Bacteria, UA NONE SEEN NONE SEEN   Squamous Epithelial / HPF 0-5  0 - 5 /HPF   Mucus PRESENT  Comment: Performed at [facility], [place] [address] [place], [place] [address]  Lactic acid, plasma     Status: Abnormal   Collection Time: 05/05/22  5:30 PM  Result Value Ref Range   Lactic Acid, Venous 2.9 (HH) 0.5 - 1.9 mmol/L    Comment: CRITICAL RESULT CALLED TO, READ BACK BY AND VERIFIED WITH K,YUAL RN @1935  05/05/22 E,BENTON Performed at [facility], [place] [address], [place], [place] [address]   Blood culture (routine x 2)     Status: None (Preliminary result)   Collection Time: 05/05/22  9:05 PM   Specimen: BLOOD  Result Value Ref Range   Specimen Description BLOOD RIGHT ANTECUBITAL    Special Requests      BOTTLES DRAWN AEROBIC AND ANAEROBIC Blood Culture adequate volume   Culture      NO GROWTH < 12 HOURS Performed at [facility], [place] [address] [place], [place] [address]    Report Status PENDING   Lactic acid, plasma     Status: Abnormal   Collection Time: 05/05/22  9:10 PM  Result Value Ref Range   Lactic Acid, Venous 2.5 (HH) 0.5 - 1.9 mmol/L    Comment: CRITICAL RESULT CALLED TO, READ BACK BY AND VERIFIED WITH EZELL,W RN 05/05/22 2238 AMIREHSANI F Performed at [facility], [address] [place], [place] [address]   Troponin I (High Sensitivity)     Status: Abnormal   Collection Time: 05/05/22  9:10 PM  Result Value Ref Range   Troponin I (High Sensitivity) 83 (H) <18 ng/L    Comment: (NOTE) Elevated high sensitivity troponin I (hsTnI) values and significant  changes across serial measurements may suggest ACS but many other  chronic and acute conditions are known to elevate hsTnI results.  Refer to the "Links" section for chest pain algorithms and additional  guidance. Performed at [facility], [place] [address] [place], [place] [address]   Blood culture (routine x 2)     Status: None (Preliminary result)   Collection Time: 05/05/22  9:10 PM   Specimen: BLOOD LEFT WRIST  Result Value Ref Range    Specimen Description BLOOD LEFT WRIST    Special Requests      BOTTLES DRAWN AEROBIC AND ANAEROBIC Blood Culture adequate volume   Culture      NO GROWTH < 12 HOURS Performed at [facility], [place] [address], [place], [place] [address]    Report Status PENDING   I-Stat venous blood gas, ([facility], [facility], [facility])     Status: Abnormal   Collection Time: 05/05/22  9:19 PM  Result Value Ref Range   pH, Ven 7.507 (H) 7.25 - 7.43   pCO2, Ven 28.1 (L) 44 - 60 mmHg   pO2, Ven 35 32 - 45 mmHg   Bicarbonate 22.3 20.0 - 28.0 mmol/L   TCO2 23 22 - 32 mmol/L   O2 Saturation 75 %   Acid-Base Excess 0.0 0.0 - 2.0 mmol/L   Sodium 141 135 - 145 mmol/L   Potassium 3.4 (L) 3.5 - 5.1 mmol/L   Calcium, Ion 0.99 (L) 1.15 - 1.40 mmol/L   HCT 40.0 39.0 - 52.0 %   Hemoglobin 13.6 13.0 - 17.0 g/dL   Sample type VENOUS    Comment NOTIFIED PHYSICIAN   MRSA Next Gen by PCR, Nasal     Status: None   Collection Time: 05/05/22 11:30 PM   Specimen: Anterior Nasal Swab  Result  Value Ref Range   MRSA by PCR Next Gen NOT DETECTED NOT DETECTED    Comment: (NOTE) The GeneXpert MRSA Assay (FDA approved for NASAL specimens only), is one component of a comprehensive MRSA colonization surveillance program. It is not intended to diagnose MRSA infection nor to guide or monitor treatment for MRSA infections. Test performance is not FDA approved in patients less than 58 years old. Performed at [facility], [address] [place], [place] [address]   Resp panel by RT-PCR (RSV, Flu A&B, Covid) Anterior Nasal Swab     Status: Abnormal   Collection Time: 05/05/22 11:32 PM   Specimen: Anterior Nasal Swab  Result Value Ref Range   SARS Coronavirus 2 by RT PCR NEGATIVE NEGATIVE    Comment: (NOTE) SARS-CoV-2 target nucleic acids are NOT DETECTED.  The SARS-CoV-2 RNA is generally detectable in upper respiratory specimens during the acute phase of infection. The lowest concentration of SARS-CoV-2 viral copies this  assay can detect is 138 copies/mL. A negative result does not preclude SARS-Cov-2 infection and should not be used as the sole basis for treatment or other patient management decisions. A negative result may occur with  improper specimen collection/handling, submission of specimen other than nasopharyngeal swab, presence of viral mutation(s) within the areas targeted by this assay, and inadequate number of viral copies(<138 copies/mL). A negative result must be combined with clinical observations, patient history, and epidemiological information. The expected result is Negative.  Fact Sheet for Patients:  BloggerCourse.com  Fact Sheet for Healthcare Providers:  SeriousBroker.it  This test is no t yet approved or cleared by the [place] FDA and  has been authorized for detection and/or diagnosis of SARS-CoV-2 by FDA under an Emergency Use Authorization (EUA). This EUA will remain  in effect (meaning this test can be used) for the duration of the COVID-19 declaration under Section 564(b)(1) of the Act, 21 U.S.C.section 360bbb-3(b)(1), unless the authorization is terminated  or revoked sooner.       Influenza A by PCR POSITIVE (A) NEGATIVE   Influenza B by PCR NEGATIVE NEGATIVE    Comment: (NOTE) The Xpert Xpress SARS-CoV-2/FLU/RSV plus assay is intended as an aid in the diagnosis of influenza from Nasopharyngeal swab specimens and should not be used as a sole basis for treatment. Nasal washings and aspirates are unacceptable for Xpert Xpress SARS-CoV-2/FLU/RSV testing.  Fact Sheet for Patients: BloggerCourse.com  Fact Sheet for Healthcare Providers: SeriousBroker.it  This test is not yet approved or cleared by the [place] FDA and has been authorized for detection and/or diagnosis of SARS-CoV-2 by FDA under an Emergency Use Authorization (EUA). This EUA will remain in  effect (meaning this test can be used) for the duration of the COVID-19 declaration under Section 564(b)(1) of the Act, 21 U.S.C. section 360bbb-3(b)(1), unless the authorization is terminated or revoked.     Resp Syncytial Virus by PCR NEGATIVE NEGATIVE    Comment: (NOTE) Fact Sheet for Patients: BloggerCourse.com  Fact Sheet for Healthcare Providers: SeriousBroker.it  This test is not yet approved or cleared by the [place] FDA and has been authorized for detection and/or diagnosis of SARS-CoV-2 by FDA under an Emergency Use Authorization (EUA). This EUA will remain in effect (meaning this test can be used) for the duration of the COVID-19 declaration under Section 564(b)(1) of the Act, 21 U.S.C. section 360bbb-3(b)(1), unless the authorization is terminated or revoked.  Performed at [facility], [address], [place], [place] [address]  Glucose, capillary     Status: Abnormal   Collection Time: 05/06/22 12:04 AM  Result Value Ref Range   Glucose-Capillary 198 (H) 70 - 99 mg/dL    Comment: Glucose reference range applies only to samples taken after fasting for at least 8 hours.  Hemoglobin A1c     Status: Abnormal   Collection Time: 05/06/22  2:35 AM  Result Value Ref Range   Hgb A1c MFr Bld 10.6 (H) 4.8 - 5.6 %    Comment: (NOTE) Pre diabetes:          5.7%-6.4%  Diabetes:              >6.4%  Glycemic control for   <7.0% adults with diabetes    Mean Plasma Glucose 257.52 mg/dL    Comment: Performed at [facility], [address], [place], [place] [address]  CBC with Differential/Platelet     Status: Abnormal   Collection Time: 05/06/22  2:35 AM  Result Value Ref Range   WBC 5.7 4.0 - 10.5 K/uL   RBC 3.98 (L) 4.22 - 5.81 MIL/uL   Hemoglobin 12.1 (L) 13.0 - 17.0 g/dL   HCT 72.0 (L) 94.7 - 09.6 %   MCV 89.2 80.0 - 100.0 fL   MCH 30.4 26.0 - 34.0 pg   MCHC 34.1 30.0 - 36.0 g/dL   RDW 28.3 (H)  66.2 - 15.5 %   Platelets 220 150 - 400 K/uL   nRBC 0.0 0.0 - 0.2 %   Neutrophils Relative % 74 %   Neutro Abs 4.2 1.7 - 7.7 K/uL   Lymphocytes Relative 9 %   Lymphs Abs 0.5 (L) 0.7 - 4.0 K/uL   Monocytes Relative 17 %   Monocytes Absolute 0.9 0.1 - 1.0 K/uL   Eosinophils Relative 0 %   Eosinophils Absolute 0.0 0.0 - 0.5 K/uL   Basophils Relative 0 %   Basophils Absolute 0.0 0.0 - 0.1 K/uL   Immature Granulocytes 0 %   Abs Immature Granulocytes 0.02 0.00 - 0.07 K/uL    Comment: Performed at [facility], [address] [place], [place] [address]  Comprehensive metabolic panel     Status: Abnormal   Collection Time: 05/06/22  2:35 AM  Result Value Ref Range   Sodium 142 135 - 145 mmol/L   Potassium 3.1 (L) 3.5 - 5.1 mmol/L   Chloride 104 98 - 111 mmol/L   CO2 25 22 - 32 mmol/L   Glucose, Bld 156 (H) 70 - 99 mg/dL    Comment: Glucose reference range applies only to samples taken after fasting for at least 8 hours.   BUN 29 (H) 8 - 23 mg/dL   Creatinine, Ser 4.65 (H) 0.61 - 1.24 mg/dL   Calcium 8.4 (L) 8.9 - 10.3 mg/dL   Total Protein 5.6 (L) 6.5 - 8.1 g/dL   Albumin 3.2 (L) 3.5 - 5.0 g/dL   AST 18 15 - 41 U/L   ALT 19 0 - 44 U/L   Alkaline Phosphatase 62 38 - 126 U/L   Total Bilirubin 0.7 0.3 - 1.2 mg/dL   GFR, Estimated 41 (L) >60 mL/min    Comment: (NOTE) Calculated using the CKD-EPI Creatinine Equation (2021)    Anion gap 13 5 - 15    Comment: Performed at [facility], [address] [place], [place] [address]  Magnesium     Status: None   Collection Time: 05/06/22  2:35 AM  Result Value Ref  Range   Magnesium 1.7 1.7 - 2.4 mg/dL    Comment: Performed at [facility], [address] [place], [place] [address]  Phosphorus     Status: Abnormal   Collection Time: 05/06/22  2:35 AM  Result Value Ref Range   Phosphorus 4.9 (H) 2.5 - 4.6 mg/dL    Comment: Performed at [facility], [address], [place], [place] [address]  Lactic acid, plasma      Status: None   Collection Time: 05/06/22  2:35 AM  Result Value Ref Range   Lactic Acid, Venous 1.0 0.5 - 1.9 mmol/L    Comment: Performed at [facility], [address] [place], [place] [address]  Glucose, capillary     Status: Abnormal   Collection Time: 05/06/22  2:59 AM  Result Value Ref Range   Glucose-Capillary 175 (H) 70 - 99 mg/dL    Comment: Glucose reference range applies only to samples taken after fasting for at least 8 hours.  Glucose, capillary     Status: Abnormal   Collection Time: 05/06/22 11:30 AM  Result Value Ref Range   Glucose-Capillary 185 (H) 70 - 99 mg/dL    Comment: Glucose reference range applies only to samples taken after fasting for at least 8 hours.    ECG   N/A  Telemetry   Afib with RVR - Personally Reviewed  Radiology    DG Chest Portable 1 View  Result Date: 05/05/2022 CLINICAL DATA:  SOB hypotension EXAM: PORTABLE CHEST 1 VIEW COMPARISON:  January tenth and 13, 2024 FINDINGS: The cardiomediastinal silhouette is unchanged and enlarged in contour.Status post median sternotomy and CABG. Tortuous thoracic aorta. Atherosclerotic calcifications. There is a moderate RIGHT pleural effusion, similar in comparison to prior. No pneumothorax. Persistent bibasilar homogeneous opacities. IMPRESSION: Similar appearance of moderate RIGHT pleural effusion and favored bibasilar atelectasis. Electronically Signed   By: Meda Klinefelter M.D.   On: 05/05/2022 17:33    Cardiac Studies   N/A  Assessment   Principal Problem:   Acute on chronic systolic (congestive) heart failure (HCC) Active Problems:   Acute on chronic systolic CHF (congestive heart failure) (HCC)   Plan   Breathing improved on treatment for influenza A and acute on chronic systolic heart failure. LVEF 40-45% at time of CABG in 2020. He was recently at [facility] where echo showed LVEF 20% by report, but was discharged per family request and readmitted to [facility] via EMS.  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 AFib rates are uncontrolled. Agree with continued diuresis and BP support. Would likely benefit from restoration of sinus rhythm at some point with TEE/DCCV - he was recently started on Eliquis. Given very low LVEF, will add amiodarone for additional rate control as bp will not tolerate additional agents.  He remains in guarded condition.  CRITICAL CARE TIME: I have spent a total of 45 minutes with patient reviewing hospital notes, telemetry, EKGs, labs and examining the patient as well as establishing an assessment and plan that was discussed with the patient.  > 50% of time was spent in direct patient care. The patient is critically ill with multi-organ system failure and requires high complexity decision making for assessment and support, frequent evaluation and titration of therapies, application of advanced monitoring technologies and extensive interpretation of multiple databases.   Length of Stay:  LOS: 1 day   Chrystie Nose, MD, [facility], FACP  [place]  [facility]  Medical Director of the Advanced Lipid Disorders &  Cardiovascular Risk Reduction Clinic Diplomate of the American Board of Clinical Lipidology Attending Cardiologist  Direct Dial: [phone]  Fax: [phone]  Website:  [email or web address] 05/06/2022, 12:36 PM

## 2022-05-06 NOTE — Progress Notes (Signed)
Influenza A positive.  MRSA screen negative.  Start tamiflu.  Stop vancomycin.  Chesley Mires, MD [place] Pager - [phone] 05/06/2022, 6:30 AM

## 2022-05-06 NOTE — Progress Notes (Signed)
NAME:  Jerry Myers, MRN:  [id number], DOB:  [birth year], LOS: 1 ADMISSION DATE:  05/05/2022, CONSULTATION DATE:  05/05/2022 REFERRING MD:  Dr. Nevada Crane, [facility], CHIEF COMPLAINT:  Short of breath   History of Present Illness:  72 yo male was at [facility] for CHF exacerbation and discharged home on 05/05/22 per patient request.  Family immediately called 911 and he was brought to [facility] ER.  He had dyspnea and leg swelling.  He was hypotensive and had fever.  He was started on Bipap in the ER, but patient requested to have this taken off.  Cardiology consulted by EDP.  Patient was confused and not able to provide complete medical history.  Due to respiratory distress, fever, and hypotension PCCM asked to assess for ICU admission.  Pertinent  Medical History  Anxiety, Arthritis, A flutter, CAD s/p CABG, Combined CHF, Depression, HTN, GERD, Nephrolithiasis, NSVT, DM type 2  Significant Hospital Events: Including procedures, antibiotic start and stop dates in addition to other pertinent events   1/13 admit, cardiology consulted, start Abx 1/14 started on tamiflu for flu. NE  Interim History / Subjective:  Started on NE   Objective   Blood pressure (!) 72/62, pulse (!) 112, temperature 99.5 F (37.5 C), temperature source Bladder, resp. rate (!) 28, height 5\' 8"  (1.727 m), weight 88.3 kg, SpO2 97 %.    FiO2 (%):  [40 %] 40 %   Intake/Output Summary (Last 24 hours) at 05/06/2022 1047 Last data filed at 05/06/2022 0946 Gross per 24 hour  Intake 1146.55 ml  Output 3600 ml  Net -2453.45 ml   Filed Weights   05/05/22 1820 05/06/22 0015  Weight: 102.1 kg 88.3 kg    Examination:  General - older adult M NAD  HEENT: NCAT [place] in place  Cardiac - irir cap refill brisk  Chest - symmetrical chest expansion, some rales  Abdomen - soft ndnt  Extremities - no acute joint deformity. Some BLE edema  Skin - c/d/w Neuro - AAOx3 a little lethargic   Resolved Hospital Problem list      Assessment & Plan:   Acute resp faiure w hypoxia Septic shock due to flu PNA R pleural effusion Acute on chronic systolic HF ICM Afib/flutter Hx CAD HLD  Lactic acidosis AKI on CKD 3a  Acute metabolic encephalopathy DN2 with hyperglycemia  Anemia of chronic dz  P -tamiflu  -wean O2 as able. PRN BiPAP Is, mobility  -diurese lasix 80mg  BID  -PRN CXR  -recheck BNP CMP CBC mag -cont eliquis -optimize lytes (repeat cmp ordered) -periph NE for MAP > 65  -SSI -delirium precautions  -holding home antihypertensives, holding home statin   -cardiology is following  -if stays stable from resp standpoint, gradually progress diet    Best Practice (right click and "Reselect all SmartList Selections" daily)   Diet/type: NPO DVT prophylaxis: DOAC GI prophylaxis: N/A Lines: N/A Foley:  Yes, and it is still needed Code Status:  full code Last date of multidisciplinary goals of care discussion [--]  Labs   CBC: Recent Labs  Lab 05/05/22 1635 05/05/22 2119 05/06/22 0235  WBC 5.6  --  5.7  NEUTROABS 3.9  --  4.2  HGB 13.2 13.6 12.1*  HCT 40.9 40.0 35.5*  MCV 91.3  --  89.2  PLT 271  --  409    Basic Metabolic Panel: Recent Labs  Lab 05/05/22 1635 05/05/22 2119 05/06/22 0235  NA 140 141 142  K 3.6 3.4* 3.1*  CL  102  --  104  CO2 24  --  25  GLUCOSE 189*  --  156*  BUN 27*  --  29*  CREATININE 1.70*  --  1.72*  CALCIUM 8.9  --  8.4*  MG  --   --  1.7  PHOS  --   --  4.9*   GFR: Estimated Creatinine Clearance: 41.3 mL/min (A) (by C-G formula based on SCr of 1.72 mg/dL (H)). Recent Labs  Lab 05/05/22 1635 05/05/22 1730 05/05/22 2110 05/06/22 0235  WBC 5.6  --   --  5.7  LATICACIDVEN  --  2.9* 2.5* 1.0    Liver Function Tests: Recent Labs  Lab 05/05/22 1635 05/06/22 0235  AST 25 18  ALT 19 19  ALKPHOS 70 62  BILITOT 1.0 0.7  PROT 6.4* 5.6*  ALBUMIN 3.5 3.2*   No results for input(s): "LIPASE", "AMYLASE" in the last 168 hours. No results for  input(s): "AMMONIA" in the last 168 hours.  ABG    Component Value Date/Time   PHART 7.435 09/07/2018 0440   PCO2ART 34.1 09/07/2018 0440   PO2ART 64.0 (L) 09/07/2018 0440   HCO3 22.3 05/05/2022 2119   TCO2 23 05/05/2022 2119   ACIDBASEDEF 1.0 09/07/2018 0440   O2SAT 75 05/05/2022 2119     Coagulation Profile: No results for input(s): "INR", "PROTIME" in the last 168 hours.  Cardiac Enzymes: No results for input(s): "CKTOTAL", "CKMB", "CKMBINDEX", "TROPONINI" in the last 168 hours.  HbA1C: Hgb A1c MFr Bld  Date/Time Value Ref Range Status  05/06/2022 02:35 AM 10.6 (H) 4.8 - 5.6 % Final    Comment:    (NOTE) Pre diabetes:          5.7%-6.4%  Diabetes:              >6.4%  Glycemic control for   <7.0% adults with diabetes   12/31/2019 11:05 AM 13.4 (H) 4.8 - 5.6 % Final    Comment:             Prediabetes: 5.7 - 6.4          Diabetes: >6.4          Glycemic control for adults with diabetes: <7.0     CBG: Recent Labs  Lab 05/05/22 1640 05/06/22 0004 05/06/22 0259  GLUCAP 182* 198* 175*    CRITICAL CARE Performed by: Cristal Generous   Total critical care time: 36 minutes  Critical care time was exclusive of separately billable procedures and treating other patients. Critical care was necessary to treat or prevent imminent or life-threatening deterioration.  Critical care was time spent personally by me on the following activities: development of treatment plan with patient and/or surrogate as well as nursing, discussions with consultants, evaluation of patient's response to treatment, examination of patient, obtaining history from patient or surrogate, ordering and performing treatments and interventions, ordering and review of laboratory studies, ordering and review of radiographic studies, pulse oximetry and re-evaluation of patient's condition.   Eliseo Gum MSN, AGACNP-BC [place] for pager 05/06/2022, 10:47  AM

## 2022-05-06 NOTE — Evaluation (Signed)
Physical Therapy Evaluation Patient Details Name: Jerry Myers MRN: [id number] DOB: June 16, 1950 Today's Date: 05/06/2022  History of Present Illness  Pt is a 72 y.o. M who presents 05/05/2022 for evaluation of acute on chronic systolic heart failure and influenza A. Significant PMH: CAD s/p CABG x 4, DM, HTN, HLD.  Clinical Impression  PTA, pt lives alone and is independent with mobility using a cane. Pt presents with generalized weakness, decreased activity tolerance, balance deficits, and questionable cognitive deficits (likely impacted by level of arousal). Pt very drowsy on initiation of PT evaluation; somewhat improved alertness with sitting up. Pt ambulating 75 ft with a walker at a min assist level. Presents as a high fall risk based on decreased gait speed and shuffling gait pattern. Will continue to progress mobility as tolerated.     Recommendations for follow up therapy are one component of a multi-disciplinary discharge planning process, led by the attending physician.  Recommendations may be updated based on patient status, additional functional criteria and insurance authorization.  Follow Up Recommendations Home health PT (pt declining SNF)      Assistance Recommended at Discharge Frequent or constant Supervision/Assistance  Patient can return home with the following  A little help with walking and/or transfers;A little help with bathing/dressing/bathroom;Assistance with cooking/housework;Assist for transportation;Help with stairs or ramp for entrance    Equipment Recommendations Rolling walker (2 wheels)  Recommendations for Other Services       Functional Status Assessment Patient has had a recent decline in their functional status and demonstrates the ability to make significant improvements in function in a reasonable and predictable amount of time.     Precautions / Restrictions Precautions Precautions: Fall Restrictions Weight Bearing Restrictions: No       Mobility  Bed Mobility Overal bed mobility: Needs Assistance Bed Mobility: Supine to Sit     Supine to sit: Mod assist     General bed mobility comments: Increased assist due to level of arousal    Transfers Overall transfer level: Needs assistance Equipment used: Rolling walker (2 wheels) Transfers: Sit to/from Stand Sit to Stand: Min assist           General transfer comment: MinA to rise and initially steady    Ambulation/Gait Ambulation/Gait assistance: Min assist Gait Distance (Feet): 75 Feet Assistive device: Rolling walker (2 wheels) Gait Pattern/deviations: Step-through pattern, Decreased stride length, Shuffle Gait velocity: decreased Gait velocity interpretation: <1.8 ft/sec, indicate of risk for recurrent falls   General Gait Details: Slow, shuffling pace, tendency to look down despite cues. intermittent assist for steering walker  Stairs            Wheelchair Mobility    Modified Rankin (Stroke Patients Only)       Balance Overall balance assessment: Needs assistance Sitting-balance support: Feet supported Sitting balance-Leahy Scale: Fair     Standing balance support: Bilateral upper extremity supported Standing balance-Leahy Scale: Poor Standing balance comment: reliant on RW                             Pertinent Vitals/Pain Pain Assessment Pain Assessment: No/denies pain    Home Living Family/patient expects to be discharged to:: Private residence Living Arrangements: Alone Available Help at Discharge: Family Type of Home: Mobile home Home Access: Ramped entrance       Home Layout: One level Home Equipment: Cane - single point;Shower seat      Prior Function Prior Level of Function :  Independent/Modified Independent;Driving             Mobility Comments: one fall within past 6 months, using cane ADLs Comments: has aide who assists with cooking/cleaning     Hand Dominance        Extremity/Trunk  Assessment   Upper Extremity Assessment Upper Extremity Assessment: Defer to OT evaluation    Lower Extremity Assessment Lower Extremity Assessment: Generalized weakness    Cervical / Trunk Assessment Cervical / Trunk Assessment: Kyphotic  Communication   Communication: No difficulties  Cognition Arousal/Alertness: Lethargic Behavior During Therapy: Flat affect Overall Cognitive Status: No family/caregiver present to determine baseline cognitive functioning                                 General Comments: Not oriented to day of week, very drowsy, increased alertness once sitting up. Questionable historian        General Comments      Exercises     Assessment/Plan    PT Assessment Patient needs continued PT services  PT Problem List Decreased strength;Decreased activity tolerance;Decreased balance;Decreased mobility;Decreased cognition;Decreased safety awareness       PT Treatment Interventions DME instruction;Gait training;Functional mobility training;Therapeutic activities;Therapeutic exercise;Balance training;Patient/family education    PT Goals (Current goals can be found in the Care Plan section)  Acute Rehab PT Goals Patient Stated Goal: to go home PT Goal Formulation: With patient Time For Goal Achievement: 05/20/22 Potential to Achieve Goals: Good    Frequency Min 3X/week     Co-evaluation               AM-PAC PT "6 Clicks" Mobility  Outcome Measure Help needed turning from your back to your side while in a flat bed without using bedrails?: A Little Help needed moving from lying on your back to sitting on the side of a flat bed without using bedrails?: A Lot Help needed moving to and from a bed to a chair (including a wheelchair)?: A Little Help needed standing up from a chair using your arms (e.g., wheelchair or bedside chair)?: A Little Help needed to walk in hospital room?: A Little Help needed climbing 3-5 steps with a railing?  : A Lot 6 Click Score: 16    End of Session Equipment Utilized During Treatment: Gait belt;Oxygen Activity Tolerance: Patient tolerated treatment well Patient left: in chair;with call bell/phone within reach;with chair alarm set Nurse Communication: Mobility status PT Visit Diagnosis: Unsteadiness on feet (R26.81);Muscle weakness (generalized) (M62.81);Difficulty in walking, not elsewhere classified (R26.2)    Time: 1856-3149 PT Time Calculation (min) (ACUTE ONLY): 35 min   Charges:   PT Evaluation $PT Eval Moderate Complexity: 1 Mod PT Treatments $Therapeutic Activity: 8-22 mins        Wyona Almas, PT, DPT Acute Rehabilitation Services Office [phone]   Deno Etienne 05/06/2022, 1:05 PM

## 2022-05-07 ENCOUNTER — Inpatient Hospital Stay (HOSPITAL_COMMUNITY): Payer: 59

## 2022-05-07 DIAGNOSIS — R6521 Severe sepsis with septic shock: Secondary | ICD-10-CM | POA: Diagnosis not present

## 2022-05-07 DIAGNOSIS — I483 Typical atrial flutter: Secondary | ICD-10-CM | POA: Diagnosis not present

## 2022-05-07 DIAGNOSIS — J1100 Influenza due to unidentified influenza virus with unspecified type of pneumonia: Secondary | ICD-10-CM

## 2022-05-07 DIAGNOSIS — A419 Sepsis, unspecified organism: Secondary | ICD-10-CM

## 2022-05-07 DIAGNOSIS — I5021 Acute systolic (congestive) heart failure: Secondary | ICD-10-CM

## 2022-05-07 DIAGNOSIS — I5023 Acute on chronic systolic (congestive) heart failure: Secondary | ICD-10-CM | POA: Diagnosis not present

## 2022-05-07 LAB — ECHOCARDIOGRAM COMPLETE
Area-P 1/2: 4.26 cm2
Calc EF: 25.3 %
Height: 68 in
MV M vel: 3.46 m/s
MV Peak grad: 47.9 mmHg
Radius: 0.5 cm
S' Lateral: 5.3 cm
Single Plane A2C EF: 23.3 %
Single Plane A4C EF: 23.4 %
Weight: 3114.66 oz

## 2022-05-07 LAB — GLUCOSE, CAPILLARY
Glucose-Capillary: 132 mg/dL — ABNORMAL HIGH (ref 70–99)
Glucose-Capillary: 113 mg/dL — ABNORMAL HIGH (ref 70–99)
Glucose-Capillary: 108 mg/dL — ABNORMAL HIGH (ref 70–99)
Glucose-Capillary: 315 mg/dL — ABNORMAL HIGH (ref 70–99)
Glucose-Capillary: 291 mg/dL — ABNORMAL HIGH (ref 70–99)
Glucose-Capillary: 325 mg/dL — ABNORMAL HIGH (ref 70–99)

## 2022-05-07 LAB — CBC
HCT: 36.7 % — ABNORMAL LOW (ref 39.0–52.0)
Hemoglobin: 12.5 g/dL — ABNORMAL LOW (ref 13.0–17.0)
MCH: 30.5 pg (ref 26.0–34.0)
MCHC: 34.1 g/dL (ref 30.0–36.0)
MCV: 89.5 fL (ref 80.0–100.0)
Platelets: 225 10*3/uL (ref 150–400)
RBC: 4.1 MIL/uL — ABNORMAL LOW (ref 4.22–5.81)
RDW: 15.6 % — ABNORMAL HIGH (ref 11.5–15.5)
WBC: 4.7 10*3/uL (ref 4.0–10.5)
nRBC: 0 % (ref 0.0–0.2)

## 2022-05-07 LAB — BASIC METABOLIC PANEL
Anion gap: 8 (ref 5–15)
BUN: 36 mg/dL — ABNORMAL HIGH (ref 8–23)
CO2: 26 mmol/L (ref 22–32)
Calcium: 8 mg/dL — ABNORMAL LOW (ref 8.9–10.3)
Chloride: 102 mmol/L (ref 98–111)
Creatinine, Ser: 1.59 mg/dL — ABNORMAL HIGH (ref 0.61–1.24)
GFR, Estimated: 46 mL/min — ABNORMAL LOW (ref >60)
Glucose, Bld: 93 mg/dL (ref 70–99)
Potassium: 3.1 mmol/L — ABNORMAL LOW (ref 3.5–5.1)
Sodium: 136 mmol/L (ref 135–145)

## 2022-05-07 LAB — PHOSPHORUS: Phosphorus: 3.8 mg/dL (ref 2.5–4.6)

## 2022-05-07 LAB — MAGNESIUM: Magnesium: 2.1 mg/dL (ref 1.7–2.4)

## 2022-05-07 MED ORDER — POTASSIUM CHLORIDE 10 MEQ/100ML IV SOLN
10.0000 meq | INTRAVENOUS | Status: AC
  Administered 2022-05-07 (×4): 10 meq via INTRAVENOUS
  Filled 2022-05-07 (×4): qty 100

## 2022-05-07 MED ORDER — POTASSIUM CHLORIDE 20 MEQ PO PACK
20.0000 meq | PACK | ORAL | Status: AC
  Administered 2022-05-07 (×2): 20 meq via ORAL
  Filled 2022-05-07 (×2): qty 1

## 2022-05-07 NOTE — Progress Notes (Signed)
Rounding Note    Patient Name: Jerry Myers Date of Encounter: 05/07/2022  [facility] HeartCare Cardiologist: None Dr. Debara Pickett  Subjective   Breathing better  Inpatient Medications    Scheduled Meds:  acetaminophen  650 mg Rectal Once   apixaban  5 mg Oral BID   Chlorhexidine Gluconate Cloth  6 each Topical Daily   furosemide  80 mg Intravenous BID   insulin aspart  0-15 Units Subcutaneous Q4H   insulin glargine-yfgn  15 Units Subcutaneous QHS   oseltamivir  30 mg Oral BID   Continuous Infusions:  sodium chloride     amiodarone 30 mg/hr (05/07/22 0700)   norepinephrine (LEVOPHED) Adult infusion 2 mcg/min (05/07/22 0700)   PRN Meds: acetaminophen   Vital Signs    Vitals:   05/07/22 0630 05/07/22 0645 05/07/22 0700 05/07/22 0715  BP: (!) 86/70   94/69  Pulse: (!) 105 99 94 99  Resp: 17 (!) 0 (!) 22 (!) 23  Temp:      TempSrc:      SpO2: 95% 95% 94% 95%  Weight:      Height:        Intake/Output Summary (Last 24 hours) at 05/07/2022 0801 Last data filed at 05/07/2022 0700 Gross per 24 hour  Intake 1915.16 ml  Output 2225 ml  Net -309.84 ml      05/06/2022   12:15 AM 05/05/2022    6:20 PM 02/18/2020    3:24 PM  Last 3 Weights  Weight (lbs) 194 lb 10.7 oz 225 lb 212 lb 9.6 oz  Weight (kg) 88.3 kg 102.059 kg 96.435 kg      Telemetry    Atrial flutter - Personally Reviewed  ECG    Atrial flutter with variable block  Physical Exam   GEN: No acute distress.   Neck: No JVD Cardiac: irregularly irregular, no murmurs, rubs, or gallops.  Respiratory: Clear to auscultation bilaterally. GI: Soft, nontender, non-distended  MS: No edema; No deformity. Neuro:  Nonfocal  Psych: Normal affect   Labs    High Sensitivity Troponin:   Recent Labs  Lab 05/05/22 1635 05/05/22 2110  TROPONINIHS 93* 83*     Chemistry Recent Labs  Lab 05/05/22 1635 05/05/22 2119 05/06/22 0235 05/06/22 1316  NA 140 141 142 137  K 3.6 3.4* 3.1* 3.9  CL 102  --   104 102  CO2 24  --  25 21*  GLUCOSE 189*  --  156* 203*  BUN 27*  --  29* 32*  CREATININE 1.70*  --  1.72* 1.73*  CALCIUM 8.9  --  8.4* 8.3*  MG  --   --  1.7 2.1  PROT 6.4*  --  5.6* 6.5  ALBUMIN 3.5  --  3.2* 3.4*  AST 25  --  18 30  ALT 19  --  19 25  ALKPHOS 70  --  62 68  BILITOT 1.0  --  0.7 0.6  GFRNONAA 42*  --  41* 41*  ANIONGAP 14  --  13 14    Lipids No results for input(s): "CHOL", "TRIG", "HDL", "LABVLDL", "LDLCALC", "CHOLHDL" in the last 168 hours.  Hematology Recent Labs  Lab 05/05/22 1635 05/05/22 2119 05/06/22 0235 05/06/22 1316  WBC 5.6  --  5.7 7.0  RBC 4.48  --  3.98* 4.53  HGB 13.2 13.6 12.1* 13.4  HCT 40.9 40.0 35.5* 41.5  MCV 91.3  --  89.2 91.6  MCH 29.5  --  30.4 29.6  MCHC 32.3  --  34.1 32.3  RDW 16.2*  --  16.0* 16.0*  PLT 271  --  220 233   Thyroid No results for input(s): "TSH", "FREET4" in the last 168 hours.  BNP Recent Labs  Lab 05/05/22 1635 05/06/22 1316  BNP 1,029.1* 955.5*    DDimer No results for input(s): "DDIMER" in the last 168 hours.   Radiology    DG Chest Portable 1 View  Result Date: 05/05/2022 CLINICAL DATA:  SOB hypotension EXAM: PORTABLE CHEST 1 VIEW COMPARISON:  January tenth and 13, 2024 FINDINGS: The cardiomediastinal silhouette is unchanged and enlarged in contour.Status post median sternotomy and CABG. Tortuous thoracic aorta. Atherosclerotic calcifications. There is a moderate RIGHT pleural effusion, similar in comparison to prior. No pneumothorax. Persistent bibasilar homogeneous opacities. IMPRESSION: Similar appearance of moderate RIGHT pleural effusion and favored bibasilar atelectasis. Electronically Signed   By: Valentino Saxon M.D.   On: 05/05/2022 17:33    Cardiac Studies   LVEF decreased to 20% on most recent echo report.  Patient Profile     72 y.o. male with low EF, likely combined cardiogenic and septic shock.  Known history of CAD with bypass surgery in 2020.  Atrial fibrillation with poorly  controlled heart rates at times.  Assessment & Plan    Atrial fibrillation: At some point, will need TEE cardioversion to see if restoration of atrial kick will help with overall cardiac function.  Amiodarone added for additional rate control.  Hopefully, this will help chemically convert him.  TEE/CV tentatively scheduled for Friday.   Acute on chronic systolic heart failure: Goal-directed medical therapy limited by blood pressure.  Diuresis as blood pressure allows.  Cr improving. Still on low dose levophed.  Trying to wean.   Influenza: Being treated with Tamiflu  CAD: Status post CABG.  Continue medical therapy.      For questions or updates, please contact [place] Please consult [email or web address] for contact info under        Signed, Larae Grooms, MD  05/07/2022, 8:01 AM

## 2022-05-07 NOTE — Progress Notes (Signed)
   Heart Failure Stewardship Pharmacist Progress Note   PCP: Patient, No Pcp Per PCP-Cardiologist: None    HPI:  72 yo M with PMH of CAD s/p CABG in 2020, HTN, T2DM, and HLD.   He presented to the ED on 1/13 for CHF exacerbation, weakness, and shortness of breath. He was at [facility] for 3-4 days but was wanting to leave so he was discharged. Was in respiratory distress and hypotensive, CCM consulted and was admitted to the ICU. CXR with R pleural effusion and bibasilar atelectasis. Lactate 2.9 Antibiotics started for PNA. Lactate improved to 1.0. ECHO 1/15 showed LVEF 20-25%, global hypokinesis, mild LVH, RV moderately reduced, moderate MR and TR. Remains in afib RVR. Plan TEE/DCCV on Friday pending clinical status.   Current HF Medications: Diuretic: furosemide 80 mg IV BID *also on midodrine and levophed  Prior to admission HF Medications: Diuretic: furosemide 40 mg daily Beta blocker: metoprolol XL 50 mg daily ACE/ARB/ARNI: valsartan 80 mg daily MRA: spironolactone 25 mg daily SGLT2i: Farxiga 10 mg daily *carvedilol and HCTZ still on PTA med list (dc'd from [place] at discharge)  Pertinent Lab Values: Serum creatinine 1.35, BUN 31, Potassium 3.5, Sodium 135, BNP 1029.1, Magnesium 2.1, A1c 10.6   Vital Signs: Weight: 194 lbs (admission weight: 225 lbs) Blood pressure: 90/60s on pressors  Heart rate: 100s  I/O: -0.7L yesterday; net -3L  Medication Assistance / Insurance Benefits Check: Does the patient have prescription insurance?  Yes Type of insurance plan: [place] Medicaid  Outpatient Pharmacy:  Prior to admission outpatient pharmacy: [facility] Is the patient willing to use [facility] TOC pharmacy at discharge? Yes Is the patient willing to transition their outpatient pharmacy to utilize a [facility] outpatient pharmacy?   Pending    Assessment: 1. Acute on chronic systolic CHF (LVEF 20-25%), due to ICM. NYHA class III symptoms. - Continue furosemide 80 mg IV BID.  Strict I/Os and daily weights. Keep K>4 and Mg>2. KCl 60 mEq x 1 given. - GDMT limited by hypotension and AKI. Creatinine improving.  - Remains on pressors and starting midodrine   Plan: 1) Medication changes recommended at this time: - Agree with changes  2) Patient assistance: Sherryll Burger copay $0 - Jardiance copay $0  3)  Education  - To be completed prior to discharge  Sharen Hones, PharmD, BCPS Heart Failure Stewardship Pharmacist Phone [phone]

## 2022-05-07 NOTE — Progress Notes (Signed)
Heart Failure Navigator Progress Note  Following this hospitalization to assess for HV TOC readiness.   EF 30-35% Currently on Amiodarone and Levophed drips  Earnestine Leys, BSN, RN Heart Failure Leisure centre manager Chat Only

## 2022-05-07 NOTE — Progress Notes (Signed)
NAME:  Jerry Myers, MRN:  [id number], DOB:  November 07, 1950, LOS: 2 ADMISSION DATE:  05/05/2022, CONSULTATION DATE:  05/05/2022 REFERRING MD:  Dr. Nevada Crane, [facility], CHIEF COMPLAINT:  Short of breath   History of Present Illness:  72 yo male was at [facility] for CHF exacerbation and discharged home on 05/05/22 per patient request.  Family immediately called 911 and he was brought to [facility] ER.  He had dyspnea and leg swelling.  He was hypotensive and had fever.  He was started on Bipap in the ER, but patient requested to have this taken off.  Cardiology consulted by EDP.  Patient was confused and not able to provide complete medical history.  Due to respiratory distress, fever, and hypotension PCCM asked to assess for ICU admission.  Pertinent  Medical History  Anxiety, Arthritis, A flutter, CAD s/p CABG, Combined CHF, Depression, HTN, GERD, Nephrolithiasis, NSVT, DM type 2  Significant Hospital Events: Including procedures, antibiotic start and stop dates in addition to other pertinent events   1/13 admit, cardiology consulted, start Abx 1/14 started on tamiflu for flu. NE  Interim History / Subjective:  Remain on vasopressor support with Levophed  Objective   Blood pressure (!) 137/96, pulse 93, temperature 97.9 F (36.6 C), temperature source Oral, resp. rate (!) 23, height 5\' 8"  (1.727 m), weight 88.3 kg, SpO2 98 %.        Intake/Output Summary (Last 24 hours) at 05/07/2022 0827 Last data filed at 05/07/2022 0700 Gross per 24 hour  Intake 1915.16 ml  Output 2225 ml  Net -309.84 ml   Filed Weights   05/05/22 1820 05/06/22 0015  Weight: 102.1 kg 88.3 kg    Examination: Physical exam: General: Acute chronically ill-appearing male, lying on the bed HEENT: [place]/AT, eyes anicteric.  moist mucus membranes Neuro: Sleepy, opens eyes with vocal stimuli, following simple commands, complaining of feeling cold  Chest: Coarse breath sounds, no wheezes or rhonchi Heart: Tachycardic,  regular rhythm, no murmurs or gallops Abdomen: Soft, nontender, nondistended, bowel sounds present Skin: No rash   Resolved Hospital Problem list   Lactic acidosis, resolved  Assessment & Plan:  Acute hypoxic respiratory failure Sepsis with septic shock due to flu PNA R pleural effusion Continue nasal cannula oxygen, titrate with O2 sat goal 92% Continue droplet precautions Continue to require vasopressor support with Levophed Continue Tamiflu X-ray chest is suggestive of moderate right-sided effusion, on diuretic therapy  Acute on chronic systolic HF Paroxysmal Afib/flutter CAD s/p CABG Patient had echocardiogram done in 2020 which showed EF of 35% Repeat echocardiogram is pending Continue aggressive diuresis with Lasix, monitor intake and output He is hypotensive, unable to start GDMT Heart rate is better controlled Continue amiodarone infusion Continue Eliquis for stroke prophylaxis  CKD 3a, AKI was ruled out Hypokalemia Serum creatinine is at baseline Monitor intake and output Avoid nephrotoxic agents Continue aggressive electrolyte supplement  Acute septic encephalopathy Mental status is improving with treatment of sepsis Avoid sedation  Poorly controlled diabetes type 2 with hyperglycemia Patient blood sugars are not well-controlled His hemoglobin A1c is 10.6 Continue Semglee and sliding scale with CBG goal 140-180  Best Practice (right click and "Reselect all SmartList Selections" daily)   Diet/type: Consistent carbohydrate diet DVT prophylaxis: DOAC GI prophylaxis: N/A Lines: N/A Foley:  Yes, and it is still needed Code Status:  full code Last date of multidisciplinary goals of care discussion [1/15: Patient was updated at bedside]  Labs   CBC: Recent Labs  Lab 05/05/22 1635  05/05/22 2119 05/06/22 0235 05/06/22 1316 05/07/22 0731  WBC 5.6  --  5.7 7.0 4.7  NEUTROABS 3.9  --  4.2  --   --   HGB 13.2 13.6 12.1* 13.4 12.5*  HCT 40.9 40.0 35.5*  41.5 36.7*  MCV 91.3  --  89.2 91.6 89.5  PLT 271  --  220 233 161    Basic Metabolic Panel: Recent Labs  Lab 05/05/22 1635 05/05/22 2119 05/06/22 0235 05/06/22 1316  NA 140 141 142 137  K 3.6 3.4* 3.1* 3.9  CL 102  --  104 102  CO2 24  --  25 21*  GLUCOSE 189*  --  156* 203*  BUN 27*  --  29* 32*  CREATININE 1.70*  --  1.72* 1.73*  CALCIUM 8.9  --  8.4* 8.3*  MG  --   --  1.7 2.1  PHOS  --   --  4.9*  --    GFR: Estimated Creatinine Clearance: 41.1 mL/min (A) (by C-G formula based on SCr of 1.73 mg/dL (H)). Recent Labs  Lab 05/05/22 1635 05/05/22 1730 05/05/22 2110 05/06/22 0235 05/06/22 1316 05/07/22 0731  WBC 5.6  --   --  5.7 7.0 4.7  LATICACIDVEN  --  2.9* 2.5* 1.0  --   --     Liver Function Tests: Recent Labs  Lab 05/05/22 1635 05/06/22 0235 05/06/22 1316  AST 25 18 30   ALT 19 19 25   ALKPHOS 70 62 68  BILITOT 1.0 0.7 0.6  PROT 6.4* 5.6* 6.5  ALBUMIN 3.5 3.2* 3.4*   No results for input(s): "LIPASE", "AMYLASE" in the last 168 hours. No results for input(s): "AMMONIA" in the last 168 hours.  ABG    Component Value Date/Time   PHART 7.435 09/07/2018 0440   PCO2ART 34.1 09/07/2018 0440   PO2ART 64.0 (L) 09/07/2018 0440   HCO3 22.3 05/05/2022 2119   TCO2 23 05/05/2022 2119   ACIDBASEDEF 1.0 09/07/2018 0440   O2SAT 75 05/05/2022 2119     Coagulation Profile: No results for input(s): "INR", "PROTIME" in the last 168 hours.  Cardiac Enzymes: No results for input(s): "CKTOTAL", "CKMB", "CKMBINDEX", "TROPONINI" in the last 168 hours.  HbA1C: Hgb A1c MFr Bld  Date/Time Value Ref Range Status  05/06/2022 02:35 AM 10.6 (H) 4.8 - 5.6 % Final    Comment:    (NOTE) Pre diabetes:          5.7%-6.4%  Diabetes:              >6.4%  Glycemic control for   <7.0% adults with diabetes   12/31/2019 11:05 AM 13.4 (H) 4.8 - 5.6 % Final    Comment:             Prediabetes: 5.7 - 6.4          Diabetes: >6.4          Glycemic control for adults with  diabetes: <7.0     CBG: Recent Labs  Lab 05/06/22 1130 05/06/22 1632 05/06/22 1945 05/06/22 2356 05/07/22 0311  GLUCAP 185* 321* 403* 241* 132*    This patient is critically ill with multiple organ system failure which requires frequent high complexity decision making, assessment, support, evaluation, and titration of therapies. This was completed through the application of advanced monitoring technologies and extensive interpretation of multiple databases.  During this encounter critical care time was devoted to patient care services described in this note for 37 minutes.    [employer],  MD [place] [facility] See Amion for pager If no response to pager, please call [phone] until 7pm After 7pm, Please call E-link [phone]

## 2022-05-07 NOTE — Progress Notes (Signed)
  Echocardiogram 2D Echocardiogram has been performed.  Frances Furbish 05/07/2022, 1:18 PM

## 2022-05-07 NOTE — Progress Notes (Signed)
Pt not on BIPAP at this time, no distress noted, RT will continue to monitor.  

## 2022-05-08 ENCOUNTER — Other Ambulatory Visit (HOSPITAL_COMMUNITY): Payer: Self-pay

## 2022-05-08 DIAGNOSIS — J1100 Influenza due to unidentified influenza virus with unspecified type of pneumonia: Secondary | ICD-10-CM | POA: Diagnosis not present

## 2022-05-08 DIAGNOSIS — I5023 Acute on chronic systolic (congestive) heart failure: Secondary | ICD-10-CM | POA: Diagnosis not present

## 2022-05-08 DIAGNOSIS — I483 Typical atrial flutter: Secondary | ICD-10-CM | POA: Diagnosis not present

## 2022-05-08 DIAGNOSIS — A419 Sepsis, unspecified organism: Secondary | ICD-10-CM | POA: Diagnosis not present

## 2022-05-08 DIAGNOSIS — R6521 Severe sepsis with septic shock: Secondary | ICD-10-CM | POA: Diagnosis not present

## 2022-05-08 LAB — BASIC METABOLIC PANEL
Anion gap: 13 (ref 5–15)
BUN: 31 mg/dL — ABNORMAL HIGH (ref 8–23)
CO2: 23 mmol/L (ref 22–32)
Calcium: 8.2 mg/dL — ABNORMAL LOW (ref 8.9–10.3)
Chloride: 99 mmol/L (ref 98–111)
Creatinine, Ser: 1.35 mg/dL — ABNORMAL HIGH (ref 0.61–1.24)
GFR, Estimated: 55 mL/min — ABNORMAL LOW (ref >60)
Glucose, Bld: 102 mg/dL — ABNORMAL HIGH (ref 70–99)
Potassium: 3.5 mmol/L (ref 3.5–5.1)
Sodium: 135 mmol/L (ref 135–145)

## 2022-05-08 LAB — GLUCOSE, CAPILLARY
Glucose-Capillary: 104 mg/dL — ABNORMAL HIGH (ref 70–99)
Glucose-Capillary: 152 mg/dL — ABNORMAL HIGH (ref 70–99)
Glucose-Capillary: 160 mg/dL — ABNORMAL HIGH (ref 70–99)
Glucose-Capillary: 184 mg/dL — ABNORMAL HIGH (ref 70–99)
Glucose-Capillary: 204 mg/dL — ABNORMAL HIGH (ref 70–99)
Glucose-Capillary: 240 mg/dL — ABNORMAL HIGH (ref 70–99)
Glucose-Capillary: 270 mg/dL — ABNORMAL HIGH (ref 70–99)

## 2022-05-08 MED ORDER — POTASSIUM CHLORIDE CRYS ER 20 MEQ PO TBCR
60.0000 meq | EXTENDED_RELEASE_TABLET | Freq: Once | ORAL | Status: AC
  Administered 2022-05-08: 60 meq via ORAL
  Filled 2022-05-08: qty 3

## 2022-05-08 MED ORDER — MIDODRINE HCL 5 MG PO TABS
10.0000 mg | ORAL_TABLET | Freq: Three times a day (TID) | ORAL | Status: DC
  Administered 2022-05-08 – 2022-05-14 (×19): 10 mg via ORAL
  Filled 2022-05-08 (×19): qty 2

## 2022-05-08 MED ORDER — INSULIN GLARGINE-YFGN 100 UNIT/ML ~~LOC~~ SOLN
10.0000 [IU] | Freq: Two times a day (BID) | SUBCUTANEOUS | Status: DC
  Administered 2022-05-08 – 2022-05-15 (×13): 10 [IU] via SUBCUTANEOUS
  Filled 2022-05-08 (×20): qty 0.1

## 2022-05-08 MED ORDER — LIDOCAINE HCL URETHRAL/MUCOSAL 2 % EX GEL
1.0000 | Freq: Once | CUTANEOUS | Status: AC
  Administered 2022-05-08: 1 via URETHRAL
  Filled 2022-05-08: qty 6

## 2022-05-08 MED ORDER — DAPAGLIFLOZIN PROPANEDIOL 10 MG PO TABS
10.0000 mg | ORAL_TABLET | Freq: Every day | ORAL | Status: DC
  Filled 2022-05-08: qty 1

## 2022-05-08 NOTE — Progress Notes (Signed)
Inpatient Diabetes Program Recommendations  AACE/ADA: New Consensus Statement on Inpatient Glycemic Control (2015)  Target Ranges:  Prepandial:   less than 140 mg/dL      Peak postprandial:   less than 180 mg/dL (1-2 hours)      Critically ill patients:  140 - 180 mg/dL   Lab Results  Component Value Date   GLUCAP 240 (H) 05/08/2022   HGBA1C 10.6 (H) 05/06/2022    Review of Glycemic Control  Latest Reference Range & Units 05/08/22 00:13 05/08/22 04:48 05/08/22 07:51  Glucose-Capillary 70 - 99 mg/dL 270 (H) 104 (H) 240 (H)   Diabetes history: DM 2 Outpatient Diabetes medications:  Farxiga 10 mg daily Lantus 20 units q HS Januvia 100 mg daily Metformin 1000 mg bid Current orders for Inpatient glycemic control:  Novolog 0-15 units q 4 hours Semglee 10 units bid Inpatient Diabetes Program Recommendations:    Agree with increase in Semglee.  It appears that patient was on insulin prior to admit, however A1C is still high.  Will talk to patient when he is feeling a little better.  Once eating consistently, may want to change Novolog to tid with meals and HS.  It appears that patient may need meal coverage as well.   Thanks,  Adah Perl, RN, BC-ADM Inpatient Diabetes Coordinator Pager [phone]  (8a-5p)

## 2022-05-08 NOTE — Progress Notes (Signed)
[facility] [facility] Progress Note Patient Name: Jerry Myers DOB: [birth year] MRN: [id number]   Date of Service  05/08/2022  HPI/Events of Note  Patient with urethral irritation from Foley catheter.  eICU Interventions  Topical 2 % Lidocaine jell  ordered to be applied topically to the area.        Frederik Pear 05/08/2022, 8:38 PM

## 2022-05-08 NOTE — Progress Notes (Signed)
NAME:  Jerry Myers, MRN:  [id number], DOB:  November 20, 1950, LOS: 3 ADMISSION DATE:  05/05/2022, CONSULTATION DATE:  05/05/2022 REFERRING MD:  Dr. Nevada Crane, [facility], CHIEF COMPLAINT:  Short of breath   History of Present Illness:  72 yo male was at [facility] for CHF exacerbation and discharged home on 05/05/22 per patient request.  Family immediately called 911 and he was brought to [facility] ER.  He had dyspnea and leg swelling.  He was hypotensive and had fever.  He was started on Bipap in the ER, but patient requested to have this taken off.  Cardiology consulted by EDP.  Patient was confused and not able to provide complete medical history.  Due to respiratory distress, fever, and hypotension PCCM asked to assess for ICU admission.  Pertinent  Medical History  Anxiety, Arthritis, A flutter, CAD s/p CABG, Combined CHF, Depression, HTN, GERD, Nephrolithiasis, NSVT, DM type 2  Significant Hospital Events: Including procedures, antibiotic start and stop dates in addition to other pertinent events   1/13 admit, cardiology consulted, start Abx 1/14 started on tamiflu for flu. NE  Interim History / Subjective:  Require vasopressor support Stated feeling better Remained afebrile Remain in A-fib with RVR  Objective   Blood pressure 108/88, pulse (!) 108, temperature 98.4 F (36.9 C), temperature source Oral, resp. rate 17, height 5\' 8"  (1.727 m), weight 88.3 kg, SpO2 95 %.        Intake/Output Summary (Last 24 hours) at 05/08/2022 0847 Last data filed at 05/08/2022 0800 Gross per 24 hour  Intake 2246.55 ml  Output 2890 ml  Net -643.45 ml   Filed Weights   05/05/22 1820 05/06/22 0015  Weight: 102.1 kg 88.3 kg    Examination: Physical exam: General: Acute on chronically ill-appearing male, sitting on the couch HEENT: [place]/AT, eyes anicteric.  moist mucus membranes Neuro: Alert, awake following commands Chest: Coarse breath sounds, no wheezes or rhonchi Heart: Irregularly irregular,  tachycardic no murmurs or gallops Abdomen: Soft, nontender, nondistended, bowel sounds present Skin: No rash   Resolved Hospital Problem list   Lactic acidosis, resolved  Assessment & Plan:  Acute hypoxic respiratory failure Sepsis with septic shock due to flu PNA R pleural effusion Continue nasal cannula oxygen, titrate with O2 sat goal 92%, currently on 2 L Continue droplet precautions Still requiring Levophed to maintain map goal 65 Started on midodrine 10 mg 3 times daily Continue Tamiflu  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 Paroxysmal Afib/flutter CAD s/p CABG Patient repeat echocardiogram showed EF of 25% with right ventricular dysfunction Continue aggressive diuresis with Lasix, monitor intake and output He is requiring vasopressor support,, unable to start GDMT Heart rate still not well-controlled Continue amiodarone infusion Continue Eliquis for stroke prophylaxis  CKD 3a, AKI was ruled out Hypokalemia Serum creatinine continue to improve Monitor intake and output Avoid nephrotoxic agents Continue aggressive electrolyte supplement  Acute septic encephalopathy Mental status has been significantly  Poorly controlled diabetes type 2 with hyperglycemia Patient blood sugars remain labile His hemoglobin A1c is 10.6 Change Semglee to 10 units twice daily Continue sliding scale with CBG goal 140-180  Best Practice (right click and "Reselect all SmartList Selections" daily)   Diet/type: Consistent carbohydrate diet DVT prophylaxis: DOAC GI prophylaxis: N/A Lines: N/A Foley:  Yes, and it is still needed Code Status:  full code Last date of multidisciplinary goals of care discussion [1/16: Patient was updated at bedside]  Labs   CBC: Recent Labs  Lab 05/05/22 1635 05/05/22 2119 05/06/22  0235 05/06/22 1316 05/07/22 0731  WBC 5.6  --  5.7 7.0 4.7  NEUTROABS 3.9  --  4.2  --   --   HGB 13.2 13.6 12.1* 13.4 12.5*  HCT 40.9 40.0 35.5* 41.5 36.7*  MCV  91.3  --  89.2 91.6 89.5  PLT 271  --  220 233 409    Basic Metabolic Panel: Recent Labs  Lab 05/05/22 1635 05/05/22 2119 05/06/22 0235 05/06/22 1316 05/07/22 0731 05/08/22 0430  NA 140 141 142 137 136 135  K 3.6 3.4* 3.1* 3.9 3.1* 3.5  CL 102  --  104 102 102 99  CO2 24  --  25 21* 26 23  GLUCOSE 189*  --  156* 203* 93 102*  BUN 27*  --  29* 32* 36* 31*  CREATININE 1.70*  --  1.72* 1.73* 1.59* 1.35*  CALCIUM 8.9  --  8.4* 8.3* 8.0* 8.2*  MG  --   --  1.7 2.1 2.1  --   PHOS  --   --  4.9*  --  3.8  --    GFR: Estimated Creatinine Clearance: 51.9 mL/min (A) (by C-G formula based on SCr of 1.35 mg/dL (H)). Recent Labs  Lab 05/05/22 1635 05/05/22 1730 05/05/22 2110 05/06/22 0235 05/06/22 1316 05/07/22 0731  WBC 5.6  --   --  5.7 7.0 4.7  LATICACIDVEN  --  2.9* 2.5* 1.0  --   --     Liver Function Tests: Recent Labs  Lab 05/05/22 1635 05/06/22 0235 05/06/22 1316  AST 25 18 30   ALT 19 19 25   ALKPHOS 70 62 68  BILITOT 1.0 0.7 0.6  PROT 6.4* 5.6* 6.5  ALBUMIN 3.5 3.2* 3.4*   No results for input(s): "LIPASE", "AMYLASE" in the last 168 hours. No results for input(s): "AMMONIA" in the last 168 hours.  ABG    Component Value Date/Time   PHART 7.435 09/07/2018 0440   PCO2ART 34.1 09/07/2018 0440   PO2ART 64.0 (L) 09/07/2018 0440   HCO3 22.3 05/05/2022 2119   TCO2 23 05/05/2022 2119   ACIDBASEDEF 1.0 09/07/2018 0440   O2SAT 75 05/05/2022 2119     Coagulation Profile: No results for input(s): "INR", "PROTIME" in the last 168 hours.  Cardiac Enzymes: No results for input(s): "CKTOTAL", "CKMB", "CKMBINDEX", "TROPONINI" in the last 168 hours.  HbA1C: Hgb A1c MFr Bld  Date/Time Value Ref Range Status  05/06/2022 02:35 AM 10.6 (H) 4.8 - 5.6 % Final    Comment:    (NOTE) Pre diabetes:          5.7%-6.4%  Diabetes:              >6.4%  Glycemic control for   <7.0% adults with diabetes   12/31/2019 11:05 AM 13.4 (H) 4.8 - 5.6 % Final    Comment:              Prediabetes: 5.7 - 6.4          Diabetes: >6.4          Glycemic control for adults with diabetes: <7.0     CBG: Recent Labs  Lab 05/07/22 1945 05/07/22 2018 05/08/22 0013 05/08/22 0448 05/08/22 0751  GLUCAP 325* 291* 270* 104* 240*    This patient is critically ill with multiple organ system failure which requires frequent high complexity decision making, assessment, support, evaluation, and titration of therapies. This was completed through the application of advanced monitoring technologies and extensive interpretation of multiple databases.  During this encounter critical care time was devoted to patient care services described in this note for 34 minutes.    Cheri Fowler, MD [place] [facility] See Amion for pager If no response to pager, please call [phone] until 7pm After 7pm, Please call E-link [phone]

## 2022-05-08 NOTE — Progress Notes (Signed)
Pt not on BIPAP at this time, no distress noted, RT will continue to monitor.

## 2022-05-08 NOTE — Progress Notes (Addendum)
Physical Therapy Treatment Patient Details Name: Jerry Myers MRN: [id number] DOB: 1950-06-05 Today's Date: 05/08/2022   History of Present Illness Pt is a 72 y.o. M who presents 05/05/2022 for evaluation of acute on chronic systolic heart failure and influenza A. Significant PMH: CAD s/p CABG x 4, DM, HTN, HLD.    PT Comments    Pt maintaining level of mobility, however, not making functional progress today. He is motivated to participate and appreciative. Pt ambulating ~75 ft with a walker at a min assist level. Demonstrates festinating like gait with decreased bilateral foot clearance and significantly slowed gait speed, thus placing him at high risk for falls. As pt lives alone and has decreased caregiver support, recommend SNF at this time.    Recommendations for follow up therapy are one component of a multi-disciplinary discharge planning process, led by the attending physician.  Recommendations may be updated based on patient status, additional functional criteria and insurance authorization.  Follow Up Recommendations  Skilled nursing-short term rehab (<3 hours/day) (pt likely to decline) Can patient physically be transported by private vehicle: Yes   Assistance Recommended at Discharge Frequent or constant Supervision/Assistance  Patient can return home with the following A little help with walking and/or transfers;A little help with bathing/dressing/bathroom;Assistance with cooking/housework;Assist for transportation;Help with stairs or ramp for entrance   Equipment Recommendations  Rolling walker (2 wheels)    Recommendations for Other Services       Precautions / Restrictions Precautions Precautions: Fall Restrictions Weight Bearing Restrictions: No     Mobility  Bed Mobility Overal bed mobility: Needs Assistance Bed Mobility: Supine to Sit     Supine to sit: Mod assist     General bed mobility comments: Initiating, however, requires assist to execute and  problem solve. use of bed pad to scoot left hip forward    Transfers Overall transfer level: Needs assistance Equipment used: Rolling walker (2 wheels) Transfers: Sit to/from Stand Sit to Stand: Min assist           General transfer comment: MinA to rise and steady. cues for hand placement    Ambulation/Gait Ambulation/Gait assistance: Min assist Gait Distance (Feet): 75 Feet Assistive device: Rolling walker (2 wheels) Gait Pattern/deviations: Step-through pattern, Decreased stride length, Shuffle, Festinating Gait velocity: decreased Gait velocity interpretation: <1.31 ft/sec, indicative of household ambulator   General Gait Details: Verbal cues for larger step lengths, walker proximity, however pt unable to alter   Stairs             Wheelchair Mobility    Modified Rankin (Stroke Patients Only)       Balance Overall balance assessment: Needs assistance Sitting-balance support: Feet supported Sitting balance-Leahy Scale: Fair     Standing balance support: Bilateral upper extremity supported Standing balance-Leahy Scale: Poor Standing balance comment: reliant on RW                            Cognition Arousal/Alertness: Awake/alert Behavior During Therapy: Flat affect Overall Cognitive Status: No family/caregiver present to determine baseline cognitive functioning                                 General Comments: required simple 1 step commands, poor problem solving and safety awareness. Poor insight and self monitoring. not oriented to day of week.        Exercises      General  Comments General comments (skin integrity, edema, etc.): VSS on 2-4 L O2      Pertinent Vitals/Pain Pain Assessment Pain Assessment: No/denies pain    Home Living                          Prior Function            PT Goals (current goals can now be found in the care plan section) Acute Rehab PT Goals Patient Stated Goal: to  go home Potential to Achieve Goals: Good Progress towards PT goals: Progressing toward goals    Frequency    Min 3X/week      PT Plan Discharge plan needs to be updated    Co-evaluation              AM-PAC PT "6 Clicks" Mobility   Outcome Measure  Help needed turning from your back to your side while in a flat bed without using bedrails?: A Little Help needed moving from lying on your back to sitting on the side of a flat bed without using bedrails?: A Lot Help needed moving to and from a bed to a chair (including a wheelchair)?: A Little Help needed standing up from a chair using your arms (e.g., wheelchair or bedside chair)?: A Little Help needed to walk in hospital room?: A Little Help needed climbing 3-5 steps with a railing? : A Lot 6 Click Score: 16    End of Session Equipment Utilized During Treatment: Gait belt;Oxygen Activity Tolerance: Patient tolerated treatment well Patient left: in chair;with call bell/phone within reach;with chair alarm set Nurse Communication: Mobility status PT Visit Diagnosis: Unsteadiness on feet (R26.81);Muscle weakness (generalized) (M62.81);Difficulty in walking, not elsewhere classified (R26.2)     Time: 9983-3825 PT Time Calculation (min) (ACUTE ONLY): 28 min  Charges:  $Gait Training: 8-22 mins $Therapeutic Activity: 8-22 mins                     Wyona Almas, PT, DPT Acute Rehabilitation Services Office [place] 05/08/2022, 1:07 PM

## 2022-05-08 NOTE — Progress Notes (Signed)
Rounding Note    Patient Name: Jerry Myers Date of Encounter: 05/08/2022  [facility] Cardiologist: None Dr. Debara Pickett  Subjective   Feels some shortness of breath at times.  Currently feels okay.  Inpatient Medications    Scheduled Meds:  apixaban  5 mg Oral BID   Chlorhexidine Gluconate Cloth  6 each Topical Daily   furosemide  80 mg Intravenous BID   insulin aspart  0-15 Units Subcutaneous Q4H   insulin glargine-yfgn  15 Units Subcutaneous QHS   oseltamivir  30 mg Oral BID   Continuous Infusions:  sodium chloride     amiodarone 30 mg/hr (05/08/22 0600)   norepinephrine (LEVOPHED) Adult infusion 3 mcg/min (05/08/22 0600)   PRN Meds: acetaminophen   Vital Signs    Vitals:   05/08/22 0615 05/08/22 0630 05/08/22 0645 05/08/22 0700  BP: (!) 130/116 92/67  108/85  Pulse: (!) 102 (!) 125 (!) 105 (!) 56  Resp: (!) 30 (!) 30 (!) 38 (!) 36  Temp:      TempSrc:      SpO2: 93% 91% 95% 94%  Weight:      Height:        Intake/Output Summary (Last 24 hours) at 05/08/2022 0733 Last data filed at 05/08/2022 0600 Gross per 24 hour  Intake 2427.67 ml  Output 2940 ml  Net -512.33 ml      05/06/2022   12:15 AM 05/05/2022    6:20 PM 02/18/2020    3:24 PM  Last 3 Weights  Weight (lbs) 194 lb 10.7 oz 225 lb 212 lb 9.6 oz  Weight (kg) 88.3 kg 102.059 kg 96.435 kg      Telemetry    Atrial fibrillation- Personally Reviewed  ECG      Physical Exam   GEN: No acute distress.   Neck: No JVD Cardiac: Irregularly irregular, no murmurs, rubs, or gallops.  Respiratory: Clear to auscultation bilaterally. GI: Soft, nontender, non-distended  MS: No edema; No deformity. Neuro:  Nonfocal  Psych: Normal affect   Labs    High Sensitivity Troponin:   Recent Labs  Lab 05/05/22 1635 05/05/22 2110  TROPONINIHS 93* 83*     Chemistry Recent Labs  Lab 05/05/22 1635 05/05/22 2119 05/06/22 0235 05/06/22 1316 05/07/22 0731 05/08/22 0430  NA 140   < > 142  137 136 135  K 3.6   < > 3.1* 3.9 3.1* 3.5  CL 102  --  104 102 102 99  CO2 24  --  25 21* 26 23  GLUCOSE 189*  --  156* 203* 93 102*  BUN 27*  --  29* 32* 36* 31*  CREATININE 1.70*  --  1.72* 1.73* 1.59* 1.35*  CALCIUM 8.9  --  8.4* 8.3* 8.0* 8.2*  MG  --   --  1.7 2.1 2.1  --   PROT 6.4*  --  5.6* 6.5  --   --   ALBUMIN 3.5  --  3.2* 3.4*  --   --   AST 25  --  18 30  --   --   ALT 19  --  19 25  --   --   ALKPHOS 70  --  62 68  --   --   BILITOT 1.0  --  0.7 0.6  --   --   GFRNONAA 42*  --  41* 41* 46* 55*  ANIONGAP 14  --  13 14 8 13    < > = values in  this interval not displayed.    Lipids No results for input(s): "CHOL", "TRIG", "HDL", "LABVLDL", "LDLCALC", "CHOLHDL" in the last 168 hours.  Hematology Recent Labs  Lab 05/06/22 0235 05/06/22 1316 05/07/22 0731  WBC 5.7 7.0 4.7  RBC 3.98* 4.53 4.10*  HGB 12.1* 13.4 12.5*  HCT 35.5* 41.5 36.7*  MCV 89.2 91.6 89.5  MCH 30.4 29.6 30.5  MCHC 34.1 32.3 34.1  RDW 16.0* 16.0* 15.6*  PLT 220 233 225   Thyroid No results for input(s): "TSH", "FREET4" in the last 168 hours.  BNP Recent Labs  Lab 05/05/22 1635 05/06/22 1316  BNP 1,029.1* 955.5*    DDimer No results for input(s): "DDIMER" in the last 168 hours.   Radiology    ECHOCARDIOGRAM COMPLETE  Result Date: 05/07/2022    ECHOCARDIOGRAM REPORT   Patient Name:   Jerry Myers Date of Exam: 05/07/2022 Medical Rec #:  [id number]        Height:       68.0 in Accession #:    [id number]       Weight:       194.7 lb Date of Birth:  21-Jan-1951        BSA:          2.020 m Patient Age:    72 years         BP:           105/76 mmHg Patient Gender: M                HR:           108 bpm. Exam Location:  Inpatient Procedure: 2D Echo, Cardiac Doppler and Color Doppler Indications:    W73.71 Acute systolic (congestive) heart failure  History:        Patient has prior history of Echocardiogram examinations. CAD,                 Prior CABG, Arrythmias:Atrial Flutter; Risk  Factors:Diabetes and                 Hypertension.  Sonographer:    Phineas Douglas Referring Phys: [id number] [place]  1. Left ventricular ejection fraction, by estimation, is 20 to 25%. The left ventricle has severely decreased function. The left ventricle demonstrates global hypokinesis. The left ventricular internal cavity size was moderately dilated. There is mild left ventricular hypertrophy. Left ventricular diastolic parameters are indeterminate.  2. Right ventricular systolic function is moderately reduced. The right ventricular size is moderately enlarged. There is mildly elevated pulmonary artery systolic pressure.  3. Left atrial size was moderately dilated.  4. The mitral valve is abnormal. Moderate mitral valve regurgitation. No evidence of mitral stenosis.  5. Tricuspid valve regurgitation is moderate.  6. The aortic valve is tricuspid. There is moderate calcification of the aortic valve. There is moderate thickening of the aortic valve. Aortic valve regurgitation is not visualized. Aortic valve sclerosis/calcification is present, without any evidence of aortic stenosis.  7. Aortic dilatation noted. There is moderate dilatation of the ascending aorta, measuring 42 mm.  8. The inferior vena cava is dilated in size with <50% respiratory variability, suggesting right atrial pressure of 15 mmHg. FINDINGS  Left Ventricle: Left ventricular ejection fraction, by estimation, is 20 to 25%. The left ventricle has severely decreased function. The left ventricle demonstrates global hypokinesis. The left ventricular internal cavity size was moderately dilated. There is mild left ventricular hypertrophy. Left ventricular diastolic parameters are indeterminate. Right Ventricle: The right ventricular  size is moderately enlarged. Right vetricular wall thickness was not assessed. Right ventricular systolic function is moderately reduced. There is mildly elevated pulmonary artery systolic pressure. The  tricuspid regurgitant velocity is 2.41 m/s, and with an assumed right atrial pressure of 15 mmHg, the estimated right ventricular systolic pressure is 38.2 mmHg. Left Atrium: Left atrial size was moderately dilated. Right Atrium: Right atrial size was normal in size. Pericardium: There is no evidence of pericardial effusion. Mitral Valve: The mitral valve is abnormal. There is moderate thickening of the mitral valve leaflet(s). Moderate mitral valve regurgitation. No evidence of mitral valve stenosis. Tricuspid Valve: The tricuspid valve is normal in structure. Tricuspid valve regurgitation is moderate . No evidence of tricuspid stenosis. Aortic Valve: The aortic valve is tricuspid. There is moderate calcification of the aortic valve. There is moderate thickening of the aortic valve. Aortic valve regurgitation is not visualized. Aortic valve sclerosis/calcification is present, without any  evidence of aortic stenosis. Pulmonic Valve: The pulmonic valve was normal in structure. Pulmonic valve regurgitation is mild. No evidence of pulmonic stenosis. Aorta: Aortic dilatation noted. There is moderate dilatation of the ascending aorta, measuring 42 mm. Venous: The inferior vena cava is dilated in size with less than 50% respiratory variability, suggesting right atrial pressure of 15 mmHg. IAS/Shunts: No atrial level shunt detected by color flow Doppler.  LEFT VENTRICLE PLAX 2D LVIDd:         6.10 cm      Diastology LVIDs:         5.30 cm      LV e' medial:    3.89 cm/s LV PW:         1.30 cm      LV E/e' medial:  30.6 LV IVS:        1.30 cm      LV e' lateral:   8.68 cm/s LVOT diam:     2.10 cm      LV E/e' lateral: 13.7 LV SV:         30 LV SV Index:   15 LVOT Area:     3.46 cm  LV Volumes (MOD) LV vol d, MOD A2C: 202.0 ml LV vol d, MOD A4C: 192.0 ml LV vol s, MOD A2C: 155.0 ml LV vol s, MOD A4C: 147.0 ml LV SV MOD A2C:     47.0 ml LV SV MOD A4C:     192.0 ml LV SV MOD BP:      52.3 ml RIGHT VENTRICLE            IVC RV  Basal diam:  5.10 cm    IVC diam: 2.50 cm RV S prime:     6.83 cm/s TAPSE (M-mode): 1.1 cm LEFT ATRIUM              Index        RIGHT ATRIUM           Index LA diam:        4.30 cm  2.13 cm/m   RA Area:     29.70 cm LA Vol (A2C):   116.0 ml 57.41 ml/m  RA Volume:   99.60 ml  49.30 ml/m LA Vol (A4C):   97.7 ml  48.36 ml/m LA Biplane Vol: 108.0 ml 53.45 ml/m  AORTIC VALVE             PULMONIC VALVE LVOT Vmax:   67.10 cm/s  PR End Diast Vel: 10.24 msec LVOT Vmean:  43.000 cm/s LVOT  VTI:    0.086 m  AORTA Ao Root diam: 3.70 cm Ao Asc diam:  4.20 cm MITRAL VALVE                  TRICUSPID VALVE MV Area (PHT): 4.26 cm       TR Peak grad:   23.2 mmHg MV Decel Time: 178 msec       TR Vmax:        241.00 cm/s MR Peak grad:    47.9 mmHg MR Mean grad:    30.0 mmHg    SHUNTS MR Vmax:         346.00 cm/s  Systemic VTI:  0.09 m MR Vmean:        252.0 cm/s   Systemic Diam: 2.10 cm MR PISA:         1.57 cm MR PISA Eff ROA: 15 mm MR PISA Radius:  0.50 cm MV E velocity: 119.00 cm/s Charlton Haws MD Electronically signed by Charlton Haws MD Signature Date/Time: 05/07/2022/2:32:22 PM    Final     Cardiac Studies   LVEF 20%  Patient Profile     72 y.o. male with low ejection fraction, likely combined cardiogenic and septic shock.  Prior bypass surgery in 2020.  Atrial fibrillation.  Assessment & Plan    Atrial fibrillation: Planning for TEE cardioversion on Friday depending on his clinical status.  IV amiodarone for now.  Acute on chronic systolic heart failure: Blood pressure is limited medical therapy.  He has been on low-dose Levophed.  Influenza: Being treated with Tamiflu.  CAD: Status post CABG.  Continue medical therapy.     For questions or updates, please contact [facility] Please consult [email or web address] for contact info under        Signed, Lance Muss, MD  05/08/2022, 7:33 AM

## 2022-05-09 DIAGNOSIS — J1100 Influenza due to unidentified influenza virus with unspecified type of pneumonia: Secondary | ICD-10-CM | POA: Diagnosis not present

## 2022-05-09 DIAGNOSIS — I483 Typical atrial flutter: Secondary | ICD-10-CM | POA: Diagnosis not present

## 2022-05-09 DIAGNOSIS — I5023 Acute on chronic systolic (congestive) heart failure: Secondary | ICD-10-CM | POA: Diagnosis not present

## 2022-05-09 DIAGNOSIS — I509 Heart failure, unspecified: Secondary | ICD-10-CM

## 2022-05-09 DIAGNOSIS — A419 Sepsis, unspecified organism: Secondary | ICD-10-CM | POA: Diagnosis not present

## 2022-05-09 LAB — BASIC METABOLIC PANEL
Anion gap: 12 (ref 5–15)
BUN: 29 mg/dL — ABNORMAL HIGH (ref 8–23)
CO2: 23 mmol/L (ref 22–32)
Calcium: 8.3 mg/dL — ABNORMAL LOW (ref 8.9–10.3)
Chloride: 99 mmol/L (ref 98–111)
Creatinine, Ser: 1.35 mg/dL — ABNORMAL HIGH (ref 0.61–1.24)
GFR, Estimated: 55 mL/min — ABNORMAL LOW (ref >60)
Glucose, Bld: 108 mg/dL — ABNORMAL HIGH (ref 70–99)
Potassium: 3.7 mmol/L (ref 3.5–5.1)
Sodium: 134 mmol/L — ABNORMAL LOW (ref 135–145)

## 2022-05-09 LAB — GLUCOSE, CAPILLARY
Glucose-Capillary: 106 mg/dL — ABNORMAL HIGH (ref 70–99)
Glucose-Capillary: 150 mg/dL — ABNORMAL HIGH (ref 70–99)
Glucose-Capillary: 229 mg/dL — ABNORMAL HIGH (ref 70–99)
Glucose-Capillary: 221 mg/dL — ABNORMAL HIGH (ref 70–99)
Glucose-Capillary: 229 mg/dL — ABNORMAL HIGH (ref 70–99)

## 2022-05-09 MED ORDER — AMIODARONE HCL 200 MG PO TABS
200.0000 mg | ORAL_TABLET | Freq: Two times a day (BID) | ORAL | Status: DC
  Administered 2022-05-09 – 2022-05-15 (×13): 200 mg via ORAL
  Filled 2022-05-09 (×14): qty 1

## 2022-05-09 MED ORDER — POTASSIUM CHLORIDE CRYS ER 20 MEQ PO TBCR
40.0000 meq | EXTENDED_RELEASE_TABLET | Freq: Once | ORAL | Status: AC
  Administered 2022-05-09: 40 meq via ORAL
  Filled 2022-05-09: qty 2

## 2022-05-09 MED ORDER — ORAL CARE MOUTH RINSE: 15.0000 mL | OROMUCOSAL | Status: DC | PRN

## 2022-05-09 NOTE — Progress Notes (Signed)
   Heart Failure Stewardship Pharmacist Progress Note   PCP: Patient, No Pcp Per PCP-Cardiologist: None    HPI:  72 yo M with PMH of CAD s/p CABG in 2020, HTN, T2DM, and HLD.   He presented to the ED on 1/13 for CHF exacerbation, weakness, and shortness of breath. He was at [facility] for 3-4 days but was wanting to leave so he was discharged. Was in respiratory distress and hypotensive, CCM consulted and was admitted to the ICU. CXR with R pleural effusion and bibasilar atelectasis. Lactate 2.9 Antibiotics started for PNA. Lactate improved to 1.0. ECHO 1/15 showed LVEF 20-25%, global hypokinesis, mild LVH, RV moderately reduced, moderate MR and TR. Remains in afib RVR. Plan TEE/DCCV on Friday pending clinical status.   Current HF Medications: Diuretic: furosemide 80 mg IV BID *also on midodrine  Prior to admission HF Medications: Diuretic: furosemide 40 mg daily Beta blocker: metoprolol XL 50 mg daily ACE/ARB/ARNI: valsartan 80 mg daily MRA: spironolactone 25 mg daily SGLT2i: Farxiga 10 mg daily *carvedilol and HCTZ still on PTA med list (dc'd from [place] at discharge)  Pertinent Lab Values: Serum creatinine 1.35, BUN 29, Potassium 3.7, Sodium 134, BNP 1029.1, Magnesium 2.1, A1c 10.6   Vital Signs: Weight: 194 lbs (admission weight: 225 lbs) Blood pressure: 90/70s off pressors  Heart rate: 100s  I/O: -1.5L yesterday; net -5.2L  Medication Assistance / Insurance Benefits Check: Does the patient have prescription insurance?  Yes Type of insurance plan: [place] Medicaid  Outpatient Pharmacy:  Prior to admission outpatient pharmacy: [facility] Is the patient willing to use [place] pharmacy at discharge? Yes Is the patient willing to transition their outpatient pharmacy to utilize a [facility] outpatient pharmacy?   Pending    Assessment: 1. Acute on chronic systolic CHF (LVEF 73-71%), due to ICM. NYHA class III symptoms. - Continue furosemide 80 mg IV BID. Strict  I/Os and daily weights. Keep K>4 and Mg>2. KCl 40 mEq x 1 ordered for replacement. - GDMT limited by hypotension and AKI. Creatinine improving. Now off pressors. Trend BP. - Continue midodrine 10 mg TID   Plan: 1) Medication changes recommended at this time: - Agree with changes  2) Patient assistance: Delene Loll copay $0 - Jardiance copay $0  3)  Education  - To be completed prior to discharge  Kerby Nora, PharmD, BCPS Heart Failure Stewardship Pharmacist Phone [phone]

## 2022-05-09 NOTE — Progress Notes (Signed)
Rounding Note    Patient Name: Jerry Myers Date of Encounter: 05/09/2022  [place] Cardiologist: None [place]  Subjective   Difficulty weaning levophed.  Midodrine started yesterday.   Inpatient Medications    Scheduled Meds:  apixaban  5 mg Oral BID   Chlorhexidine Gluconate Cloth  6 each Topical Daily   furosemide  80 mg Intravenous BID   insulin aspart  0-15 Units Subcutaneous Q4H   insulin glargine-yfgn  10 Units Subcutaneous BID   midodrine  10 mg Oral TID WC   oseltamivir  30 mg Oral BID   Continuous Infusions:  sodium chloride     amiodarone 30 mg/hr (05/09/22 0700)   norepinephrine (LEVOPHED) Adult infusion 1 mcg/min (05/09/22 0700)   PRN Meds: acetaminophen, mouth rinse   Vital Signs    Vitals:   05/09/22 0758 05/09/22 0800 05/09/22 0815 05/09/22 0830  BP:  (!) 117/106 98/73 95/78  Pulse:  (!) 108 (!) 112 (!) 109  Resp:  (!) 31 17 (!) 22  Temp: 98.1 F (36.7 C)     TempSrc: Axillary     SpO2:  96% (!) 89% 97%  Weight:      Height:        Intake/Output Summary (Last 24 hours) at 05/09/2022 0918 Last data filed at 05/09/2022 0700 Gross per 24 hour  Intake 551.49 ml  Output 2636 ml  Net -2084.51 ml      05/06/2022   12:15 AM 05/05/2022    6:20 PM 02/18/2020    3:24 PM  Last 3 Weights  Weight (lbs) 194 lb 10.7 oz 225 lb 212 lb 9.6 oz  Weight (kg) 88.3 kg 102.059 kg 96.435 kg      Telemetry    Atrial flutter - Personally Reviewed  ECG      Physical Exam   GEN: No acute distress.   Neck: No JVD Cardiac: RRR, no murmurs, rubs, or gallops.  Respiratory: Clear to auscultation bilaterally. GI: Soft, nontender, non-distended  MS: No edema; No deformity. Neuro:  Nonfocal  Psych: Normal affect   Labs    High Sensitivity Troponin:   Recent Labs  Lab 05/05/22 1635 05/05/22 2110  TROPONINIHS 93* 83*     Chemistry Recent Labs  Lab 05/05/22 1635 05/05/22 2119 05/06/22 0235 05/06/22 1316 05/07/22 0731  05/08/22 0430 05/09/22 0703  NA 140   < > 142 137 136 135 134*  K 3.6   < > 3.1* 3.9 3.1* 3.5 3.7  CL 102  --  104 102 102 99 99  CO2 24  --  25 21* 26 23 23   GLUCOSE 189*  --  156* 203* 93 102* 108*  BUN 27*  --  29* 32* 36* 31* 29*  CREATININE 1.70*  --  1.72* 1.73* 1.59* 1.35* 1.35*  CALCIUM 8.9  --  8.4* 8.3* 8.0* 8.2* 8.3*  MG  --   --  1.7 2.1 2.1  --   --   PROT 6.4*  --  5.6* 6.5  --   --   --   ALBUMIN 3.5  --  3.2* 3.4*  --   --   --   AST 25  --  18 30  --   --   --   ALT 19  --  19 25  --   --   --   ALKPHOS 70  --  62 68  --   --   --   BILITOT 1.0  --  0.7 0.6  --   --   --   GFRNONAA 42*  --  41* 41* 46* 55* 55*  ANIONGAP 14  --  13 14 8 13 12    < > = values in this interval not displayed.    Lipids No results for input(s): "CHOL", "TRIG", "HDL", "LABVLDL", "LDLCALC", "CHOLHDL" in the last 168 hours.  Hematology Recent Labs  Lab 05/06/22 0235 05/06/22 1316 05/07/22 0731  WBC 5.7 7.0 4.7  RBC 3.98* 4.53 4.10*  HGB 12.1* 13.4 12.5*  HCT 35.5* 41.5 36.7*  MCV 89.2 91.6 89.5  MCH 30.4 29.6 30.5  MCHC 34.1 32.3 34.1  RDW 16.0* 16.0* 15.6*  PLT 220 233 225   Thyroid No results for input(s): "TSH", "FREET4" in the last 168 hours.  BNP Recent Labs  Lab 05/05/22 1635 05/06/22 1316  BNP 1,029.1* 955.5*    DDimer No results for input(s): "DDIMER" in the last 168 hours.   Radiology    ECHOCARDIOGRAM COMPLETE  Result Date: 05/07/2022    ECHOCARDIOGRAM REPORT   Patient Name:   Jerry Myers Date of Exam: 05/07/2022 Medical Rec #:  05/09/2022        Height:       68.0 in Accession #:    [id number]       Weight:       194.7 lb Date of Birth:  [birth year]        BSA:          2.020 m Patient Age:    72 years         BP:           105/76 mmHg Patient Gender: M                HR:           108 bpm. Exam Location:  Inpatient Procedure: 2D Echo, Cardiac Doppler and Color Doppler Indications:    I50.21 Acute systolic (congestive) heart failure  History:        Patient  has prior history of Echocardiogram examinations. CAD,                 Prior CABG, Arrythmias:Atrial Flutter; Risk Factors:Diabetes and                 Hypertension.  Sonographer:    05/10/1950 Referring Phys: Mike Gip SUDHAM CHAND IMPRESSIONS  1. Left ventricular ejection fraction, by estimation, is 20 to 25%. The left ventricle has severely decreased function. The left ventricle demonstrates global hypokinesis. The left ventricular internal cavity size was moderately dilated. There is mild left ventricular hypertrophy. Left ventricular diastolic parameters are indeterminate.  2. Right ventricular systolic function is moderately reduced. The right ventricular size is moderately enlarged. There is mildly elevated pulmonary artery systolic pressure.  3. Left atrial size was moderately dilated.  4. The mitral valve is abnormal. Moderate mitral valve regurgitation. No evidence of mitral stenosis.  5. Tricuspid valve regurgitation is moderate.  6. The aortic valve is tricuspid. There is moderate calcification of the aortic valve. There is moderate thickening of the aortic valve. Aortic valve regurgitation is not visualized. Aortic valve sclerosis/calcification is present, without any evidence of aortic stenosis.  7. Aortic dilatation noted. There is moderate dilatation of the ascending aorta, measuring 42 mm.  8. The inferior vena cava is dilated in size with <50% respiratory variability, suggesting right atrial pressure of 15 mmHg. FINDINGS  Left Ventricle: Left ventricular ejection fraction, by estimation, is 20 to  25%. The left ventricle has severely decreased function. The left ventricle demonstrates global hypokinesis. The left ventricular internal cavity size was moderately dilated. There is mild left ventricular hypertrophy. Left ventricular diastolic parameters are indeterminate. Right Ventricle: The right ventricular size is moderately enlarged. Right vetricular wall thickness was not assessed. Right  ventricular systolic function is moderately reduced. There is mildly elevated pulmonary artery systolic pressure. The tricuspid regurgitant velocity is 2.41 m/s, and with an assumed right atrial pressure of 15 mmHg, the estimated right ventricular systolic pressure is 03.2 mmHg. Left Atrium: Left atrial size was moderately dilated. Right Atrium: Right atrial size was normal in size. Pericardium: There is no evidence of pericardial effusion. Mitral Valve: The mitral valve is abnormal. There is moderate thickening of the mitral valve leaflet(s). Moderate mitral valve regurgitation. No evidence of mitral valve stenosis. Tricuspid Valve: The tricuspid valve is normal in structure. Tricuspid valve regurgitation is moderate . No evidence of tricuspid stenosis. Aortic Valve: The aortic valve is tricuspid. There is moderate calcification of the aortic valve. There is moderate thickening of the aortic valve. Aortic valve regurgitation is not visualized. Aortic valve sclerosis/calcification is present, without any  evidence of aortic stenosis. Pulmonic Valve: The pulmonic valve was normal in structure. Pulmonic valve regurgitation is mild. No evidence of pulmonic stenosis. Aorta: Aortic dilatation noted. There is moderate dilatation of the ascending aorta, measuring 42 mm. Venous: The inferior vena cava is dilated in size with less than 50% respiratory variability, suggesting right atrial pressure of 15 mmHg. IAS/Shunts: No atrial level shunt detected by color flow Doppler.  LEFT VENTRICLE PLAX 2D LVIDd:         6.10 cm      Diastology LVIDs:         5.30 cm      LV e' medial:    3.89 cm/s LV PW:         1.30 cm      LV E/e' medial:  30.6 LV IVS:        1.30 cm      LV e' lateral:   8.68 cm/s LVOT diam:     2.10 cm      LV E/e' lateral: 13.7 LV SV:         30 LV SV Index:   15 LVOT Area:     3.46 cm  LV Volumes (MOD) LV vol d, MOD A2C: 202.0 ml LV vol d, MOD A4C: 192.0 ml LV vol s, MOD A2C: 155.0 ml LV vol s, MOD A4C: 147.0  ml LV SV MOD A2C:     47.0 ml LV SV MOD A4C:     192.0 ml LV SV MOD BP:      52.3 ml RIGHT VENTRICLE            IVC RV Basal diam:  5.10 cm    IVC diam: 2.50 cm RV S prime:     6.83 cm/s TAPSE (M-mode): 1.1 cm LEFT ATRIUM              Index        RIGHT ATRIUM           Index LA diam:        4.30 cm  2.13 cm/m   RA Area:     29.70 cm LA Vol (A2C):   116.0 ml 57.41 ml/m  RA Volume:   99.60 ml  49.30 ml/m LA Vol (A4C):   97.7 ml  48.36 ml/m LA Biplane Vol:  108.0 ml 53.45 ml/m  AORTIC VALVE             PULMONIC VALVE LVOT Vmax:   67.10 cm/s  PR End Diast Vel: 10.24 msec LVOT Vmean:  43.000 cm/s LVOT VTI:    0.086 m  AORTA Ao Root diam: 3.70 cm Ao Asc diam:  4.20 cm MITRAL VALVE                  TRICUSPID VALVE MV Area (PHT): 4.26 cm       TR Peak grad:   23.2 mmHg MV Decel Time: 178 msec       TR Vmax:        241.00 cm/s MR Peak grad:    47.9 mmHg MR Mean grad:    30.0 mmHg    SHUNTS MR Vmax:         346.00 cm/s  Systemic VTI:  0.09 m MR Vmean:        252.0 cm/s   Systemic Diam: 2.10 cm MR PISA:         1.57 cm MR PISA Eff ROA: 15 mm MR PISA Radius:  0.50 cm MV E velocity: 119.00 cm/s Charlton Haws MD Electronically signed by Charlton Haws MD Signature Date/Time: 05/07/2022/2:32:22 PM    Final     Cardiac Studies   Decreased ejection fraction noted on echocardiogram  Patient Profile     72 y.o. 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  Plan is for TEE cardioversion on Friday.  Blood pressure has limited goal-directed medical therapy. Now off of Levophed.   Atrial flutter: Eliquis for stroke prevention.  Started recently so he will need TEE prior to cardioversion.  CAD: Status post CABG in 2020.  Influenza: Receiving Tamiflu.     For questions or updates, please contact [facility] Please consult [email or web address] for contact info under        Signed, Lance Muss, MD  05/09/2022, 9:18 AM

## 2022-05-09 NOTE — Progress Notes (Signed)
OT Cancellation Note  Patient Details Name: Jerry Myers MRN: [id number] DOB: January 28, 1951   Cancelled Treatment:    Reason Eval/Treat Not Completed: Pain limiting ability to participate (Upon arrival, pt being assisted back to the bed by the RN with complaints of stomach pain. OT treatment to f/u as able.)  Elliot Cousin 05/09/2022, 9:15 AM

## 2022-05-09 NOTE — Progress Notes (Signed)
Inpatient Diabetes Program Recommendations  AACE/ADA: New Consensus Statement on Inpatient Glycemic Control (2015)  Target Ranges:  Prepandial:   less than 140 mg/dL      Peak postprandial:   less than 180 mg/dL (1-2 hours)      Critically ill patients:  140 - 180 mg/dL   Lab Results  Component Value Date   GLUCAP 229 (H) 05/09/2022   HGBA1C 10.6 (H) 05/06/2022    Review of Glycemic Control  Latest Reference Range & Units 05/09/22 03:58 05/09/22 07:54 05/09/22 11:44  Glucose-Capillary 70 - 99 mg/dL 106 (H) 150 (H) 229 (H)  Diabetes history: DM 2 Outpatient Diabetes medications:  Farxiga 10 mg daily Lantus 20 units q HS Januvia 100 mg daily Metformin 1000 mg bid Current orders for Inpatient glycemic control:  Novolog 0-15 units q 4 hours Semglee 10 units bid Inpatient Diabetes Program Recommendations:    Note that patient will be transferring to floor.  Consider reducing frequency of Novolog correction to tid with meals and HS.  Also consider adding Novolog meal coverage 3 units tid with meals.   Thanks,  Adah Perl, RN, BC-ADM Inpatient Diabetes Coordinator Pager [phone]  (8a-5p)

## 2022-05-09 NOTE — Progress Notes (Signed)
Pt arrived to [facility] room 07 from [facility]. Received report from [employer]. Pt A&O x4, on room air, IV x2, foley cath. Pt oriented to room and unit. Vitals stable. Bed alarm turned on and call bell given to pt.

## 2022-05-09 NOTE — Progress Notes (Signed)
Occupational Therapy Treatment Patient Details Name: Jerry Myers MRN: [id number] DOB: Mar 03, 1951 Today's Date: 05/09/2022   History of present illness Pt is a 72 y.o. M who presents 05/05/2022 for evaluation of acute on chronic systolic heart failure and influenza A. Significant PMH: CAD s/p CABG x 4, DM, HTN, HLD.   OT comments  Pt is making incremental progress. He had complaints of abdominal pain this session and was limited to brief room ambulation. Overall he needed mod A for bed mobility and min A for mobility with RW. Pt declining participation in ADLs, and set up for lunch at the end of the session. VSS on RA, increased in SOB with minimal activity. OT to continue to follow. POC remains appropriate.    Recommendations for follow up therapy are one component of a multi-disciplinary discharge planning process, led by the attending physician.  Recommendations may be updated based on patient status, additional functional criteria and insurance authorization.    Follow Up Recommendations  Skilled nursing-short term rehab (<3 hours/day)     Assistance Recommended at Discharge Frequent or constant Supervision/Assistance  Patient can return home with the following  A little help with walking and/or transfers;A lot of help with bathing/dressing/bathroom;Assistance with cooking/housework;Assist for transportation;Help with stairs or ramp for entrance   Equipment Recommendations  BSC/3in1;Other (comment)       Precautions / Restrictions Precautions Precautions: Fall Restrictions Weight Bearing Restrictions: No       Mobility Bed Mobility Overal bed mobility: Needs Assistance Bed Mobility: Supine to Sit     Supine to sit: Mod assist          Transfers Overall transfer level: Needs assistance Equipment used: Rolling walker (2 wheels) Transfers: Sit to/from Stand Sit to Stand: Min assist                 Balance Overall balance assessment: Needs  assistance Sitting-balance support: Feet supported Sitting balance-Leahy Scale: Fair     Standing balance support: Bilateral upper extremity supported Standing balance-Leahy Scale: Poor Standing balance comment: reliant on RW                           ADL either performed or assessed with clinical judgement   ADL Overall ADL's : Needs assistance/impaired                         Toilet Transfer: Minimal assistance;Ambulation;Rolling walker (2 wheels) Toilet Transfer Details (indicate cue type and reason): cues for hand placement and assist to power up         Functional mobility during ADLs: Minimal assistance;Cueing for safety;Cueing for sequencing;Rolling walker (2 wheels) General ADL Comments: self limiting, declining ADLs    Extremity/Trunk Assessment Upper Extremity Assessment Upper Extremity Assessment: Generalized weakness   Lower Extremity Assessment Lower Extremity Assessment: Generalized weakness        Vision   Vision Assessment?: No apparent visual deficits          Cognition Arousal/Alertness: Awake/alert Behavior During Therapy: Flat affect Overall Cognitive Status: No family/caregiver present to determine baseline cognitive functioning                                 General Comments: required simple 1 step commands, poor problem solving and safety awareness. Poor insight and self monitoring. not oriented to day of week. self-limiting this session  Exercises      Shoulder Instructions       General Comments VSS on RA, SOB with minimal activiyty    Pertinent Vitals/ Pain       Pain Assessment Pain Assessment: Faces Faces Pain Scale: Hurts little more Pain Location: stomach Pain Descriptors / Indicators: Grimacing Pain Intervention(s): Limited activity within patient's tolerance, Monitored during session   Frequency  Min 2X/week        Progress Toward Goals  OT Goals(current goals can now  be found in the care plan section)  Progress towards OT goals: Progressing toward goals  Acute Rehab OT Goals Patient Stated Goal: to feel better OT Goal Formulation: With patient Time For Goal Achievement: 05/20/22 Potential to Achieve Goals: Good ADL Goals Pt Will Perform Grooming: with modified independence;standing Pt Will Perform Lower Body Dressing: with modified independence;sit to/from stand Pt Will Transfer to Toilet: with modified independence;ambulating Additional ADL Goal #1: Pt will indep complete IADL medicaiton management task  Plan         AM-PAC OT "6 Clicks" Daily Activity     Outcome Measure   Help from another person eating meals?: A Little Help from another person taking care of personal grooming?: A Little Help from another person toileting, which includes using toliet, bedpan, or urinal?: A Little Help from another person bathing (including washing, rinsing, drying)?: A Lot Help from another person to put on and taking off regular upper body clothing?: A Little Help from another person to put on and taking off regular lower body clothing?: A Lot 6 Click Score: 16    End of Session Equipment Utilized During Treatment: Gait belt;Rolling walker (2 wheels)  OT Visit Diagnosis: Unsteadiness on feet (R26.81);Other abnormalities of gait and mobility (R26.89);Muscle weakness (generalized) (M62.81);History of falling (Z91.81)   Activity Tolerance Patient tolerated treatment well   Patient Left in chair;with call bell/phone within reach   Nurse Communication Mobility status        Time: 1962-2297 OT Time Calculation (min): 16 min  Charges: OT General Charges $OT Visit: 1 Visit OT Treatments $Self Care/Home Management : 8-22 mins    Elliot Cousin 05/09/2022, 1:13 PM

## 2022-05-09 NOTE — Progress Notes (Signed)
NAME:  Jerry Myers, MRN:  [id number], DOB:  February 22, 1951, LOS: 4 ADMISSION DATE:  05/05/2022, CONSULTATION DATE:  05/05/2022 REFERRING MD:  Dr. Nevada Myers, [facility], CHIEF COMPLAINT:  Short of breath   History of Present Illness:  72 yo male was at [facility] for CHF exacerbation and discharged home on 05/05/22 per patient request.  Family immediately called 911 and he was brought to [facility] ER.  He had dyspnea and leg swelling.  He was hypotensive and had fever.  He was started on Bipap in the ER, but patient requested to have this taken off.  Cardiology consulted by EDP.  Patient was confused and not able to provide complete medical history.  Due to respiratory distress, fever, and hypotension PCCM asked to assess for ICU admission.  Pertinent  Medical History  Anxiety, Arthritis, A flutter, CAD s/p CABG, Combined CHF, Depression, HTN, GERD, Nephrolithiasis, NSVT, DM type 2  Significant Hospital Events: Including procedures, antibiotic start and stop dates in addition to other pertinent events   1/13 admit, cardiology consulted, start Abx 1/14 started on tamiflu for flu. NE  Interim History / Subjective:  Vasopressors titrated off Patient stated feeling much better Converted to sinus rhythm  Objective   Blood pressure 95/78, pulse (!) 109, temperature 98.1 F (36.7 C), temperature source Axillary, resp. rate (!) 22, height 5\' 8"  (1.727 m), weight 88.3 kg, SpO2 97 %.        Intake/Output Summary (Last 24 hours) at 05/09/2022 8938 Last data filed at 05/09/2022 0800 Gross per 24 hour  Intake 568.42 ml  Output 2811 ml  Net -2242.58 ml   Filed Weights   05/05/22 1820 05/06/22 0015  Weight: 102.1 kg 88.3 kg    Examination: Physical exam: General: Elderly male, lying on the bed HEENT: [place]/AT, eyes anicteric.  moist mucus membranes Neuro: Alert, awake following commands Chest: Coarse breath sounds, no wheezes or rhonchi Heart: Regular rate and rhythm, no murmurs or gallops Abdomen:  Soft, nontender, nondistended, bowel sounds present Skin: No rash  Resolved Hospital Problem list   Lactic acidosis, resolved Septic Shock  Assessment & Plan:  Acute hypoxic respiratory failure, resolved Sepsis with septic shock due to flu PNA R pleural effusion Oxygen was titrated off, currently patient is on room air Continue droplet precautions Levophed was titrated off Continue midodrine 10 mg 3 times daily, titrate up as blood pressure allows Continue Tamiflu  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 Paroxysmal Afib/flutter CAD s/p CABG Patient repeat echocardiogram showed EF of 25% with right ventricular dysfunction Continue aggressive diuresis with Lasix, monitor intake and output He is requiring vasopressor support, unable to start [place] [facility] is following Converted to sinus rhythm Switch IV amiodarone to p.o. 200 mg twice daily Continue Eliquis for stroke prophylaxis  AKI on CKD stage IIIa Hypokalemia, resolved Hyponatremia Serum creatinine continue to improve Monitor intake and output Avoid nephrotoxic agents Closely monitor electrolytes and supplement  Acute septic encephalopathy Mental status is back to baseline  Poorly controlled diabetes type 2 with hyperglycemia Patient blood sugars remain labile His hemoglobin A1c is 10.6 Continue Semglee 10 units twice daily Continue sliding scale with CBG goal 140-180  Best Practice (right click and "Reselect all SmartList Selections" daily)   Diet/type: Consistent carbohydrate diet DVT prophylaxis: DOAC GI prophylaxis: N/A Lines: N/A Foley:  Yes, and it is still needed Code Status:  full code Last date of multidisciplinary goals of care discussion [1/16: Patient was updated at bedside]  Labs   CBC: Recent Labs  Lab 05/05/22 1635 05/05/22 2119 05/06/22 0235 05/06/22 1316 05/07/22 0731  WBC 5.6  --  5.7 7.0 4.7  NEUTROABS 3.9  --  4.2  --   --   HGB 13.2 13.6 12.1* 13.4 12.5*  HCT 40.9 40.0  35.5* 41.5 36.7*  MCV 91.3  --  89.2 91.6 89.5  PLT 271  --  220 233 161    Basic Metabolic Panel: Recent Labs  Lab 05/06/22 0235 05/06/22 1316 05/07/22 0731 05/08/22 0430 05/09/22 0703  NA 142 137 136 135 134*  K 3.1* 3.9 3.1* 3.5 3.7  CL 104 102 102 99 99  CO2 25 21* 26 23 23   GLUCOSE 156* 203* 93 102* 108*  BUN 29* 32* 36* 31* 29*  CREATININE 1.72* 1.73* 1.59* 1.35* 1.35*  CALCIUM 8.4* 8.3* 8.0* 8.2* 8.3*  MG 1.7 2.1 2.1  --   --   PHOS 4.9*  --  3.8  --   --    GFR: Estimated Creatinine Clearance: 51.9 mL/min (A) (by C-G formula based on SCr of 1.35 mg/dL (H)). Recent Labs  Lab 05/05/22 1635 05/05/22 1730 05/05/22 2110 05/06/22 0235 05/06/22 1316 05/07/22 0731  WBC 5.6  --   --  5.7 7.0 4.7  LATICACIDVEN  --  2.9* 2.5* 1.0  --   --     Liver Function Tests: Recent Labs  Lab 05/05/22 1635 05/06/22 0235 05/06/22 1316  AST 25 18 30   ALT 19 19 25   ALKPHOS 70 62 68  BILITOT 1.0 0.7 0.6  PROT 6.4* 5.6* 6.5  ALBUMIN 3.5 3.2* 3.4*   No results for input(s): "LIPASE", "AMYLASE" in the last 168 hours. No results for input(s): "AMMONIA" in the last 168 hours.  ABG    Component Value Date/Time   PHART 7.435 09/07/2018 0440   PCO2ART 34.1 09/07/2018 0440   PO2ART 64.0 (L) 09/07/2018 0440   HCO3 22.3 05/05/2022 2119   TCO2 23 05/05/2022 2119   ACIDBASEDEF 1.0 09/07/2018 0440   O2SAT 75 05/05/2022 2119     Coagulation Profile: No results for input(s): "INR", "PROTIME" in the last 168 hours.  Cardiac Enzymes: No results for input(s): "CKTOTAL", "CKMB", "CKMBINDEX", "TROPONINI" in the last 168 hours.  HbA1C: Hgb A1c MFr Bld  Date/Time Value Ref Range Status  05/06/2022 02:35 AM 10.6 (H) 4.8 - 5.6 % Final    Comment:    (NOTE) Pre diabetes:          5.7%-6.4%  Diabetes:              >6.4%  Glycemic control for   <7.0% adults with diabetes   12/31/2019 11:05 AM 13.4 (H) 4.8 - 5.6 % Final    Comment:             Prediabetes: 5.7 - 6.4           Diabetes: >6.4          Glycemic control for adults with diabetes: <7.0     CBG: Recent Labs  Lab 05/08/22 1553 05/08/22 1946 05/08/22 2339 05/09/22 0358 05/09/22 0754  GLUCAP 152* 160* 204* 106* 150*      Jerry Kindle, MD [facility] See Amion for pager If no response to pager, please call [phone] until 7pm After 7pm, Please call E-link [phone]

## 2022-05-10 ENCOUNTER — Encounter (HOSPITAL_COMMUNITY): Payer: Self-pay | Admitting: Pulmonary Disease

## 2022-05-10 DIAGNOSIS — I5023 Acute on chronic systolic (congestive) heart failure: Secondary | ICD-10-CM | POA: Diagnosis not present

## 2022-05-10 LAB — BASIC METABOLIC PANEL
Anion gap: 12 (ref 5–15)
BUN: 26 mg/dL — ABNORMAL HIGH (ref 8–23)
CO2: 26 mmol/L (ref 22–32)
Calcium: 8.5 mg/dL — ABNORMAL LOW (ref 8.9–10.3)
Chloride: 99 mmol/L (ref 98–111)
Creatinine, Ser: 1.42 mg/dL — ABNORMAL HIGH (ref 0.61–1.24)
GFR, Estimated: 52 mL/min — ABNORMAL LOW (ref >60)
Glucose, Bld: 105 mg/dL — ABNORMAL HIGH (ref 70–99)
Potassium: 3.2 mmol/L — ABNORMAL LOW (ref 3.5–5.1)
Sodium: 137 mmol/L (ref 135–145)

## 2022-05-10 LAB — GLUCOSE, CAPILLARY
Glucose-Capillary: 105 mg/dL — ABNORMAL HIGH (ref 70–99)
Glucose-Capillary: 145 mg/dL — ABNORMAL HIGH (ref 70–99)
Glucose-Capillary: 209 mg/dL — ABNORMAL HIGH (ref 70–99)
Glucose-Capillary: 215 mg/dL — ABNORMAL HIGH (ref 70–99)
Glucose-Capillary: 258 mg/dL — ABNORMAL HIGH (ref 70–99)

## 2022-05-10 LAB — CULTURE, BLOOD (ROUTINE X 2)
Culture: NO GROWTH
Culture: NO GROWTH
Special Requests: ADEQUATE
Special Requests: ADEQUATE

## 2022-05-10 MED ORDER — INSULIN ASPART 100 UNIT/ML IJ SOLN
0.0000 [IU] | Freq: Three times a day (TID) | INTRAMUSCULAR | Status: DC
  Administered 2022-05-10: 5 [IU] via SUBCUTANEOUS
  Administered 2022-05-11 (×3): 2 [IU] via SUBCUTANEOUS
  Administered 2022-05-12: 1 [IU] via SUBCUTANEOUS
  Administered 2022-05-12: 2 [IU] via SUBCUTANEOUS
  Administered 2022-05-12 – 2022-05-13 (×2): 3 [IU] via SUBCUTANEOUS
  Administered 2022-05-14: 2 [IU] via SUBCUTANEOUS
  Administered 2022-05-14: 3 [IU] via SUBCUTANEOUS
  Administered 2022-05-14 – 2022-05-15 (×2): 1 [IU] via SUBCUTANEOUS
  Administered 2022-05-15 (×2): 2 [IU] via SUBCUTANEOUS

## 2022-05-10 MED ORDER — POTASSIUM CHLORIDE CRYS ER 20 MEQ PO TBCR
40.0000 meq | EXTENDED_RELEASE_TABLET | Freq: Two times a day (BID) | ORAL | Status: AC
  Administered 2022-05-10 (×2): 40 meq via ORAL
  Filled 2022-05-10 (×2): qty 2

## 2022-05-10 NOTE — Progress Notes (Signed)
Heart Failure Nurse Navigator Progress Note  PCP: Patient, No Pcp Per PCP-Cardiologist: Ravenkhar, MD Admission Diagnosis: A/C CHF Admitted from: transfer from [facility]. Home alone.   Presentation:   Jerry Myers presented as transfer from [facility] in shock, admitted to ICU over the weekend, on levophed. Now in SDU, on midodrine, levo off 1/17. BP maintaining low 100sys. Found to be in AF/flutter planned DCCV tomorrow 1/19. Pt states he does not read. Highest level of education--8th grade. Has 3 stepchildren, widowed. Pt lives at home alone, has housekeeper that helps set up his medications. Pt states he trusts her. Cousin present at bedside, states he or family will help him get to appointments. Pt has not seen Dr. Beaulah Dinning since his CABG.  Pt agreeable to HV TOC clinic appt--delayed to Monday 1/29 per pt request. Pt states his medications are free/affordable as of now. Received disability. Eats out often.  No teeth at bedside- request soft diet.  Navigator kept education simple--felt patient was getting overwhlemed. Encouraged pt to take medications from the list we will provide upon DC (pt states he also has medications from [facility]) and come to [facility] TOC appt.   Informed HF pharmacist of SDOH needs---would benefit from ALL medications to be sent to [place] prior to DC and medication box set up by HF Navigation team if possible. Significant concern for incidental medication compliance.   ECHO/ LVEF: 20%  Clinical Course:  Past Medical History:  Diagnosis Date   Abnormal nuclear cardiac imaging test 06/03/2018   Anxiety    Arthritis    Atrial flutter by electrocardiogram ([place]) 09/09/2018   Bradycardia 03/18/2018   CAD (coronary artery disease) 01/26/2019   Cardiomyopathy ([place]) 04/19/2015   Ejection fraction 4045% in the fall of 2016   Cataract    right eye   Chest pain 06/05/2018   Coronary artery disease    Coronary artery disease of native artery  of native heart with stable angina pectoris ([place]) 03/18/2015   Depression    Dyspnea    Essential hypertension 12/29/2014   GERD (gastroesophageal reflux disease)    History of kidney stones    20 yrs. 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     Social History   Socioeconomic History   Marital status: Widowed    Spouse name: Not on file   Number of children: 3   Years of education: Not on file   Highest education level: 8th grade  Occupational History   Occupation: retired    Comment: Biomedical scientist  Tobacco Use   Smoking status: Never   Smokeless tobacco: Never  Vaping Use   Vaping Use: Never used  Substance and Sexual Activity   Alcohol use: Never   Drug use: Not Currently   Sexual activity: Not on file  Other Topics Concern   Not on file  Social History Narrative   Not on file   Social Determinants of Health   Financial Resource Strain: Medium Risk (05/10/2022)   Overall Financial Resource Strain (CARDIA)    Difficulty of Paying Living Expenses: Somewhat hard  Food Insecurity: No Food Insecurity (05/06/2022)   Hunger Vital Sign    Worried About Running Out of Food in the Last Year: Never true    Ran Out of Food in the Last Year: Never true  Transportation Needs: No Transportation Needs (05/10/2022)  PRAPARE - Hydrologist (Medical): No    Lack of Transportation (Non-Medical): No  Physical Activity: Not on file  Stress: Not on file  Social Connections: Not on file    High Risk Criteria for Readmission and/or Poor Patient Outcomes: Heart failure hospital admissions (last 6 months): 2  No Show rate: NA Difficult social situation: YES Demonstrates medication adherence: Questionable.  Primary Language: English  Literacy level: low, unable to read. High concern for safe self-care. Wears  prescription glasses.  Barriers of Care:   -compliance? -literacy -cardiology plan -education  Considerations/Referrals:   Referral made to Heart Failure Pharmacist Stewardship: yes, appreciated Referral made to Heart Failure CSW/NCM TOC: yes, to see at [place] clinic Referral made to Heart & Vascular TOC clinic: yes, 1/29 @ noon.  Items for Follow-up on DC/TOC: -optimize -cardiology f/u -cont HF education (eats out often) -[place] services?  Medication set up?   Pricilla Holm, MSN, RN Heart Failure Nurse Navigator

## 2022-05-10 NOTE — Plan of Care (Signed)
Pt walked in hallway with therapy. Pt still requiring 2L [place] while asleep.  Problem: Education: Goal: Ability to describe self-care measures that may prevent or decrease complications (Diabetes Survival Skills Education) will improve Outcome: Progressing Goal: Individualized Educational Video(s) Outcome: Progressing   Problem: Coping: Goal: Ability to adjust to condition or change in health will improve Outcome: Progressing   Problem: Fluid Volume: Goal: Ability to maintain a balanced intake and output will improve Outcome: Progressing   Problem: Health Behavior/Discharge Planning: Goal: Ability to identify and utilize available resources and services will improve Outcome: Progressing Goal: Ability to manage health-related needs will improve Outcome: Progressing   Problem: Metabolic: Goal: Ability to maintain appropriate glucose levels will improve Outcome: Progressing   Problem: Nutritional: Goal: Maintenance of adequate nutrition will improve Outcome: Progressing Goal: Progress toward achieving an optimal weight will improve Outcome: Progressing   Problem: Skin Integrity: Goal: Risk for impaired skin integrity will decrease Outcome: Progressing   Problem: Tissue Perfusion: Goal: Adequacy of tissue perfusion will improve Outcome: Progressing   Problem: Education: Goal: Knowledge of General Education information will improve Description: Including pain rating scale, medication(s)/side effects and non-pharmacologic comfort measures Outcome: Progressing   Problem: Health Behavior/Discharge Planning: Goal: Ability to manage health-related needs will improve Outcome: Progressing   Problem: Clinical Measurements: Goal: Ability to maintain clinical measurements within normal limits will improve Outcome: Progressing Goal: Will remain free from infection Outcome: Progressing Goal: Diagnostic test results will improve Outcome: Progressing Goal: Respiratory complications  will improve Outcome: Progressing Goal: Cardiovascular complication will be avoided Outcome: Progressing   Problem: Activity: Goal: Risk for activity intolerance will decrease Outcome: Progressing   Problem: Nutrition: Goal: Adequate nutrition will be maintained Outcome: Progressing   Problem: Coping: Goal: Level of anxiety will decrease Outcome: Progressing   Problem: Elimination: Goal: Will not experience complications related to bowel motility Outcome: Progressing Goal: Will not experience complications related to urinary retention Outcome: Progressing   Problem: Pain Managment: Goal: General experience of comfort will improve Outcome: Progressing   Problem: Safety: Goal: Ability to remain free from injury will improve Outcome: Progressing   Problem: Skin Integrity: Goal: Risk for impaired skin integrity will decrease Outcome: Progressing

## 2022-05-10 NOTE — Progress Notes (Signed)
Physical Therapy Treatment Patient Details Name: Jerry Myers MRN: [id number] DOB: [birth year] Today's Date: 05/10/2022   History of Present Illness Pt is a 72 y.o. M who presents 05/05/2022 for evaluation of acute on chronic systolic heart failure and influenza A. Significant PMH: CAD s/p CABG x 4, DM, HTN, HLD.    PT Comments    Pt seen for PT tx with pt received sitting EOB & agreeable to tx. Pt eager to d/c home vs SNF so encouraged pt to complete all activities without assistance. Pt attempts to don socks sitting EOB with BLE in figure four position but pt unable & requires total assist from PT. Pt is able to transfer STS with supervision with cuing for hand placement but poor return demo. Pt ambulates into hallway with RW & supervision with impaired gait pattern as noted below. Pt with c/o "I can't breathe" & RR increased to 53; PT educated pt on pursed lip breathing & slowed breathing with good return demo but does require ongoing cuing throughout gait (nurse made aware of RR). Back in room, pt attempted bed mobility with bed flat without rails to simulate home environment. Pt is able to transition supine but unable to reposition in bed, then unable to transition supine>sit without bed rails or assistance from PT. PT educated pt on benefits of SNF rehab upon d/c & risks of d/c home alone with only PRN assist from aide. Continue to recommend STR upon d/c at this time.    Recommendations for follow up therapy are one component of a multi-disciplinary discharge planning process, led by the attending physician.  Recommendations may be updated based on patient status, additional functional criteria and insurance authorization.  Follow Up Recommendations  Skilled nursing-short term rehab (<3 hours/day) Can patient physically be transported by private vehicle: Yes   Assistance Recommended at Discharge Frequent or constant Supervision/Assistance  Patient can return home with the following A  little help with walking and/or transfers;A little help with bathing/dressing/bathroom;Assistance with cooking/housework;Assist for transportation;Help with stairs or ramp for entrance   Equipment Recommendations  Rolling walker (2 wheels)    Recommendations for Other Services       Precautions / Restrictions Precautions Precautions: Fall Restrictions Weight Bearing Restrictions: No     Mobility  Bed Mobility Overal bed mobility: Needs Assistance Bed Mobility: Supine to Sit, Sit to Supine     Supine to sit: Min assist (bed flat with use of bed rails, requires min assist to ultimately complete supine>sit) Sit to supine: Supervision (Pt is able to transition sit>supine with bed flat without rails, but unable to center body in bed nor scoot to [facility].)        Transfers Overall transfer level: Needs assistance Equipment used: Rolling walker (2 wheels) Transfers: Sit to/from Stand Sit to Stand: Supervision           General transfer comment: extra time to power up to standing, cuing & poor demo for safe hand placement during STS    Ambulation/Gait Ambulation/Gait assistance: Supervision, Min guard Gait Distance (Feet): 120 Feet Assistive device: Rolling walker (2 wheels) Gait Pattern/deviations: Decreased step length - right, Decreased step length - left, Decreased dorsiflexion - right, Decreased dorsiflexion - left, Decreased stride length Gait velocity: decreased     General Gait Details: Decreased heel strike BLE, cuing for upright posture & forward gaze with fair return Scientist, forensic    Modified Rankin (  Stroke Patients Only)       Balance Overall balance assessment: Needs assistance Sitting-balance support: Feet supported Sitting balance-Leahy Scale: Fair Sitting balance - Comments: supervision static sitting   Standing balance support: Bilateral upper extremity supported, Reliant on assistive device for balance,  During functional activity Standing balance-Leahy Scale: Fair                              Cognition Arousal/Alertness: Awake/alert Behavior During Therapy: Flat affect Overall Cognitive Status: No family/caregiver present to determine baseline cognitive functioning                                 General Comments: Pt with decreased awareness re: safety, decreased anticipatory awareness.        Exercises      General Comments General comments (skin integrity, edema, etc.): Pt on room air throughout session with SpO2 >90%, RR up to 53 during gait, lowest is 21 during session -- nurse made aware & PT educated pt on pursed lip breathing & slow breathing with increased RR      Pertinent Vitals/Pain Pain Assessment Pain Assessment: Faces Faces Pain Scale: No hurt    Home Living                          Prior Function            PT Goals (current goals can now be found in the care plan section) Acute Rehab PT Goals Patient Stated Goal: to go home PT Goal Formulation: With patient Time For Goal Achievement: 05/20/22 Potential to Achieve Goals: Fair Progress towards PT goals: Progressing toward goals    Frequency    Min 3X/week      PT Plan Current plan remains appropriate    Co-evaluation              AM-PAC PT "6 Clicks" Mobility   Outcome Measure  Help needed turning from your back to your side while in a flat bed without using bedrails?: A Little Help needed moving from lying on your back to sitting on the side of a flat bed without using bedrails?: A Little Help needed moving to and from a bed to a chair (including a wheelchair)?: A Little Help needed standing up from a chair using your arms (e.g., wheelchair or bedside chair)?: A Little Help needed to walk in hospital room?: A Little Help needed climbing 3-5 steps with a railing? : A Lot 6 Click Score: 17    End of Session   Activity Tolerance: Patient tolerated  treatment well;Patient limited by fatigue Patient left: with call bell/phone within reach;with bed alarm set (sitting EOB) Nurse Communication: Mobility status (O2, RR during session) PT Visit Diagnosis: Unsteadiness on feet (R26.81);Muscle weakness (generalized) (M62.81);Difficulty in walking, not elsewhere classified (R26.2)     Time: 3267-1245 PT Time Calculation (min) (ACUTE ONLY): 27 min  Charges:  $Therapeutic Activity: 23-37 mins                     Lavone Nian, PT, DPT 05/10/22, 2:34 PM   Waunita Schooner 05/10/2022, 2:31 PM

## 2022-05-10 NOTE — TOC Initial Note (Addendum)
Transition of Care [facility]) - Initial/Assessment Note    Patient Details  Name: Jerry Myers MRN: [id number] Date of Birth: 11/06/1950  [facility]) CM/SW Contact:    Bjorn Pippin, LCSW Phone Number: 05/10/2022, 12:14 PM  Clinical Narrative:                 CSW spoke with pt via phone. CSW explained to pt the recommendation for SNF. Pt fully understood and refused SNF. Pt stated he wants to go home when he dc from the hospital. CSW contacted PT to see if pt was was appropriate for home health PT. PT will inform CSW if pt is appropiate for homehealth PT once she visits him this afternoon. TOC will continue to follow.    05/10/2022 14:40 PT informed CSW that if pt refuses SNF, to set him up with home health PT/OT.        Patient Goals and CMS Choice Patient states their goals for this hospitalization and ongoing recovery are:: Pt stated they would like to go home when they leave the hospital.          Expected Discharge Plan and Services                                              Prior Living Arrangements/Services   Lives with:: Self Patient language and need for interpreter reviewed:: No Do you feel safe going back to the place where you live?: Yes      Need for Family Participation in Patient Care: Yes (Comment) Care giver support system in place?: Yes (comment) Current home services: DME Criminal Activity/Legal Involvement Pertinent to Current Situation/Hospitalization: No - Comment as needed  Activities of Daily Living Home Assistive Devices/Equipment: Cane (specify quad or straight) ADL Screening (condition at time of admission) Patient's cognitive ability adequate to safely complete daily activities?: Yes Is the patient deaf or have difficulty hearing?: No Does the patient have difficulty seeing, even when wearing glasses/contacts?: No Does the patient have difficulty concentrating, remembering, or making decisions?: No Patient able  to express need for assistance with ADLs?: Yes Does the patient have difficulty dressing or bathing?: No Independently performs ADLs?: Yes (appropriate for developmental age) Does the patient have difficulty walking or climbing stairs?: No Weakness of Legs: Both Weakness of Arms/Hands: None  Permission Sought/Granted                  Emotional Assessment       Orientation: : Oriented to Self, Oriented to Place, Oriented to  Time, Oriented to Situation   Psych Involvement: No (comment)  Admission diagnosis:  Acute on chronic systolic (congestive) heart failure (HCC) [I50.23] Acute on chronic systolic CHF (congestive heart failure) ([place]) [I50.23] Patient Active Problem List   Diagnosis Date Noted   Acute on chronic congestive heart failure ([place]) 05/06/2022   Septic shock ([place]) 05/06/2022   Influenzal pneumonia 05/06/2022   Acute on chronic systolic (congestive) heart failure ([place]) 05/05/2022   Acute on chronic systolic CHF (congestive heart failure) ([place]) 05/05/2022   History of kidney stones    GERD (gastroesophageal reflux disease)    Dyspnea    Depression    Coronary artery disease    Cataract    Arthritis    Anxiety    CAD (coronary artery disease) 01/26/2019   Atrial flutter by electrocardiogram ([place]) 09/09/2018  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   Chest pain 06/05/2018   Abnormal nuclear cardiac imaging test 06/03/2018   NSVT (nonsustained ventricular tachycardia) ([place]) 06/03/2018   Bradycardia 03/18/2018   Cardiomyopathy ([place]) 04/19/2015   Coronary artery disease of native artery of native heart with stable angina pectoris ([place]) 03/18/2015   Essential hypertension 12/29/2014   Type 2 diabetes mellitus without complication ([place]) 86[birth year]1950   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 [address] Phone: [phone] Fax: [phone]     Social Determinants of Health (SDOH) Social History: SDOH Screenings   Food Insecurity: No Food Insecurity (05/06/2022)  Housing: Low Risk  (05/06/2022)  Transportation Needs: No Transportation Needs (05/06/2022)  Utilities: Not At Risk (05/06/2022)  Tobacco Use: Low Risk  (05/05/2022)   SDOH Interventions:     Readmission Risk Interventions     No data to display         Beckey Rutter, MSW, LCSWA, LCASA Transitions of Care  Clinical Social Worker I

## 2022-05-10 NOTE — Progress Notes (Addendum)
Rounding Note    Patient Name: Jerry Myers Date of Encounter: 05/10/2022  [place] Cardiologist: None   Subjective   No complaints this morning.   Inpatient Medications    Scheduled Meds:  amiodarone  200 mg Oral BID   apixaban  5 mg Oral BID   Chlorhexidine Gluconate Cloth  6 each Topical Daily   furosemide  80 mg Intravenous BID   insulin aspart  0-15 Units Subcutaneous Q4H   insulin glargine-yfgn  10 Units Subcutaneous BID   midodrine  10 mg Oral TID WC   oseltamivir  30 mg Oral BID   potassium chloride  40 mEq Oral BID   Continuous Infusions:  sodium chloride     PRN Meds: acetaminophen, mouth rinse   Vital Signs    Vitals:   05/09/22 1807 05/09/22 1947 05/09/22 2357 05/10/22 0949  BP: 119/87 100/78 (!) 148/114 103/78  Pulse: 91 (!) 106  (!) 110  Resp: (!) 34 (!) 30  19  Temp: 97.7 F (36.5 C) 97.8 F (36.6 C) 98.1 F (36.7 C) 97.7 F (36.5 C)  TempSrc: Oral Oral Oral Oral  SpO2: 97% 97%  94%  Weight:      Height:        Intake/Output Summary (Last 24 hours) at 05/10/2022 1036 Last data filed at 05/10/2022 0949 Gross per 24 hour  Intake --  Output 2825 ml  Net -2825 ml      05/06/2022   12:15 AM 05/05/2022    6:20 PM 02/18/2020    3:24 PM  Last 3 Weights  Weight (lbs) 194 lb 10.7 oz 225 lb 212 lb 9.6 oz  Weight (kg) 88.3 kg 102.059 kg 96.435 kg      Telemetry    Atrial flutter, rates 100s - Personally Reviewed  ECG    No new tracing  Physical Exam   GEN: No acute distress. Sitting up in bed. Neck: No JVD Cardiac: Tachy, no murmurs, rubs, or gallops.  Respiratory: Clear to auscultation bilaterally. GI: Soft, nontender, non-distended  MS: No edema; No deformity. Neuro:  Nonfocal  Psych: Normal affect   Labs    High Sensitivity Troponin:   Recent Labs  Lab 05/05/22 1635 05/05/22 2110  TROPONINIHS 93* 83*     Chemistry Recent Labs  Lab 05/05/22 1635 05/05/22 2119 05/06/22 0235 05/06/22 1316  05/07/22 0731 05/08/22 0430 05/09/22 0703 05/10/22 0100  NA 140   < > 142 137 136 135 134* 137  K 3.6   < > 3.1* 3.9 3.1* 3.5 3.7 3.2*  CL 102  --  104 102 102 99 99 99  CO2 24  --  25 21* 26 23 23 26   GLUCOSE 189*  --  156* 203* 93 102* 108* 105*  BUN 27*  --  29* 32* 36* 31* 29* 26*  CREATININE 1.70*  --  1.72* 1.73* 1.59* 1.35* 1.35* 1.42*  CALCIUM 8.9  --  8.4* 8.3* 8.0* 8.2* 8.3* 8.5*  MG  --   --  1.7 2.1 2.1  --   --   --   PROT 6.4*  --  5.6* 6.5  --   --   --   --   ALBUMIN 3.5  --  3.2* 3.4*  --   --   --   --   AST 25  --  18 30  --   --   --   --   ALT 19  --  19 25  --   --   --   --  ALKPHOS 70  --  62 68  --   --   --   --   BILITOT 1.0  --  0.7 0.6  --   --   --   --   GFRNONAA 42*  --  41* 41* 46* 55* 55* 52*  ANIONGAP 14  --  13 14 8 13 12 12   < > = values in this interval not displayed.    Lipids No results for input(s): "CHOL", "TRIG", "HDL", "LABVLDL", "LDLCALC", "CHOLHDL" in the last 168 hours.  Hematology Recent Labs  Lab 05/06/22 0235 05/06/22 1316 05/07/22 0731  WBC 5.7 7.0 4.7  RBC 3.98* 4.53 4.10*  HGB 12.1* 13.4 12.5*  HCT 35.5* 41.5 36.7*  MCV 89.2 91.6 89.5  MCH 30.4 29.6 30.5  MCHC 34.1 32.3 34.1  RDW 16.0* 16.0* 15.6*  PLT 220 233 225   Thyroid No results for input(s): "TSH", "FREET4" in the last 168 hours.  BNP Recent Labs  Lab 05/05/22 1635 05/06/22 1316  BNP 1,029.1* 955.5*    DDimer No results for input(s): "DDIMER" in the last 168 hours.   Radiology    No results found.  Cardiac Studies   Echo: 05/07/2022  IMPRESSIONS     1. Left ventricular ejection fraction, by estimation, is 20 to 25%. The  left ventricle has severely decreased function. The left ventricle  demonstrates global hypokinesis. The left ventricular internal cavity size  was moderately dilated. There is mild  left ventricular hypertrophy. Left ventricular diastolic parameters are  indeterminate.   2. Right ventricular systolic function is  moderately reduced. The right  ventricular size is moderately enlarged. There is mildly elevated  pulmonary artery systolic pressure.   3. Left atrial size was moderately dilated.   4. The mitral valve is abnormal. Moderate mitral valve regurgitation. No  evidence of mitral stenosis.   5. Tricuspid valve regurgitation is moderate.   6. The aortic valve is tricuspid. There is moderate calcification of the  aortic valve. There is moderate thickening of the aortic valve. Aortic  valve regurgitation is not visualized. Aortic valve  sclerosis/calcification is present, without any evidence  of aortic stenosis.   7. Aortic dilatation noted. There is moderate dilatation of the ascending  aorta, measuring 42 mm.   8. The inferior vena cava is dilated in size with <50% respiratory  variability, suggesting right atrial pressure of 15 mmHg.   FINDINGS   Left Ventricle: Left ventricular ejection fraction, by estimation, is 20  to 25%. The left ventricle has severely decreased function. The left  ventricle demonstrates global hypokinesis. The left ventricular internal  cavity size was moderately dilated.  There is mild left ventricular hypertrophy. Left ventricular diastolic  parameters are indeterminate.   Right Ventricle: The right ventricular size is moderately enlarged. Right  vetricular wall thickness was not assessed. Right ventricular systolic  function is moderately reduced. There is mildly elevated pulmonary artery  systolic pressure. The tricuspid  regurgitant velocity is 2.41 m/s, and with an assumed right atrial  pressure of 15 mmHg, the estimated right ventricular systolic pressure is  38.2 mmHg.   Left Atrium: Left atrial size was moderately dilated.   Right Atrium: Right atrial size was normal in size.   Pericardium: There is no evidence of pericardial effusion.   Mitral Valve: The mitral valve is abnormal. There is moderate thickening  of the mitral valve leaflet(s).  Moderate mitral valve regurgitation. No  evidence of mitral valve stenosis.   Tricuspid Valve:   The tricuspid valve is normal in structure. Tricuspid  valve regurgitation is moderate . No evidence of tricuspid stenosis.   Aortic Valve: The aortic valve is tricuspid. There is moderate  calcification of the aortic valve. There is moderate thickening of the  aortic valve. Aortic valve regurgitation is not visualized. Aortic valve  sclerosis/calcification is present, without any   evidence of aortic stenosis.   Pulmonic Valve: The pulmonic valve was normal in structure. Pulmonic valve  regurgitation is mild. No evidence of pulmonic stenosis.   Aorta: Aortic dilatation noted. There is moderate dilatation of the  ascending aorta, measuring 42 mm.   Venous: The inferior vena cava is dilated in size with less than 50%  respiratory variability, suggesting right atrial pressure of 15 mmHg.   IAS/Shunts: No atrial level shunt detected by color flow Doppler.       Patient Profile     72 y.o. male with a hx of CAD s/p CABGx4 in 2020 (LIMA-LAD, SVG-OM, SVG-PDA and SVG-PL), DM, HTN, HLD who was seen 05/06/2022 for the evaluation of acute decompensated heart failure.  Assessment & Plan    HFrEF/suspected tachy-mediated Moderately reduced RV -- Echocardiogram 1/15 showed LVEF of 20 to 25%, global hypokinesis, mildly reduced and enlarged RV, moderately dilated left atrium, moderate MR, moderate TR -- GDMT: Significantly limited by his low blood pressures, initially required Levophed on admission which has now been titrated off.  Currently on midodrine 10 mg 3 times daily -- IV Lasix 80 mg twice daily, -2.3 L yesterday, net -8 L  Atrial flutter with RVR -- Rates mostly in the 100 range -- Plan for TEE/DCCV 1/19 -- Continue amiodarone 200 mg twice daily, Eliquis 5 mg twice daily  Acute hypoxic respiratory failure Flu/Pneumonia  -- Initially required BiPAP on admission has been weaned to [place],  room air -- On Tamiflu  Hypokalemia -- K+ 3.2, supplement  For questions or updates, please contact [place] Please consult [email or web address] for contact info under        Signed, Reino Bellis, NP  05/10/2022, 10:36 AM    I have examined the patient and reviewed assessment and plan and discussed with patient.  Agree with above as stated.    Still in atrial flutter.   Respiratory status is stable on room air.  Mildly decreased breath sounds at the right base.  Treatment for LVEF is limited due to borderline low blood pressure.  Likely still has some right pleural effusion.  Otherwise, appears euvolemic.  Creatinine went back to 1.4 today.  Will stop Lasix for now.  Can reconsider giving more Lasix after TEE cardioversion tomorrow.  Plan for cardioversion tomorrow.  Continue amiodarone and Eliquis.  Jerry Myers

## 2022-05-10 NOTE — Progress Notes (Signed)
   Heart Failure Stewardship Pharmacist Progress Note   PCP: Patient, No Pcp Per PCP-Cardiologist: None    HPI:  72 yo M with PMH of CAD s/p CABG in 2020, HTN, T2DM, and HLD.   He presented to the ED on 1/13 for CHF exacerbation, weakness, and shortness of breath. He was at [facility] for 3-4 days but was wanting to leave so he was discharged. Was in respiratory distress and hypotensive, CCM consulted and was admitted to the ICU. CXR with R pleural effusion and bibasilar atelectasis. Lactate 2.9 Antibiotics started for PNA. Lactate improved to 1.0. ECHO 1/15 showed LVEF 20-25%, global hypokinesis, mild LVH, RV moderately reduced, moderate MR and TR. Remains in afib RVR. Plan TEE/DCCV on Friday pending clinical status.   Current HF Medications: Diuretic: furosemide 80 mg IV x1 today *also on midodrine  Prior to admission HF Medications: Diuretic: furosemide 40 mg daily Beta blocker: metoprolol XL 50 mg daily ACE/ARB/ARNI: valsartan 80 mg daily MRA: spironolactone 25 mg daily SGLT2i: Farxiga 10 mg daily *carvedilol and HCTZ still on PTA med list (dc'd from [place] at discharge)  Pertinent Lab Values: Serum creatinine 1.42, BUN 26, Potassium 3.2, Sodium 137, BNP 1029.1, Magnesium 2.1, A1c 10.6   Vital Signs: Weight: 194 lbs (admission weight: 225 lbs) Blood pressure: 100/70s  Heart rate: 100s  I/O: -2.5L yesterday; net -7.2L  Medication Assistance / Insurance Benefits Check: Does the patient have prescription insurance?  Yes Type of insurance plan: [place] Medicaid  Outpatient Pharmacy:  Prior to admission outpatient pharmacy: [facility] Is the patient willing to use [place] pharmacy at discharge? Yes Is the patient willing to transition their outpatient pharmacy to utilize a [facility] outpatient pharmacy?   Pending    Assessment: 1. Acute on chronic systolic CHF (LVEF 33-35%), due to ICM. NYHA class III symptoms. - Received furosemide 80 mg IV x1 today. Strict I/Os  and daily weights. Keep K>4 and Mg>2. KCl 40 mEq x 2 ordered for replacement. - GDMT limited by hypotension and AKI. Now off pressors. Trend BP and creatinine. - Continue midodrine 10 mg TID   Plan: 1) Medication changes recommended at this time: - Agree with changes  2) Patient assistance: Delene Loll copay $0 - [place]ard[place] copay $0  3)  Education  - To be completed prior to discharge  Kerby Nora, PharmD, BCPS Heart Failure Stewardship Pharmacist Phone [phone]

## 2022-05-10 NOTE — H&P (View-Only) (Signed)
Rounding Note    Patient Name: Jerry Myers Date of Encounter: 05/10/2022  [place] Cardiologist: None   Subjective   No complaints this morning.   Inpatient Medications    Scheduled Meds:  amiodarone  200 mg Oral BID   apixaban  5 mg Oral BID   Chlorhexidine Gluconate Cloth  6 each Topical Daily   furosemide  80 mg Intravenous BID   insulin aspart  0-15 Units Subcutaneous Q4H   insulin glargine-yfgn  10 Units Subcutaneous BID   midodrine  10 mg Oral TID WC   oseltamivir  30 mg Oral BID   potassium chloride  40 mEq Oral BID   Continuous Infusions:  sodium chloride     PRN Meds: acetaminophen, mouth rinse   Vital Signs    Vitals:   05/09/22 1807 05/09/22 1947 05/09/22 2357 05/10/22 0949  BP: 119/87 100/78 (!) 148/114 103/78  Pulse: 91 (!) 106  (!) 110  Resp: (!) 34 (!) 30  19  Temp: 97.7 F (36.5 C) 97.8 F (36.6 C) 98.1 F (36.7 C) 97.7 F (36.5 C)  TempSrc: Oral Oral Oral Oral  SpO2: 97% 97%  94%  Weight:      Height:        Intake/Output Summary (Last 24 hours) at 05/10/2022 1036 Last data filed at 05/10/2022 0949 Gross per 24 hour  Intake --  Output 2825 ml  Net -2825 ml      05/06/2022   12:15 AM 05/05/2022    6:20 PM 02/18/2020    3:24 PM  Last 3 Weights  Weight (lbs) 194 lb 10.7 oz 225 lb 212 lb 9.6 oz  Weight (kg) 88.3 kg 102.059 kg 96.435 kg      Telemetry    Atrial flutter, rates 100s - Personally Reviewed  ECG    No new tracing  Physical Exam   GEN: No acute distress. Sitting up in bed. Neck: No JVD Cardiac: Tachy, no murmurs, rubs, or gallops.  Respiratory: Clear to auscultation bilaterally. GI: Soft, nontender, non-distended  MS: No edema; No deformity. Neuro:  Nonfocal  Psych: Normal affect   Labs    High Sensitivity Troponin:   Recent Labs  Lab 05/05/22 1635 05/05/22 2110  TROPONINIHS 93* 83*     Chemistry Recent Labs  Lab 05/05/22 1635 05/05/22 2119 05/06/22 0235 05/06/22 1316  05/07/22 0731 05/08/22 0430 05/09/22 0703 05/10/22 0100  NA 140   < > 142 137 136 135 134* 137  K 3.6   < > 3.1* 3.9 3.1* 3.5 3.7 3.2*  CL 102  --  104 102 102 99 99 99  CO2 24  --  25 21* 26 23 23 26   GLUCOSE 189*  --  156* 203* 93 102* 108* 105*  BUN 27*  --  29* 32* 36* 31* 29* 26*  CREATININE 1.70*  --  1.72* 1.73* 1.59* 1.35* 1.35* 1.42*  CALCIUM 8.9  --  8.4* 8.3* 8.0* 8.2* 8.3* 8.5*  MG  --   --  1.7 2.1 2.1  --   --   --   PROT 6.4*  --  5.6* 6.5  --   --   --   --   ALBUMIN 3.5  --  3.2* 3.4*  --   --   --   --   AST 25  --  18 30  --   --   --   --   ALT 19  --  19 25  --   --   --   --  ALKPHOS 70  --  62 68  --   --   --   --   BILITOT 1.0  --  0.7 0.6  --   --   --   --   GFRNONAA 42*  --  41* 41* 46* 55* 55* 52*  ANIONGAP 14  --  13 14 8 13 12 12    < > = values in this interval not displayed.    Lipids No results for input(s): "CHOL", "TRIG", "HDL", "LABVLDL", "LDLCALC", "CHOLHDL" in the last 168 hours.  Hematology Recent Labs  Lab 05/06/22 0235 05/06/22 1316 05/07/22 0731  WBC 5.7 7.0 4.7  RBC 3.98* 4.53 4.10*  HGB 12.1* 13.4 12.5*  HCT 35.5* 41.5 36.7*  MCV 89.2 91.6 89.5  MCH 30.4 29.6 30.5  MCHC 34.1 32.3 34.1  RDW 16.0* 16.0* 15.6*  PLT 220 233 225   Thyroid No results for input(s): "TSH", "FREET4" in the last 168 hours.  BNP Recent Labs  Lab 05/05/22 1635 05/06/22 1316  BNP 1,029.1* 955.5*    DDimer No results for input(s): "DDIMER" in the last 168 hours.   Radiology    No results found.  Cardiac Studies   Echo: 05/07/2022  IMPRESSIONS     1. Left ventricular ejection fraction, by estimation, is 20 to 25%. The  left ventricle has severely decreased function. The left ventricle  demonstrates global hypokinesis. The left ventricular internal cavity size  was moderately dilated. There is mild  left ventricular hypertrophy. Left ventricular diastolic parameters are  indeterminate.   2. Right ventricular systolic function is  moderately reduced. The right  ventricular size is moderately enlarged. There is mildly elevated  pulmonary artery systolic pressure.   3. Left atrial size was moderately dilated.   4. The mitral valve is abnormal. Moderate mitral valve regurgitation. No  evidence of mitral stenosis.   5. Tricuspid valve regurgitation is moderate.   6. The aortic valve is tricuspid. There is moderate calcification of the  aortic valve. There is moderate thickening of the aortic valve. Aortic  valve regurgitation is not visualized. Aortic valve  sclerosis/calcification is present, without any evidence  of aortic stenosis.   7. Aortic dilatation noted. There is moderate dilatation of the ascending  aorta, measuring 42 mm.   8. The inferior vena cava is dilated in size with <50% respiratory  variability, suggesting right atrial pressure of 15 mmHg.   FINDINGS   Left Ventricle: Left ventricular ejection fraction, by estimation, is 20  to 25%. The left ventricle has severely decreased function. The left  ventricle demonstrates global hypokinesis. The left ventricular internal  cavity size was moderately dilated.  There is mild left ventricular hypertrophy. Left ventricular diastolic  parameters are indeterminate.   Right Ventricle: The right ventricular size is moderately enlarged. Right  vetricular wall thickness was not assessed. Right ventricular systolic  function is moderately reduced. There is mildly elevated pulmonary artery  systolic pressure. The tricuspid  regurgitant velocity is 2.41 m/s, and with an assumed right atrial  pressure of 15 mmHg, the estimated right ventricular systolic pressure is  38.2 mmHg.   Left Atrium: Left atrial size was moderately dilated.   Right Atrium: Right atrial size was normal in size.   Pericardium: There is no evidence of pericardial effusion.   Mitral Valve: The mitral valve is abnormal. There is moderate thickening  of the mitral valve leaflet(s).  Moderate mitral valve regurgitation. No  evidence of mitral valve stenosis.   Tricuspid Valve:  The tricuspid valve is normal in structure. Tricuspid  valve regurgitation is moderate . No evidence of tricuspid stenosis.   Aortic Valve: The aortic valve is tricuspid. There is moderate  calcification of the aortic valve. There is moderate thickening of the  aortic valve. Aortic valve regurgitation is not visualized. Aortic valve  sclerosis/calcification is present, without any   evidence of aortic stenosis.   Pulmonic Valve: The pulmonic valve was normal in structure. Pulmonic valve  regurgitation is mild. No evidence of pulmonic stenosis.   Aorta: Aortic dilatation noted. There is moderate dilatation of the  ascending aorta, measuring 42 mm.   Venous: The inferior vena cava is dilated in size with less than 50%  respiratory variability, suggesting right atrial pressure of 15 mmHg.   IAS/Shunts: No atrial level shunt detected by color flow Doppler.       Patient Profile     72 y.o. male with a hx of CAD s/p CABGx4 in 2020 (LIMA-LAD, SVG-OM, SVG-PDA and SVG-PL), DM, HTN, HLD who was seen 05/06/2022 for the evaluation of acute decompensated heart failure.  Assessment & Plan    HFrEF/suspected tachy-mediated Moderately reduced RV -- Echocardiogram 1/15 showed LVEF of 20 to 25%, global hypokinesis, mildly reduced and enlarged RV, moderately dilated left atrium, moderate MR, moderate TR -- GDMT: Significantly limited by his low blood pressures, initially required Levophed on admission which has now been titrated off.  Currently on midodrine 10 mg 3 times daily -- IV Lasix 80 mg twice daily, -2.3 L yesterday, net -8 L  Atrial flutter with RVR -- Rates mostly in the 100 range -- Plan for TEE/DCCV 1/19 -- Continue amiodarone 200 mg twice daily, Eliquis 5 mg twice daily  Acute hypoxic respiratory failure Flu/Pneumonia  -- Initially required BiPAP on admission has been weaned to [place],  room air -- On Tamiflu  Hypokalemia -- K+ 3.2, supplement  For questions or updates, please contact [place] Please consult [email or web address] for contact info under        Signed, Reino Bellis, NP  05/10/2022, 10:36 AM    I have examined the patient and reviewed assessment and plan and discussed with patient.  Agree with above as stated.    Still in atrial flutter.   Respiratory status is stable on room air.  Mildly decreased breath sounds at the right base.  Treatment for LVEF is limited due to borderline low blood pressure.  Likely still has some right pleural effusion.  Otherwise, appears euvolemic.  Creatinine went back to 1.4 today.  Will stop Lasix for now.  Can reconsider giving more Lasix after TEE cardioversion tomorrow.  Plan for cardioversion tomorrow.  Continue amiodarone and Eliquis.  Larae Grooms

## 2022-05-10 NOTE — Inpatient Diabetes Management (Signed)
Inpatient Diabetes Program Recommendations  AACE/ADA: New Consensus Statement on Inpatient Glycemic Control (2015)  Target Ranges:  Prepandial:   less than 140 mg/dL      Peak postprandial:   less than 180 mg/dL (1-2 hours)      Critically ill patients:  140 - 180 mg/dL   Lab Results  Component Value Date   GLUCAP 209 (H) 05/10/2022   HGBA1C 10.6 (H) 05/06/2022    Review of Glycemic Control  Latest Reference Range & Units 05/09/22 07:54 05/09/22 11:44 05/09/22 15:32 05/09/22 20:44 05/09/22 23:54 05/10/22 04:06 05/10/22 11:45  Glucose-Capillary 70 - 99 mg/dL 150 (H) 229 (H) 221 (H) 229 (H) 145 (H) 105 (H) 209 (H)   Diabetes history: DM 2 Outpatient Diabetes medications: Metformin 1000 mg bid, Januvia 100 mg Daily, Lantus 20 units qhs, Farxiga 10 mg Daily Current orders for Inpatient glycemic control:  Semglee 10 units bid Novolog 0-15 units Q4 hours  A1c 10.6% on 1/14  Spoke with pt at bedside regarding A1c and glucose control at home. Pt reports living by himself and does not remember to take his medications all the time. Pt reports he has a pill case and can place his medication somewhere where he will see it more easily to remind himself to take it everyday. Pt checks his glucose in the mornings and usually sees fasting glucose trends in the 150 range. Discussed with pt how his glucose is increasing throughout the day and after meal intake. Discussed importance of glucose control. Pt has follow up and is able to get all needed supplies at home for self management.  Thanks,  Tama Headings RN, MSN, BC-ADM Inpatient Diabetes Coordinator Team Pager [phone] (8a-5p)

## 2022-05-10 NOTE — Progress Notes (Signed)
PROGRESS NOTE    Jerry Myers  PJS:[id number] DOB: June 30, 1950 DOA: 05/05/2022 PCP: Patient, No Pcp Per  71/M with history of CAD/CABG 2020, chronic systolic CHF, type 2 diabetes mellitus, hypertension was recently hospitalized at [facility] with CHF and atrial flutter briefly diuresed and discharged home 1/13 per pt request.  Upon arrival home family noticed he was significantly dyspneic with minimal activity, EMS was called and he was brought to [facility], in the ER he was tachypneic, placed on BiPAP, febrile to 102, flu positive, blood pressure was low, he was started on pressors and admitted to ICU, troponin was 93 -Treated with Levophed and diuretics for combination of cardiogenic and septic shock -Weaned off pressors 1/17, started on midodrine -1/18 transferred to [facility] service today   Subjective: -Poor historian, reports feeling fair, some dyspnea with activity  Assessment and Plan:  Acute hypoxic respiratory failure -Secondary to CHF, flu pneumonia, improving -Wean O2 as tolerated, required BiPAP on admission -PT OT eval  Shock-multifactorial, combination of septic and cardiogenic -Treated with Levophed in ICU, -Influenza felt to be the source, blood cultures negative -Now on midodrine 10 Mg TID, continue Tamiflu  Acute on chronic systolic CHF, BiV failure CAD/CABG -Echo with EF of 20-25%, moderately reduced RV, previously EF was 30-35% in 5/20 -Now off Levophed, blood pressure in the 90s on midodrine 10 Mg 3 times daily -Continue IV Lasix, GDMT limited by hypotension -Cardiology following, worsening cardiomyopathy felt to be mediated by atrial flutter, previously known ischemic cardiomyopathy  Paroxysmal atrial fibs/atrial flutter -Now on oral amiodarone and apixaban -Heart rate suboptimal -Plan for TEE/cardioversion likely Friday  AKI/CKD -Baseline creatinine unknown, was 1.4 in 2021, 1.7 on admission, now down to 1.42, likely his baseline  Influenza -Continue  Tamiflu, complete course after today's dose  Type 2 diabetes mellitus -CBGs are improving continue Semglee and sliding scale insulin   DVT prophylaxis: Apixaban Code Status: Full code Family Communication: None present Disposition Plan: To be determined  Consultants: Cards, PCCM transfer   Procedures:   Antimicrobials:    Objective: Vitals:   05/09/22 1807 05/09/22 1947 05/09/22 2357 05/10/22 0949  BP: 119/87 100/78 (!) 148/114 103/78  Pulse: 91 (!) 106  (!) 110  Resp: (!) 34 (!) 30  19  Temp: 97.7 F (36.5 C) 97.8 F (36.6 C) 98.1 F (36.7 C) 97.7 F (36.5 C)  TempSrc: Oral Oral Oral Oral  SpO2: 97% 97%  94%  Weight:      Height:        Intake/Output Summary (Last 24 hours) at 05/10/2022 0956 Last data filed at 05/10/2022 0949 Gross per 24 hour  Intake 20.72 ml  Output 2700 ml  Net -2679.28 ml   Filed Weights   05/05/22 1820 05/06/22 0015  Weight: 102.1 kg 88.3 kg    Examination:  General exam: Chronically ill male laying in bed, AAO x 3, flat affect HEENT: Positive JVD CVS: S1-S2, irregular rhythm Lungs: Decreased breath sounds to bases Abdomen: Soft, nontender, bowel sounds present Extremities: Trace edema  Skin: No rashes Psychiatry: Flat affect    Data Reviewed:   CBC: Recent Labs  Lab 05/05/22 1635 05/05/22 2119 05/06/22 0235 05/06/22 1316 05/07/22 0731  WBC 5.6  --  5.7 7.0 4.7  NEUTROABS 3.9  --  4.2  --   --   HGB 13.2 13.6 12.1* 13.4 12.5*  HCT 40.9 40.0 35.5* 41.5 36.7*  MCV 91.3  --  89.2 91.6 89.5  PLT 271  --  220 233  867   Basic Metabolic Panel: Recent Labs  Lab 05/06/22 0235 05/06/22 1316 05/07/22 0731 05/08/22 0430 05/09/22 0703 05/10/22 0100  NA 142 137 136 135 134* 137  K 3.1* 3.9 3.1* 3.5 3.7 3.2*  CL 104 102 102 99 99 99  CO2 25 21* 26 23 23 26   GLUCOSE 156* 203* 93 102* 108* 105*  BUN 29* 32* 36* 31* 29* 26*  CREATININE 1.72* 1.73* 1.59* 1.35* 1.35* 1.42*  CALCIUM 8.4* 8.3* 8.0* 8.2* 8.3* 8.5*  MG 1.7  2.1 2.1  --   --   --   PHOS 4.9*  --  3.8  --   --   --    GFR: Estimated Creatinine Clearance: 49.3 mL/min (A) (by C-G formula based on SCr of 1.42 mg/dL (H)). Liver Function Tests: Recent Labs  Lab 05/05/22 1635 05/06/22 0235 05/06/22 1316  AST 25 18 30   ALT 19 19 25   ALKPHOS 70 62 68  BILITOT 1.0 0.7 0.6  PROT 6.4* 5.6* 6.5  ALBUMIN 3.5 3.2* 3.4*   No results for input(s): "LIPASE", "AMYLASE" in the last 168 hours. No results for input(s): "AMMONIA" in the last 168 hours. Coagulation Profile: No results for input(s): "INR", "PROTIME" in the last 168 hours. Cardiac Enzymes: No results for input(s): "CKTOTAL", "CKMB", "CKMBINDEX", "TROPONINI" in the last 168 hours. BNP (last 3 results) No results for input(s): "PROBNP" in the last 8760 hours. HbA1C: No results for input(s): "HGBA1C" in the last 72 hours. CBG: Recent Labs  Lab 05/09/22 1144 05/09/22 1532 05/09/22 2044 05/09/22 2354 05/10/22 0406  GLUCAP 229* 221* 229* 145* 105*   Lipid Profile: No results for input(s): "CHOL", "HDL", "LDLCALC", "TRIG", "CHOLHDL", "LDLDIRECT" in the last 72 hours. Thyroid Function Tests: No results for input(s): "TSH", "T4TOTAL", "FREET4", "T3FREE", "THYROIDAB" in the last 72 hours. Anemia Panel: No results for input(s): "VITAMINB12", "FOLATE", "FERRITIN", "TIBC", "IRON", "RETICCTPCT" in the last 72 hours. Urine analysis:    Component Value Date/Time   COLORURINE YELLOW 05/05/2022 1642   APPEARANCEUR CLEAR 05/05/2022 1642   LABSPEC 1.012 05/05/2022 1642   PHURINE 5.0 05/05/2022 1642   GLUCOSEU >=500 (A) 05/05/2022 1642   HGBUR MODERATE (A) 05/05/2022 1642   BILIRUBINUR NEGATIVE 05/05/2022 1642   KETONESUR NEGATIVE 05/05/2022 1642   PROTEINUR NEGATIVE 05/05/2022 1642   NITRITE NEGATIVE 05/05/2022 1642   LEUKOCYTESUR NEGATIVE 05/05/2022 1642   Sepsis Labs: @LABRCNTIP (procalcitonin:4,lacticidven:4)  ) Recent Results (from the past 240 hour(s))  Blood culture (routine x 2)      Status: None (Preliminary result)   Collection Time: 05/05/22  9:05 PM   Specimen: BLOOD  Result Value Ref Range Status   Specimen Description BLOOD RIGHT ANTECUBITAL  Final   Special Requests   Final    BOTTLES DRAWN AEROBIC AND ANAEROBIC Blood Culture adequate volume   Culture   Final    NO GROWTH 4 DAYS Performed at [facility], [place] [address], [place], [place] [address]    Report Status PENDING  Incomplete  Blood culture (routine x 2)     Status: None (Preliminary result)   Collection Time: 05/05/22  9:10 PM   Specimen: BLOOD LEFT WRIST  Result Value Ref Range Status   Specimen Description BLOOD LEFT WRIST  Final   Special Requests   Final    BOTTLES DRAWN AEROBIC AND ANAEROBIC Blood Culture adequate volume   Culture   Final    NO GROWTH 4 DAYS Performed at [facility], [place] [address],  [place], [place] [address]    Report Status PENDING  Incomplete  MRSA Next Gen by PCR, Nasal     Status: None   Collection Time: 05/05/22 11:30 PM   Specimen: Anterior Nasal Swab  Result Value Ref Range Status   MRSA by PCR Next Gen NOT DETECTED NOT DETECTED Final    Comment: (NOTE) The GeneXpert MRSA Assay (FDA approved for NASAL specimens only), is one component of a comprehensive MRSA colonization surveillance program. It is not intended to diagnose MRSA infection nor to guide or monitor treatment for MRSA infections. Test performance is not FDA approved in patients less than 64 years old. Performed at [facility], [address], [place], [place] [address]   Resp panel by RT-PCR (RSV, Flu A&B, Covid) Anterior Nasal Swab     Status: Abnormal   Collection Time: 05/05/22 11:32 PM   Specimen: Anterior Nasal Swab  Result Value Ref Range Status   SARS Coronavirus 2 by RT PCR NEGATIVE NEGATIVE Final    Comment: (NOTE) SARS-CoV-2 target nucleic acids are NOT DETECTED.  The SARS-CoV-2 RNA is generally detectable in upper respiratory specimens during the acute phase  of infection. The lowest concentration of SARS-CoV-2 viral copies this assay can detect is 138 copies/mL. A negative result does not preclude SARS-Cov-2 infection and should not be used as the sole basis for treatment or other patient management decisions. A negative result may occur with  improper specimen collection/handling, submission of specimen other than nasopharyngeal swab, presence of viral mutation(s) within the areas targeted by this assay, and inadequate number of viral copies(<138 copies/mL). A negative result must be combined with clinical observations, patient history, and epidemiological information. The expected result is Negative.  Fact Sheet for Patients:  BloggerCourse.com  Fact Sheet for Healthcare Providers:  SeriousBroker.it  This test is no t yet approved or cleared by the [place] FDA and  has been authorized for detection and/or diagnosis of SARS-CoV-2 by FDA under an Emergency Use Authorization (EUA). This EUA will remain  in effect (meaning this test can be used) for the duration of the COVID-19 declaration under Section 564(b)(1) of the Act, 21 U.S.C.section 360bbb-3(b)(1), unless the authorization is terminated  or revoked sooner.       Influenza A by PCR POSITIVE (A) NEGATIVE Final   Influenza B by PCR NEGATIVE NEGATIVE Final    Comment: (NOTE) The Xpert Xpress SARS-CoV-2/FLU/RSV plus assay is intended as an aid in the diagnosis of influenza from Nasopharyngeal swab specimens and should not be used as a sole basis for treatment. Nasal washings and aspirates are unacceptable for Xpert Xpress SARS-CoV-2/FLU/RSV testing.  Fact Sheet for Patients: BloggerCourse.com  Fact Sheet for Healthcare Providers: SeriousBroker.it  This test is not yet approved or cleared by the [place] FDA and has been authorized for detection and/or diagnosis of  SARS-CoV-2 by FDA under an Emergency Use Authorization (EUA). This EUA will remain in effect (meaning this test can be used) for the duration of the COVID-19 declaration under Section 564(b)(1) of the Act, 21 U.S.C. section 360bbb-3(b)(1), unless the authorization is terminated or revoked.     Resp Syncytial Virus by PCR NEGATIVE NEGATIVE Final    Comment: (NOTE) Fact Sheet for Patients: BloggerCourse.com  Fact Sheet for Healthcare Providers: SeriousBroker.it  This test is not yet approved or cleared by the [place] FDA and has been authorized for detection and/or diagnosis of SARS-CoV-2 by FDA under an Emergency Use Authorization (EUA). This EUA will remain in effect (  meaning this test can be used) for the duration of the COVID-19 declaration under Section 564(b)(1) of the Act, 21 U.S.C. section 360bbb-3(b)(1), unless the authorization is terminated or revoked.  Performed at [facility], [place] [address] [place], [place] [address]      Radiology Studies: No results found.   Scheduled Meds:  amiodarone  200 mg Oral BID   apixaban  5 mg Oral BID   Chlorhexidine Gluconate Cloth  6 each Topical Daily   furosemide  80 mg Intravenous BID   insulin aspart  0-15 Units Subcutaneous Q4H   insulin glargine-yfgn  10 Units Subcutaneous BID   midodrine  10 mg Oral TID WC   oseltamivir  30 mg Oral BID   potassium chloride  40 mEq Oral BID   Continuous Infusions:  sodium chloride       LOS: 5 days    Time spent: 67min    Domenic Polite, MD [facility]   05/10/2022, 9:56 AM

## 2022-05-11 ENCOUNTER — Inpatient Hospital Stay (HOSPITAL_COMMUNITY): Payer: 59

## 2022-05-11 ENCOUNTER — Inpatient Hospital Stay (HOSPITAL_COMMUNITY): Payer: 59 | Admitting: Anesthesiology

## 2022-05-11 ENCOUNTER — Ambulatory Visit (HOSPITAL_COMMUNITY): Admit: 2022-05-11 | Payer: 59 | Admitting: Internal Medicine

## 2022-05-11 ENCOUNTER — Encounter (HOSPITAL_COMMUNITY): Payer: Self-pay | Admitting: Pulmonary Disease

## 2022-05-11 ENCOUNTER — Encounter (HOSPITAL_COMMUNITY)
Admission: EM | Disposition: A | Discharge status: Skilled Nursing Facility | Payer: Self-pay | Source: Home / Self Care | Attending: Internal Medicine

## 2022-05-11 DIAGNOSIS — I4892 Unspecified atrial flutter: Secondary | ICD-10-CM

## 2022-05-11 DIAGNOSIS — I5021 Acute systolic (congestive) heart failure: Secondary | ICD-10-CM | POA: Diagnosis not present

## 2022-05-11 DIAGNOSIS — I340 Nonrheumatic mitral (valve) insufficiency: Secondary | ICD-10-CM | POA: Diagnosis not present

## 2022-05-11 DIAGNOSIS — I5023 Acute on chronic systolic (congestive) heart failure: Secondary | ICD-10-CM | POA: Diagnosis not present

## 2022-05-11 DIAGNOSIS — I509 Heart failure, unspecified: Secondary | ICD-10-CM

## 2022-05-11 DIAGNOSIS — I110 Hypertensive heart disease with heart failure: Secondary | ICD-10-CM

## 2022-05-11 DIAGNOSIS — I25119 Atherosclerotic heart disease of native coronary artery with unspecified angina pectoris: Secondary | ICD-10-CM

## 2022-05-11 HISTORY — PX: TEE WITHOUT CARDIOVERSION: SHX5443

## 2022-05-11 HISTORY — PX: CARDIOVERSION: SHX1299

## 2022-05-11 LAB — PROTIME-INR
INR: 1.6 — ABNORMAL HIGH (ref 0.8–1.2)
Prothrombin Time: 19 seconds — ABNORMAL HIGH (ref 11.4–15.2)

## 2022-05-11 LAB — GLUCOSE, CAPILLARY
Glucose-Capillary: 171 mg/dL — ABNORMAL HIGH (ref 70–99)
Glucose-Capillary: 135 mg/dL — ABNORMAL HIGH (ref 70–99)
Glucose-Capillary: 146 mg/dL — ABNORMAL HIGH (ref 70–99)
Glucose-Capillary: 157 mg/dL — ABNORMAL HIGH (ref 70–99)
Glucose-Capillary: 193 mg/dL — ABNORMAL HIGH (ref 70–99)
Glucose-Capillary: 186 mg/dL — ABNORMAL HIGH (ref 70–99)

## 2022-05-11 LAB — CBC
HCT: 38.5 % — ABNORMAL LOW (ref 39.0–52.0)
Hemoglobin: 13.1 g/dL (ref 13.0–17.0)
MCH: 29.7 pg (ref 26.0–34.0)
MCHC: 34 g/dL (ref 30.0–36.0)
MCV: 87.3 fL (ref 80.0–100.0)
Platelets: 248 10*3/uL (ref 150–400)
RBC: 4.41 MIL/uL (ref 4.22–5.81)
RDW: 15.1 % (ref 11.5–15.5)
WBC: 5.6 10*3/uL (ref 4.0–10.5)
nRBC: 0 % (ref 0.0–0.2)

## 2022-05-11 LAB — COMPREHENSIVE METABOLIC PANEL
ALT: 67 U/L — ABNORMAL HIGH (ref 0–44)
AST: 60 U/L — ABNORMAL HIGH (ref 15–41)
Albumin: 2.9 g/dL — ABNORMAL LOW (ref 3.5–5.0)
Alkaline Phosphatase: 114 U/L (ref 38–126)
Anion gap: 10 (ref 5–15)
BUN: 28 mg/dL — ABNORMAL HIGH (ref 8–23)
CO2: 24 mmol/L (ref 22–32)
Calcium: 8.4 mg/dL — ABNORMAL LOW (ref 8.9–10.3)
Chloride: 103 mmol/L (ref 98–111)
Creatinine, Ser: 1.43 mg/dL — ABNORMAL HIGH (ref 0.61–1.24)
GFR, Estimated: 51 mL/min — ABNORMAL LOW (ref >60)
Glucose, Bld: 168 mg/dL — ABNORMAL HIGH (ref 70–99)
Potassium: 4.1 mmol/L (ref 3.5–5.1)
Sodium: 137 mmol/L (ref 135–145)
Total Bilirubin: 0.9 mg/dL (ref 0.3–1.2)
Total Protein: 6.1 g/dL — ABNORMAL LOW (ref 6.5–8.1)

## 2022-05-11 LAB — ECHO TEE: Est EF: 20

## 2022-05-11 SURGERY — ECHOCARDIOGRAM, TRANSESOPHAGEAL
Anesthesia: Monitor Anesthesia Care

## 2022-05-11 MED ORDER — SODIUM CHLORIDE 0.9 % IV SOLN: INTRAVENOUS | Status: DC

## 2022-05-11 MED ORDER — FUROSEMIDE 40 MG PO TABS
40.0000 mg | ORAL_TABLET | Freq: Every day | ORAL | Status: DC
  Administered 2022-05-11 – 2022-05-12 (×2): 40 mg via ORAL
  Filled 2022-05-11 (×2): qty 1

## 2022-05-11 MED ORDER — POTASSIUM CHLORIDE CRYS ER 20 MEQ PO TBCR
40.0000 meq | EXTENDED_RELEASE_TABLET | Freq: Once | ORAL | Status: AC
  Administered 2022-05-11: 40 meq via ORAL
  Filled 2022-05-11: qty 2

## 2022-05-11 MED ORDER — PROPOFOL 500 MG/50ML IV EMUL
INTRAVENOUS | Status: DC | PRN
  Administered 2022-05-11: 50 ug/kg/min via INTRAVENOUS

## 2022-05-11 MED ORDER — BUTAMBEN-TETRACAINE-BENZOCAINE 2-2-14 % EX AERO
INHALATION_SPRAY | CUTANEOUS | Status: DC | PRN
  Administered 2022-05-11: 1 via TOPICAL

## 2022-05-11 MED ORDER — PHENYLEPHRINE 80 MCG/ML (10ML) SYRINGE FOR IV PUSH (FOR BLOOD PRESSURE SUPPORT)
PREFILLED_SYRINGE | INTRAVENOUS | Status: DC | PRN
  Administered 2022-05-11: 80 ug via INTRAVENOUS
  Administered 2022-05-11: 160 ug via INTRAVENOUS

## 2022-05-11 MED ORDER — ETOMIDATE 2 MG/ML IV SOLN
INTRAVENOUS | Status: DC | PRN
  Administered 2022-05-11: 2 mg via INTRAVENOUS

## 2022-05-11 MED ORDER — EPHEDRINE SULFATE-NACL 50-0.9 MG/10ML-% IV SOSY
PREFILLED_SYRINGE | INTRAVENOUS | Status: DC | PRN
  Administered 2022-05-11 (×2): 5 mg via INTRAVENOUS
  Administered 2022-05-11: 10 mg via INTRAVENOUS

## 2022-05-11 MED ORDER — BUTAMBEN-TETRACAINE-BENZOCAINE 2-2-14 % EX AERO
INHALATION_SPRAY | CUTANEOUS | Status: AC
  Filled 2022-05-11: qty 20

## 2022-05-11 NOTE — Anesthesia Preprocedure Evaluation (Addendum)
Anesthesia Evaluation  Patient identified by MRN, date of birth, ID band Patient awake    Reviewed: Allergy & Precautions, NPO status , Patient's Chart, lab work & pertinent test results  Airway Mallampati: III  TM Distance: >3 FB Neck ROM: Full    Dental  (+) Edentulous Lower, Edentulous Upper   Pulmonary shortness of breath On oxygen   Pulmonary exam normal        Cardiovascular hypertension, Pt. on home beta blockers and Pt. on medications + angina  + CAD, + CABG and +CHF   Rhythm:Regular Rate:Tachycardia  ECHO: 1. Left ventricular ejection fraction, by estimation, is 20 to 25%. The  left ventricle has severely decreased function. The left ventricle  demonstrates global hypokinesis. The left ventricular internal cavity size  was moderately dilated. There is mild  left ventricular hypertrophy. Left ventricular diastolic parameters are  indeterminate.   2. Right ventricular systolic function is moderately reduced. The right  ventricular size is moderately enlarged. There is mildly elevated  pulmonary artery systolic pressure.   3. Left atrial size was moderately dilated.   4. The mitral valve is abnormal. Moderate mitral valve regurgitation. No  evidence of mitral stenosis.   5. Tricuspid valve regurgitation is moderate.   6. The aortic valve is tricuspid. There is moderate calcification of the  aortic valve. There is moderate thickening of the aortic valve. Aortic  valve regurgitation is not visualized. Aortic valve  sclerosis/calcification is present, without any evidence  of aortic stenosis.   7. Aortic dilatation noted. There is moderate dilatation of the ascending  aorta, measuring 42 mm.   8. The inferior vena cava is dilated in size with <50% respiratory  variability, suggesting right atrial pressure of 15 mmHg.     Neuro/Psych  PSYCHIATRIC DISORDERS Anxiety Depression    negative neurological ROS      GI/Hepatic negative GI ROS, Neg liver ROS,,,  Endo/Other  diabetes, Oral Hypoglycemic Agents, Insulin Dependent    Renal/GU Renal disease     Musculoskeletal  (+) Arthritis ,    Abdominal   Peds  Hematology  (+) Blood dyscrasia (Eliquis)   Anesthesia Other Findings A-FIB  Reproductive/Obstetrics                             Anesthesia Physical Anesthesia Plan  ASA: 4  Anesthesia Plan: MAC   Post-op Pain Management:    Induction: Intravenous  PONV Risk Score and Plan: 1 and Treatment may vary due to age or medical condition  Airway Management Planned: Nasal Cannula  Additional Equipment:   Intra-op Plan:   Post-operative Plan:   Informed Consent: I have reviewed the patients History and Physical, chart, labs and discussed the procedure including the risks, benefits and alternatives for the proposed anesthesia with the patient or authorized representative who has indicated his/her understanding and acceptance.     Dental advisory given  Plan Discussed with: CRNA  Anesthesia Plan Comments:        Anesthesia Quick Evaluation

## 2022-05-11 NOTE — Anesthesia Procedure Notes (Signed)
Procedure Name: MAC Date/Time: 05/11/2022 8:29 AM  Performed by: Leonor Liv, CRNAOxygen Delivery Method: Nasal cannula Preoxygenation: Pre-oxygenation with 100% oxygen Placement Confirmation: positive ETCO2 Dental Injury: Teeth and Oropharynx as per pre-operative assessment  Comments: Optiflow high flow nasal cannula

## 2022-05-11 NOTE — CV Procedure (Signed)
INDICATIONS: Atrial flutter  PROCEDURE:   Informed consent was obtained prior to the procedure. The risks, benefits and alternatives for the procedure were discussed and the patient comprehended these risks.  Risks include, but are not limited to, cough, sore throat, vomiting, nausea, somnolence, esophageal and stomach trauma or perforation, bleeding, low blood pressure, aspiration, pneumonia, infection, trauma to the teeth and death.    After a procedural time-out, the oropharynx was anesthetized with 20% benzocaine spray.   During this procedure the patient was administered propofol per anesthesia.  The patient's heart rate, blood pressure, and oxygen saturation were monitored continuously during the procedure. The period of conscious sedation was 10 minutes, of which I was present face-to-face 100% of this time.  The transesophageal probe was inserted in the esophagus and stomach without difficulty and multiple views were obtained.  The patient was kept under observation until the patient left the procedure room.  The patient left the procedure room in stable condition.   Agitated microbubble saline contrast was not administered.  COMPLICATIONS:    There were no immediate complications.  FINDINGS:   FORMAL ECHOCARDIOGRAM REPORT PENDING Limited echo due to patient hemodynamic instability No LA appendage thrombus.  LVEF 25%  RECOMMENDATIONS:    Proceed to cardioversion  Elouise Munroe 05/11/2022, 8:57 AM Procedure: Electrical Cardioversion Indications:  Atrial Flutter  Procedure Details:  Consent: Risks of procedure as well as the alternatives and risks of each were explained to the (patient/caregiver).  Consent for procedure obtained.  Time Out: Verified patient identification, verified procedure, site/side was marked, verified correct patient position, special equipment/implants available, medications/allergies/relevent history reviewed, required imaging and test results  available. PERFORMED.  Patient placed on cardiac monitor, pulse oximetry, supplemental oxygen as necessary.  Sedation given:  propofol per anesthesia Pacer pads placed anterior and posterior chest.  Cardioverted 1 time(s).  Cardioversion with synchronized biphasic 120J shock.  Evaluation: Findings: Post procedure EKG shows: NSR Complications: None Patient did tolerate procedure well. Patient required phenylephrine 240 mcg, ephedrine 20 mg in total for procedure to maintain hemodynamic stability.  Time Spent Directly with the Patient:  45 minutes   Elouise Munroe 05/11/2022, 8:57 AM

## 2022-05-11 NOTE — Progress Notes (Signed)
PROGRESS NOTE    DEMPSEY AHONEN  CWC:[id number] DOB: 25-Apr-1950 DOA: 05/05/2022 PCP: Patient, No Pcp Per  72/M with history of CAD/CABG 7616, chronic systolic CHF, type 2 diabetes mellitus, hypertension was recently hospitalized at [facility] with CHF and atrial flutter briefly diuresed and discharged home 1/13 per pt request.  Upon arrival home family noticed he was significantly dyspneic with minimal activity, EMS was called and he was brought to [facility], in the ER he was tachypneic, placed on BiPAP, febrile to 102, flu positive, blood pressure was low, he was started on pressors and admitted to ICU, troponin was 93 -Treated with Levophed and diuretics for combination of cardiogenic and septic shock -Weaned off pressors 1/17, started on midodrine -1/18 transferred to [facility]   Subjective: -Back from cardioversion, reports feeling better and breathing better  Assessment and Plan:  Acute hypoxic respiratory failure -Secondary to CHF, flu pneumonia, improving -Wean O2 as tolerated, required BiPAP on admission -PT OT eval completed, SNF recommended, patient declines this, agreeable for home health services  Shock-multifactorial, combination of septic and cardiogenic -Treated with Levophed in ICU, -Influenza felt to be the source, blood cultures negative -Now on midodrine 10 Mg TID, completed Tamiflu  Acute on chronic systolic CHF, BiV failure CAD/CABG -Echo with EF of 20-25%, moderately reduced RV, previously EF was 30-35% in 5/20 -Now off Levophed, blood pressure in the 90-110s on midodrine 10 Mg 3 times daily -Diuresed with IV Lasix, he is 7.2 L negative -Changed to oral Lasix today, GDMT limited by hypotension -Cardiology following, worsening cardiomyopathy felt to be mediated by atrial flutter, previously known ischemic cardiomyopathy  Paroxysmal atrial fibs/atrial flutter -Now on oral amiodarone and apixaban -Just underwent cardioversion this  morning  AKI/CKD -Baseline creatinine unknown, was 1.4 in 2021, 1.7 on admission, now down to 1.42, likely his baseline  Influenza -Continue Tamiflu, completed course  Type 2 diabetes mellitus -CBGs are improving continue Semglee and sliding scale insulin   DVT prophylaxis: Apixaban Code Status: Full code Family Communication: None present Disposition Plan: Home with home health services as in 1 to 2 days  Consultants: Cards, PCCM transfer   Procedures: TEE/cardioversion 1/19  Antimicrobials:    Objective: Vitals:   05/11/22 0935 05/11/22 0940 05/11/22 0945 05/11/22 0950  BP:  106/65    Pulse: 82 83 82 83  Resp: (!) 27 18 (!) 33 18  Temp:      TempSrc:      SpO2: 95% 95% 94% 96%  Weight:      Height:        Intake/Output Summary (Last 24 hours) at 05/11/2022 1331 Last data filed at 05/11/2022 1300 Gross per 24 hour  Intake 590 ml  Output 600 ml  Net -10 ml   Filed Weights   05/06/22 0015 05/11/22 0559 05/11/22 0708  Weight: 88.3 kg 84.6 kg 84.6 kg    Examination:  General exam: Chronically ill male appears much older than stated age, AAOx3, flat affect HEENT: No JVD CVS: S1-S2, regular rhythm now Lungs: Decreased breath sounds to bases Abdomen: Soft, nontender, bowel sounds present Extremities: Trace edema Skin: No rashes Psychiatry: Flat affect    Data Reviewed:   CBC: Recent Labs  Lab 05/05/22 1635 05/05/22 2119 05/06/22 0235 05/06/22 1316 05/07/22 0731 05/11/22 0114  WBC 5.6  --  5.7 7.0 4.7 5.6  NEUTROABS 3.9  --  4.2  --   --   --   HGB 13.2 13.6 12.1* 13.4 12.5* 13.1  HCT 40.9  40.0 35.5* 41.5 36.7* 38.5*  MCV 91.3  --  89.2 91.6 89.5 87.3  PLT 271  --  220 233 225 248   Basic Metabolic Panel: Recent Labs  Lab 05/06/22 0235 05/06/22 1316 05/07/22 0731 05/08/22 0430 05/09/22 0703 05/10/22 0100 05/11/22 0114  NA 142 137 136 135 134* 137 137  K 3.1* 3.9 3.1* 3.5 3.7 3.2* 4.1  CL 104 102 102 99 99 99 103  CO2 25 21* 26 23 23  26 24   GLUCOSE 156* 203* 93 102* 108* 105* 168*  BUN 29* 32* 36* 31* 29* 26* 28*  CREATININE 1.72* 1.73* 1.59* 1.35* 1.35* 1.42* 1.43*  CALCIUM 8.4* 8.3* 8.0* 8.2* 8.3* 8.5* 8.4*  MG 1.7 2.1 2.1  --   --   --   --   PHOS 4.9*  --  3.8  --   --   --   --    GFR: Estimated Creatinine Clearance: 48 mL/min (A) (by C-G formula based on SCr of 1.43 mg/dL (H)). Liver Function Tests: Recent Labs  Lab 05/05/22 1635 05/06/22 0235 05/06/22 1316 05/11/22 0114  AST 25 18 30  60*  ALT 19 19 25  67*  ALKPHOS 70 62 68 114  BILITOT 1.0 0.7 0.6 0.9  PROT 6.4* 5.6* 6.5 6.1*  ALBUMIN 3.5 3.2* 3.4* 2.9*   No results for input(s): "LIPASE", "AMYLASE" in the last 168 hours. No results for input(s): "AMMONIA" in the last 168 hours. Coagulation Profile: Recent Labs  Lab 05/11/22 1025  INR 1.6*   Cardiac Enzymes: No results for input(s): "CKTOTAL", "CKMB", "CKMBINDEX", "TROPONINI" in the last 168 hours. BNP (last 3 results) No results for input(s): "PROBNP" in the last 8760 hours. HbA1C: No results for input(s): "HGBA1C" in the last 72 hours. CBG: Recent Labs  Lab 05/10/22 2125 05/11/22 0609 05/11/22 0730 05/11/22 1039 05/11/22 1100  GLUCAP 215* 171* 135* 157* 146*   Lipid Profile: No results for input(s): "CHOL", "HDL", "LDLCALC", "TRIG", "CHOLHDL", "LDLDIRECT" in the last 72 hours. Thyroid Function Tests: No results for input(s): "TSH", "T4TOTAL", "FREET4", "T3FREE", "THYROIDAB" in the last 72 hours. Anemia Panel: No results for input(s): "VITAMINB12", "FOLATE", "FERRITIN", "TIBC", "IRON", "RETICCTPCT" in the last 72 hours. Urine analysis:    Component Value Date/Time   COLORURINE YELLOW 05/05/2022 1642   APPEARANCEUR CLEAR 05/05/2022 1642   LABSPEC 1.012 05/05/2022 1642   PHURINE 5.0 05/05/2022 1642   GLUCOSEU >=500 (A) 05/05/2022 1642   HGBUR MODERATE (A) 05/05/2022 1642   BILIRUBINUR NEGATIVE 05/05/2022 1642   KETONESUR NEGATIVE 05/05/2022 1642   PROTEINUR NEGATIVE  05/05/2022 1642   NITRITE NEGATIVE 05/05/2022 1642   LEUKOCYTESUR NEGATIVE 05/05/2022 1642   Sepsis Labs: @LABRCNTIP (procalcitonin:4,lacticidven:4)  ) Recent Results (from the past 240 hour(s))  Blood culture (routine x 2)     Status: None   Collection Time: 05/05/22  9:05 PM   Specimen: BLOOD  Result Value Ref Range Status   Specimen Description BLOOD RIGHT ANTECUBITAL  Final   Special Requests   Final    BOTTLES DRAWN AEROBIC AND ANAEROBIC Blood Culture adequate volume   Culture   Final    NO GROWTH 5 DAYS Performed at [facility], [address] [place], 05/07/22 [facility]    Report Status 05/10/2022 FINAL  Final  Blood culture (routine x 2)     Status: None   Collection Time: 05/05/22  9:10 PM   Specimen: BLOOD LEFT WRIST  Result Value Ref Range Status   Specimen Description BLOOD LEFT  WRIST  Final   Special Requests   Final    BOTTLES DRAWN AEROBIC AND ANAEROBIC Blood Culture adequate volume   Culture   Final    NO GROWTH 5 DAYS Performed at [facility], [address], [place], [place] [address]    Report Status 05/10/2022 FINAL  Final  MRSA Next Gen by PCR, Nasal     Status: None   Collection Time: 05/05/22 11:30 PM   Specimen: Anterior Nasal Swab  Result Value Ref Range Status   MRSA by PCR Next Gen NOT DETECTED NOT DETECTED Final    Comment: (NOTE) The GeneXpert MRSA Assay (FDA approved for NASAL specimens only), is one component of a comprehensive MRSA colonization surveillance program. It is not intended to diagnose MRSA infection nor to guide or monitor treatment for MRSA infections. Test performance is not FDA approved in patients less than 44 years old. Performed at [facility], [address] [place], [place] [address]   Resp panel by RT-PCR (RSV, Flu A&B, Covid) Anterior Nasal Swab     Status: Abnormal   Collection Time: 05/05/22 11:32 PM   Specimen: Anterior Nasal Swab  Result Value Ref Range Status   SARS Coronavirus 2 by RT  PCR NEGATIVE NEGATIVE Final    Comment: (NOTE) SARS-CoV-2 target nucleic acids are NOT DETECTED.  The SARS-CoV-2 RNA is generally detectable in upper respiratory specimens during the acute phase of infection. The lowest concentration of SARS-CoV-2 viral copies this assay can detect is 138 copies/mL. A negative result does not preclude SARS-Cov-2 infection and should not be used as the sole basis for treatment or other patient management decisions. A negative result may occur with  improper specimen collection/handling, submission of specimen other than nasopharyngeal swab, presence of viral mutation(s) within the areas targeted by this assay, and inadequate number of viral copies(<138 copies/mL). A negative result must be combined with clinical observations, patient history, and epidemiological information. The expected result is Negative.  Fact Sheet for Patients:  BloggerCourse.com  Fact Sheet for Healthcare Providers:  SeriousBroker.it  This test is no t yet approved or cleared by the [place] FDA and  has been authorized for detection and/or diagnosis of SARS-CoV-2 by FDA under an Emergency Use Authorization (EUA). This EUA will remain  in effect (meaning this test can be used) for the duration of the COVID-19 declaration under Section 564(b)(1) of the Act, 21 U.S.C.section 360bbb-3(b)(1), unless the authorization is terminated  or revoked sooner.       Influenza A by PCR POSITIVE (A) NEGATIVE Final   Influenza B by PCR NEGATIVE NEGATIVE Final    Comment: (NOTE) The Xpert Xpress SARS-CoV-2/FLU/RSV plus assay is intended as an aid in the diagnosis of influenza from Nasopharyngeal swab specimens and should not be used as a sole basis for treatment. Nasal washings and aspirates are unacceptable for Xpert Xpress SARS-CoV-2/FLU/RSV testing.  Fact Sheet for Patients: BloggerCourse.com  Fact Sheet  for Healthcare Providers: SeriousBroker.it  This test is not yet approved or cleared by the [place] FDA and has been authorized for detection and/or diagnosis of SARS-CoV-2 by FDA under an Emergency Use Authorization (EUA). This EUA will remain in effect (meaning this test can be used) for the duration of the COVID-19 declaration under Section 564(b)(1) of the Act, 21 U.S.C. section 360bbb-3(b)(1), unless the authorization is terminated or revoked.     Resp Syncytial Virus by PCR NEGATIVE NEGATIVE Final    Comment: (NOTE) Fact Sheet for Patients:  BloggerCourse.com  Fact Sheet for Healthcare Providers: SeriousBroker.it  This test is not yet approved or cleared by the [place] FDA and has been authorized for detection and/or diagnosis of SARS-CoV-2 by FDA under an Emergency Use Authorization (EUA). This EUA will remain in effect (meaning this test can be used) for the duration of the COVID-19 declaration under Section 564(b)(1) of the Act, 21 U.S.C. section 360bbb-3(b)(1), unless the authorization is terminated or revoked.  Performed at [facility], [address], [place], [place] [address]      Radiology Studies: ECHO TEE  Result Date: 05/11/2022    TRANSESOPHOGEAL ECHO REPORT   Patient Name:   SUMMIT ARROYAVE Date of Exam: 05/11/2022 Medical Rec #:  [id number]          Height:       68.0 in Accession #:    [id number]         Weight:       186.5 lb Date of Birth:  1950/05/24          BSA:          1.984 m Patient Age:    72 years           BP:           146/119 mmHg Patient Gender: M                  HR:           107 bpm. Exam Location:  Inpatient Procedure: Transesophageal Echo, Cardiac Doppler and Color Doppler Indications:     I50.21 Acute systolic (congestive) heart failure  History:         Patient has prior history of Echocardiogram examinations.                  Cardiomyopathy and  CHF, CAD, Prior CABG; Risk                  Factors:Hypertension and Dyslipidemia.  Sonographer:     Leta Jungling RDCS Referring Phys:  Arty Baumgartner Diagnosing Phys: Weston Brass MD PROCEDURE: TEE procedure time was 10 minutes. The transesophogeal probe was passed without difficulty through the esophogus of the patient. Imaged were obtained with the patient in a left lateral decubitus position. Sedation performed by different physician. The patient was monitored while under deep sedation. Anesthestetic sedation was provided intravenously by Anesthesiology: 80.37mg  of Propofol. Image quality was good. The patient developed no complications during the procedure. A successful direct current cardioversion was performed at 120 joules with 1 attempt.  IMPRESSIONS  1. Left ventricular ejection fraction, by estimation, is 20%. The left ventricle has severely decreased function. The left ventricular internal cavity size was severely dilated.  2. Right ventricular systolic function is moderately reduced. The right ventricular size is not well visualized.  3. Left atrial size was moderately dilated. No left atrial/left atrial appendage thrombus was detected.  4. The mitral valve is degenerative. Mild mitral valve regurgitation with BP 95/46 mmHg.  5. The aortic valve is grossly normal. There is mild calcification of the aortic valve. Aortic valve regurgitation not assessed. FINDINGS  Left Ventricle: Left ventricular ejection fraction, by estimation, is 20%. The left ventricle has severely decreased function. The left ventricular internal cavity size was severely dilated. Right Ventricle: The right ventricular size is not well visualized. Right vetricular wall thickness was not assessed. Right ventricular systolic function is moderately reduced. Left Atrium: Left atrial size was moderately dilated. No  left atrial/left atrial appendage thrombus was detected. Right Atrium: Right atrial size was normal in size.  Pericardium: There is no evidence of pericardial effusion. Mitral Valve: The mitral valve is degenerative in appearance. Mild mitral valve regurgitation. Tricuspid Valve: The tricuspid valve is not assessed. Tricuspid valve regurgitation is not demonstrated. Aortic Valve: The aortic valve is grossly normal. There is mild calcification of the aortic valve. Aortic valve regurgitation not assessed. Pulmonic Valve: The pulmonic valve was not assessed. Pulmonic valve regurgitation is not visualized. Aorta: Aortic root could not be assessed. IAS/Shunts: The interatrial septum was not assessed. Cherlynn Kaiser MD Electronically signed by Cherlynn Kaiser MD Signature Date/Time: 05/11/2022/11:13:57 AM    Final      Scheduled Meds:  amiodarone  200 mg Oral BID   apixaban  5 mg Oral BID   furosemide  40 mg Oral Daily   insulin aspart  0-9 Units Subcutaneous TID WC   insulin glargine-yfgn  10 Units Subcutaneous BID   midodrine  10 mg Oral TID WC   Continuous Infusions:  sodium chloride       LOS: 6 days    Time spent: 27min    Domenic Polite, MD [facility]   05/11/2022, 1:31 PM

## 2022-05-11 NOTE — Progress Notes (Signed)
  Echocardiogram Echocardiogram Transesophageal has been performed.  Darlina Sicilian M 05/11/2022, 9:20 AM

## 2022-05-11 NOTE — Progress Notes (Signed)
   Heart Failure Stewardship Pharmacist Progress Note   PCP: Patient, No Pcp Per PCP-Cardiologist: None    HPI:  72 yo M with PMH of CAD s/p CABG in 2020, HTN, T2DM, and HLD.   He presented to the ED on 1/13 for CHF exacerbation, weakness, and shortness of breath. He was at [facility] for 3-4 days but was wanting to leave so he was discharged. Was in respiratory distress and hypotensive, CCM consulted and was admitted to the ICU. CXR with R pleural effusion and bibasilar atelectasis. Lactate 2.9 Antibiotics started for PNA. Lactate improved to 1.0. ECHO 1/15 showed LVEF 20-25%, global hypokinesis, mild LVH, RV moderately reduced, moderate MR and TR. Remains in afib RVR. TEE/DCCV today.   Current HF Medications: None *also on midodrine 10 mg TID  Prior to admission HF Medications: Diuretic: furosemide 40 mg daily Beta blocker: metoprolol XL 50 mg daily ACE/ARB/ARNI: valsartan 80 mg daily MRA: spironolactone 25 mg daily SGLT2i: Farxiga 10 mg daily *carvedilol and HCTZ still on PTA med list (dc'd from [facility] at discharge)  Pertinent Lab Values: Serum creatinine 1.43, BUN 28, Potassium 4.1, Sodium 137, BNP 1029.1, Magnesium 2.1, A1c 10.6   Vital Signs: Weight: 186 lbs (admission weight: 225 lbs) Blood pressure: 100/70s  Heart rate: 100s - afib I/O: +0.1L yesterday; net -6.9L  Medication Assistance / Insurance Benefits Check: Does the patient have prescription insurance?  Yes Type of insurance plan: [place] Medicaid  Outpatient Pharmacy:  Prior to admission outpatient pharmacy: [facility] Is the patient willing to use [place] pharmacy at discharge? Yes Is the patient willing to transition their outpatient pharmacy to utilize a [facility] outpatient pharmacy?   Pending    Assessment: 1. Acute on chronic systolic CHF (LVEF 67-20%), due to ICM. NYHA class III symptoms. - Off IV lasix. Strict I/Os and daily weights. Keep K>4 and Mg>2.  - GDMT limited by hypotension and  AKI. Now off pressors. Trend BP and creatinine. - Continue midodrine 10 mg TID   Plan: 1) Medication changes recommended at this time: - None  2) Patient assistance: - Entresto copay $0 - Jardiance copay $0  3)  Education  - To be completed prior to discharge  Kerby Nora, PharmD, BCPS Heart Failure Stewardship Pharmacist Phone [phone]

## 2022-05-11 NOTE — Interval H&P Note (Signed)
History and Physical Interval Note:  05/11/2022 8:30 AM  Jerry Myers  has presented today for surgery, with the diagnosis of AFIB.  The various methods of treatment have been discussed with the patient and family. After consideration of risks, benefits and other options for treatment, the patient has consented to  Procedure(s): TRANSESOPHAGEAL ECHOCARDIOGRAM (TEE) (N/A) CARDIOVERSION (N/A) as a surgical intervention.  The patient's history has been reviewed, patient examined, no change in status, stable for surgery.  I have reviewed the patient's chart and labs.  Questions were answered to the patient's satisfaction.     Elouise Munroe

## 2022-05-11 NOTE — Transfer of Care (Signed)
Immediate Anesthesia Transfer of Care Note  Patient: Jerry Myers  Procedure(s) Performed: TRANSESOPHAGEAL ECHOCARDIOGRAM (TEE) CARDIOVERSION  Patient Location: Endoscopy Unit  Anesthesia Type:MAC  Level of Consciousness: awake, alert , and oriented  Airway & Oxygen Therapy: Patient Spontanous Breathing and Patient connected to nasal cannula oxygen  Post-op Assessment: Report given to RN and Post -op Vital signs reviewed and stable  Post vital signs: Reviewed and stable  Last Vitals:  Vitals Value Taken Time  BP 112/66 05/11/22 0912  Temp 35.5 C 05/11/22 0912  Pulse 78 05/11/22 0919  Resp 15 05/11/22 0919  SpO2 95 % 05/11/22 0919  Vitals shown include unvalidated device data.  Last Pain:  Vitals:   05/11/22 0912  TempSrc: Temporal  PainSc:       Patients Stated Pain Goal: 0 (14/48/18 5631)  Complications: No notable events documented.

## 2022-05-12 DIAGNOSIS — I5023 Acute on chronic systolic (congestive) heart failure: Secondary | ICD-10-CM | POA: Diagnosis not present

## 2022-05-12 LAB — BASIC METABOLIC PANEL
Anion gap: 9 (ref 5–15)
BUN: 26 mg/dL — ABNORMAL HIGH (ref 8–23)
CO2: 23 mmol/L (ref 22–32)
Calcium: 8.9 mg/dL (ref 8.9–10.3)
Chloride: 104 mmol/L (ref 98–111)
Creatinine, Ser: 1.35 mg/dL — ABNORMAL HIGH (ref 0.61–1.24)
GFR, Estimated: 55 mL/min — ABNORMAL LOW (ref >60)
Glucose, Bld: 153 mg/dL — ABNORMAL HIGH (ref 70–99)
Potassium: 3.8 mmol/L (ref 3.5–5.1)
Sodium: 136 mmol/L (ref 135–145)

## 2022-05-12 LAB — GLUCOSE, CAPILLARY
Glucose-Capillary: 127 mg/dL — ABNORMAL HIGH (ref 70–99)
Glucose-Capillary: 209 mg/dL — ABNORMAL HIGH (ref 70–99)
Glucose-Capillary: 165 mg/dL — ABNORMAL HIGH (ref 70–99)
Glucose-Capillary: 115 mg/dL — ABNORMAL HIGH (ref 70–99)

## 2022-05-12 MED ORDER — POTASSIUM CHLORIDE CRYS ER 20 MEQ PO TBCR
40.0000 meq | EXTENDED_RELEASE_TABLET | Freq: Once | ORAL | Status: AC
  Administered 2022-05-12: 40 meq via ORAL
  Filled 2022-05-12: qty 2

## 2022-05-12 MED ORDER — FUROSEMIDE 10 MG/ML IJ SOLN
40.0000 mg | Freq: Two times a day (BID) | INTRAMUSCULAR | Status: AC
  Administered 2022-05-12: 40 mg via INTRAVENOUS
  Filled 2022-05-12: qty 4

## 2022-05-12 MED ORDER — NITROGLYCERIN 0.4 MG SL SUBL
SUBLINGUAL_TABLET | SUBLINGUAL | Status: AC
  Filled 2022-05-12: qty 1

## 2022-05-12 NOTE — Anesthesia Postprocedure Evaluation (Signed)
Anesthesia Post Note  Patient: LINWOOD GULLIKSON  Procedure(s) Performed: TRANSESOPHAGEAL ECHOCARDIOGRAM (TEE) CARDIOVERSION     Patient location during evaluation: Endoscopy Anesthesia Type: MAC Level of consciousness: awake Pain management: pain level controlled Vital Signs Assessment: post-procedure vital signs reviewed and stable Respiratory status: spontaneous breathing, nonlabored ventilation and respiratory function stable Cardiovascular status: blood pressure returned to baseline and stable Postop Assessment: no apparent nausea or vomiting Anesthetic complications: no   No notable events documented.  Last Vitals:  Vitals:   05/11/22 2143 05/12/22 0508  BP: 96/82 112/81  Pulse: 77 75  Resp: 19 19  Temp: (!) 36.3 C 36.4 C  SpO2: 97% 94%    Last Pain:  Vitals:   05/12/22 0508  TempSrc: Oral  PainSc:                  Karyl Kinnier Inari Shin

## 2022-05-12 NOTE — Progress Notes (Signed)
Rounding Note    Patient Name: Jerry Myers Date of Encounter: 05/12/2022  [facility] Cardiologist: None   Subjective   Dyspnea improved. No chest pain or sob. Family concerned about no one to keep an eye on him when he goes home.  Inpatient Medications    Scheduled Meds:  amiodarone  200 mg Oral BID   apixaban  5 mg Oral BID   insulin aspart  0-9 Units Subcutaneous TID WC   insulin glargine-yfgn  10 Units Subcutaneous BID   midodrine  10 mg Oral TID WC   Continuous Infusions:  sodium chloride     PRN Meds: acetaminophen, mouth rinse   Vital Signs    Vitals:   05/12/22 0508 05/12/22 0722 05/12/22 0725 05/12/22 1135  BP: 112/81 (!) 134/96 (!) 134/96 99/63  Pulse: 75 89 87 78  Resp: 19 16 19 19   Temp: 97.6 F (36.4 C) 97.8 F (36.6 C)  (!) 97.4 F (36.3 C)  TempSrc: Oral Oral  Oral  SpO2: 94% 99% 98% 92%  Weight: 85.3 kg     Height:        Intake/Output Summary (Last 24 hours) at 05/12/2022 1358 Last data filed at 05/12/2022 1100 Gross per 24 hour  Intake 960 ml  Output 700 ml  Net 260 ml      05/12/2022    5:08 AM 05/11/2022    7:08 AM 05/11/2022    5:59 AM  Last 3 Weights  Weight (lbs) 188 lb 0.8 oz 186 lb 8.2 oz 186 lb 8.2 oz  Weight (kg) 85.3 kg 84.6 kg 84.6 kg      Telemetry    nsr - Personally Reviewed  ECG    none - Personally Reviewed  Physical Exam   GEN: No acute distress.   Neck: No JVD Cardiac: RRR, no murmurs, rubs, or gallops.  Respiratory: Clear to auscultation bilaterally. GI: Soft, nontender, non-distended  MS: No edema; No deformity. Neuro:  Nonfocal  Psych: Normal affect   Labs    High Sensitivity Troponin:   Recent Labs  Lab 05/05/22 1635 05/05/22 2110  TROPONINIHS 93* 83*     Chemistry Recent Labs  Lab 05/06/22 0235 05/06/22 1316 05/07/22 0731 05/08/22 0430 05/10/22 0100 05/11/22 0114 05/12/22 0054  NA 142 137 136   < > 137 137 136  K 3.1* 3.9 3.1*   < > 3.2* 4.1 3.8  CL 104 102 102    < > 99 103 104  CO2 25 21* 26   < > 26 24 23   GLUCOSE 156* 203* 93   < > 105* 168* 153*  BUN 29* 32* 36*   < > 26* 28* 26*  CREATININE 1.72* 1.73* 1.59*   < > 1.42* 1.43* 1.35*  CALCIUM 8.4* 8.3* 8.0*   < > 8.5* 8.4* 8.9  MG 1.7 2.1 2.1  --   --   --   --   PROT 5.6* 6.5  --   --   --  6.1*  --   ALBUMIN 3.2* 3.4*  --   --   --  2.9*  --   AST 18 30  --   --   --  60*  --   ALT 19 25  --   --   --  67*  --   ALKPHOS 62 68  --   --   --  114  --   BILITOT 0.7 0.6  --   --   --  0.9  --   GFRNONAA 41* 41* 46*   < > 52* 51* 55*  ANIONGAP 13 14 8    < > 12 10 9    < > = values in this interval not displayed.    Lipids No results for input(s): "CHOL", "TRIG", "HDL", "LABVLDL", "LDLCALC", "CHOLHDL" in the last 168 hours.  Hematology Recent Labs  Lab 05/06/22 1316 05/07/22 0731 05/11/22 0114  WBC 7.0 4.7 5.6  RBC 4.53 4.10* 4.41  HGB 13.4 12.5* 13.1  HCT 41.5 36.7* 38.5*  MCV 91.6 89.5 87.3  MCH 29.6 30.5 29.7  MCHC 32.3 34.1 34.0  RDW 16.0* 15.6* 15.1  PLT 233 225 248   Thyroid No results for input(s): "TSH", "FREET4" in the last 168 hours.  BNP Recent Labs  Lab 05/05/22 1635 05/06/22 1316  BNP 1,029.1* 955.5*    DDimer No results for input(s): "DDIMER" in the last 168 hours.   Radiology    ECHO TEE  Result Date: 05/11/2022    TRANSESOPHOGEAL ECHO REPORT   Patient Name:   Jerry Myers Date of Exam: 05/11/2022 Medical Rec #:  [id number]          Height:       68.0 in Accession #:    [id number]         Weight:       186.5 lb Date of Birth:  1950/12/27          BSA:          1.984 m Patient Age:    72 years           BP:           146/119 mmHg Patient Gender: M                  HR:           107 bpm. Exam Location:  Inpatient Procedure: Transesophageal Echo, Cardiac Doppler and Color Doppler Indications:     W10.27 Acute systolic (congestive) heart failure  History:         Patient has prior history of Echocardiogram examinations.                  Cardiomyopathy and CHF,  CAD, Prior CABG; Risk                  Factors:Hypertension and Dyslipidemia.  Sonographer:     Darlina Sicilian RDCS Referring Phys:  Cheryln Manly Diagnosing Phys: Cherlynn Kaiser MD PROCEDURE: TEE procedure time was 10 minutes. The transesophogeal probe was passed without difficulty through the esophogus of the patient. Imaged were obtained with the patient in a left lateral decubitus position. Sedation performed by different physician. The patient was monitored while under deep sedation. Anesthestetic sedation was provided intravenously by Anesthesiology: 80.37mg  of Propofol. Image quality was good. The patient developed no complications during the procedure. A successful direct current cardioversion was performed at 120 joules with 1 attempt.  IMPRESSIONS  1. Left ventricular ejection fraction, by estimation, is 20%. The left ventricle has severely decreased function. The left ventricular internal cavity size was severely dilated.  2. Right ventricular systolic function is moderately reduced. The right ventricular size is not well visualized.  3. Left atrial size was moderately dilated. No left atrial/left atrial appendage thrombus was detected.  4. The mitral valve is degenerative. Mild mitral valve regurgitation with BP 95/46 mmHg.  5. The aortic valve is grossly normal. There is mild calcification of the aortic valve.  Aortic valve regurgitation not assessed. FINDINGS  Left Ventricle: Left ventricular ejection fraction, by estimation, is 20%. The left ventricle has severely decreased function. The left ventricular internal cavity size was severely dilated. Right Ventricle: The right ventricular size is not well visualized. Right vetricular wall thickness was not assessed. Right ventricular systolic function is moderately reduced. Left Atrium: Left atrial size was moderately dilated. No left atrial/left atrial appendage thrombus was detected. Right Atrium: Right atrial size was normal in size. Pericardium:  There is no evidence of pericardial effusion. Mitral Valve: The mitral valve is degenerative in appearance. Mild mitral valve regurgitation. Tricuspid Valve: The tricuspid valve is not assessed. Tricuspid valve regurgitation is not demonstrated. Aortic Valve: The aortic valve is grossly normal. There is mild calcification of the aortic valve. Aortic valve regurgitation not assessed. Pulmonic Valve: The pulmonic valve was not assessed. Pulmonic valve regurgitation is not visualized. Aorta: Aortic root could not be assessed. IAS/Shunts: The interatrial septum was not assessed. Cherlynn Kaiser MD Electronically signed by Cherlynn Kaiser MD Signature Date/Time: 05/11/2022/11:13:57 AM    Final     Cardiac Studies   See above.   Patient Profile     72 y.o. male admitted with A on C systolic heart failure and atrial fib/flutter, s/p DCCV  Assessment & Plan    Acute on chronic systolic heart failure - he is nearly euvolemic. His IV lasix is not on med list. Consider more. Atrial fib/flutter - he is maintaining NSR. Acute resp failure - he is much improved, off of bipap. Hypokalemia -repeleted     For questions or updates, please contact [place] Please consult [email or web address] for contact info under   Signed, Cristopher Peru, MD  05/12/2022, 1:58 PM

## 2022-05-12 NOTE — Progress Notes (Signed)
PROGRESS NOTE    Jerry Myers  ITG:[id number] DOB: 1950/10/17 DOA: 05/05/2022 PCP: Patient, No Pcp Per  71/M with history of CAD/CABG 2020, chronic systolic CHF, type 2 diabetes mellitus, hypertension was recently hospitalized at [facility] with CHF and atrial flutter briefly diuresed and discharged home 1/13 per pt request.  Upon arrival home family noticed he was significantly dyspneic with minimal activity, EMS was called and he was brought to [facility], in the ER he was tachypneic, placed on BiPAP, febrile to 102, flu positive, blood pressure was low, he was started on pressors and admitted to ICU, troponin was 93 -Treated with Levophed and diuretics for combination of cardiogenic and septic shock -Weaned off pressors 1/17, started on midodrine -1/18 transferred to [facility] service -1/19 underwent TEE/cardioversion   Subjective: -Feels tired, some shortness of breath this morning  Assessment and Plan:  Acute hypoxic respiratory failure -Secondary to CHF, flu pneumonia, improving -Wean O2 as tolerated, required BiPAP on admission -PT OT eval completed, SNF recommended, patient declines this, agreeable for home health services  Shock-multifactorial, combination of septic and cardiogenic -Treated with Levophed in ICU, -Influenza felt to be the source, blood cultures negative -Now on midodrine 10 Mg TID, completed Tamiflu  Acute on chronic systolic CHF, BiV failure CAD/CABG -Echo with EF of 20-25%, moderately reduced RV, previously EF was 30-35% in 5/20 -Now off Levophed, blood pressure in the 90-110s on midodrine 10 Mg 3 times daily -Diuresed with IV Lasix, he is 7.2 L negative -GDMT limited by hypotension, repeated dose of IV Lasix today -Cards following, underwent cardioversion yesterday -Monitor urine output, BMP in a.m.  Paroxysmal atrial fibs/atrial flutter -Now on oral amiodarone and apixaban -Status post TEE/cardioversion 1/19  AKI/CKD -Baseline creatinine unknown,  was 1.4 in 2021, 1.7 on admission, now down to 1.42, likely his baseline  Influenza -Continue Tamiflu, completed course  Type 2 diabetes mellitus -CBGs are improving continue Semglee and sliding scale insulin   DVT prophylaxis: Apixaban Code Status: Full code Family Communication: None present Disposition Plan: Home with home health services as in 1 to 2 days  Consultants: Cards, PCCM transfer   Procedures: TEE/cardioversion 1/19  Antimicrobials:    Objective: Vitals:   05/11/22 2143 05/12/22 0508 05/12/22 0722 05/12/22 0725  BP: 96/82 112/81 (!) 134/96 (!) 134/96  Pulse: 77 75 89 87  Resp: 19 19 16 19   Temp: (!) 97.3 F (36.3 C) 97.6 F (36.4 C) 97.8 F (36.6 C)   TempSrc: Oral Oral Oral   SpO2: 97% 94% 99% 98%  Weight:  85.3 kg    Height:        Intake/Output Summary (Last 24 hours) at 05/12/2022 1048 Last data filed at 05/12/2022 0900 Gross per 24 hour  Intake 960 ml  Output 800 ml  Net 160 ml   Filed Weights   05/11/22 0559 05/11/22 0708 05/12/22 0508  Weight: 84.6 kg 84.6 kg 85.3 kg    Examination:  General exam: Chronically ill male appears older than stated age, AAOx3, flat affect HEENT: Positive JVD CVS: S1-S2, regular rhythm Lungs: Rare basilar Rales otherwise clear Abdomen: Soft, nontender, bowel sounds present Extremities: Trace edema Skin: No rashes Psychiatry: Flat affect    Data Reviewed:   CBC: Recent Labs  Lab 05/05/22 1635 05/05/22 2119 05/06/22 0235 05/06/22 1316 05/07/22 0731 05/11/22 0114  WBC 5.6  --  5.7 7.0 4.7 5.6  NEUTROABS 3.9  --  4.2  --   --   --   HGB 13.2 13.6  12.1* 13.4 12.5* 13.1  HCT 40.9 40.0 35.5* 41.5 36.7* 38.5*  MCV 91.3  --  89.2 91.6 89.5 87.3  PLT 271  --  220 233 225 248   Basic Metabolic Panel: Recent Labs  Lab 05/06/22 0235 05/06/22 1316 05/07/22 0731 05/08/22 0430 05/09/22 0703 05/10/22 0100 05/11/22 0114 05/12/22 0054  NA 142 137 136 135 134* 137 137 136  K 3.1* 3.9 3.1* 3.5 3.7  3.2* 4.1 3.8  CL 104 102 102 99 99 99 103 104  CO2 25 21* 26 23 23 26 24 23   GLUCOSE 156* 203* 93 102* 108* 105* 168* 153*  BUN 29* 32* 36* 31* 29* 26* 28* 26*  CREATININE 1.72* 1.73* 1.59* 1.35* 1.35* 1.42* 1.43* 1.35*  CALCIUM 8.4* 8.3* 8.0* 8.2* 8.3* 8.5* 8.4* 8.9  MG 1.7 2.1 2.1  --   --   --   --   --   PHOS 4.9*  --  3.8  --   --   --   --   --    GFR: Estimated Creatinine Clearance: 51.1 mL/min (A) (by C-G formula based on SCr of 1.35 mg/dL (H)). Liver Function Tests: Recent Labs  Lab 05/05/22 1635 05/06/22 0235 05/06/22 1316 05/11/22 0114  AST 25 18 30  60*  ALT 19 19 25  67*  ALKPHOS 70 62 68 114  BILITOT 1.0 0.7 0.6 0.9  PROT 6.4* 5.6* 6.5 6.1*  ALBUMIN 3.5 3.2* 3.4* 2.9*   No results for input(s): "LIPASE", "AMYLASE" in the last 168 hours. No results for input(s): "AMMONIA" in the last 168 hours. Coagulation Profile: Recent Labs  Lab 05/11/22 1025  INR 1.6*   Cardiac Enzymes: No results for input(s): "CKTOTAL", "CKMB", "CKMBINDEX", "TROPONINI" in the last 168 hours. BNP (last 3 results) No results for input(s): "PROBNP" in the last 8760 hours. HbA1C: No results for input(s): "HGBA1C" in the last 72 hours. CBG: Recent Labs  Lab 05/11/22 1039 05/11/22 1100 05/11/22 1635 05/11/22 2148 05/12/22 0631  GLUCAP 157* 146* 193* 186* 127*   Lipid Profile: No results for input(s): "CHOL", "HDL", "LDLCALC", "TRIG", "CHOLHDL", "LDLDIRECT" in the last 72 hours. Thyroid Function Tests: No results for input(s): "TSH", "T4TOTAL", "FREET4", "T3FREE", "THYROIDAB" in the last 72 hours. Anemia Panel: No results for input(s): "VITAMINB12", "FOLATE", "FERRITIN", "TIBC", "IRON", "RETICCTPCT" in the last 72 hours. Urine analysis:    Component Value Date/Time   COLORURINE YELLOW 05/05/2022 1642   APPEARANCEUR CLEAR 05/05/2022 1642   LABSPEC 1.012 05/05/2022 1642   PHURINE 5.0 05/05/2022 1642   GLUCOSEU >=500 (A) 05/05/2022 1642   HGBUR MODERATE (A) 05/05/2022 1642    BILIRUBINUR NEGATIVE 05/05/2022 1642   KETONESUR NEGATIVE 05/05/2022 1642   PROTEINUR NEGATIVE 05/05/2022 1642   NITRITE NEGATIVE 05/05/2022 1642   LEUKOCYTESUR NEGATIVE 05/05/2022 1642   Sepsis Labs: @LABRCNTIP (procalcitonin:4,lacticidven:4)  ) Recent Results (from the past 240 hour(s))  Blood culture (routine x 2)     Status: None   Collection Time: 05/05/22  9:05 PM   Specimen: BLOOD  Result Value Ref Range Status   Specimen Description BLOOD RIGHT ANTECUBITAL  Final   Special Requests   Final    BOTTLES DRAWN AEROBIC AND ANAEROBIC Blood Culture adequate volume   Culture   Final    NO GROWTH 5 DAYS Performed at [facility], [address], [place], 05/07/22 [facility]    Report Status 05/10/2022 FINAL  Final  Blood culture (routine x 2)     Status: None   Collection  Time: 05/05/22  9:10 PM   Specimen: BLOOD LEFT WRIST  Result Value Ref Range Status   Specimen Description BLOOD LEFT WRIST  Final   Special Requests   Final    BOTTLES DRAWN AEROBIC AND ANAEROBIC Blood Culture adequate volume   Culture   Final    NO GROWTH 5 DAYS Performed at [facility], [address], [place], [place] [address]    Report Status 05/10/2022 FINAL  Final  MRSA Next Gen by PCR, Nasal     Status: None   Collection Time: 05/05/22 11:30 PM   Specimen: Anterior Nasal Swab  Result Value Ref Range Status   MRSA by PCR Next Gen NOT DETECTED NOT DETECTED Final    Comment: (NOTE) The GeneXpert MRSA Assay (FDA approved for NASAL specimens only), is one component of a comprehensive MRSA colonization surveillance program. It is not intended to diagnose MRSA infection nor to guide or monitor treatment for MRSA infections. Test performance is not FDA approved in patients less than 20 years old. Performed at [facility], [place] [address] [place], [place] [address]   Resp panel by RT-PCR (RSV, Flu A&B, Covid) Anterior Nasal Swab     Status: Abnormal   Collection Time: 05/05/22 11:32  PM   Specimen: Anterior Nasal Swab  Result Value Ref Range Status   SARS Coronavirus 2 by RT PCR NEGATIVE NEGATIVE Final    Comment: (NOTE) SARS-CoV-2 target nucleic acids are NOT DETECTED.  The SARS-CoV-2 RNA is generally detectable in upper respiratory specimens during the acute phase of infection. The lowest concentration of SARS-CoV-2 viral copies this assay can detect is 138 copies/mL. A negative result does not preclude SARS-Cov-2 infection and should not be used as the sole basis for treatment or other patient management decisions. A negative result may occur with  improper specimen collection/handling, submission of specimen other than nasopharyngeal swab, presence of viral mutation(s) within the areas targeted by this assay, and inadequate number of viral copies(<138 copies/mL). A negative result must be combined with clinical observations, patient history, and epidemiological information. The expected result is Negative.  Fact Sheet for Patients:  EntrepreneurPulse.com.au  Fact Sheet for Healthcare Providers:  IncredibleEmployment.be  This test is no t yet approved or cleared by the [place] FDA and  has been authorized for detection and/or diagnosis of SARS-CoV-2 by FDA under an Emergency Use Authorization (EUA). This EUA will remain  in effect (meaning this test can be used) for the duration of the COVID-19 declaration under Section 564(b)(1) of the Act, 21 U.S.C.section 360bbb-3(b)(1), unless the authorization is terminated  or revoked sooner.       Influenza A by PCR POSITIVE (A) NEGATIVE Final   Influenza B by PCR NEGATIVE NEGATIVE Final    Comment: (NOTE) The Xpert Xpress SARS-CoV-2/FLU/RSV plus assay is intended as an aid in the diagnosis of influenza from Nasopharyngeal swab specimens and should not be used as a sole basis for treatment. Nasal washings and aspirates are unacceptable for Xpert Xpress  SARS-CoV-2/FLU/RSV testing.  Fact Sheet for Patients: EntrepreneurPulse.com.au  Fact Sheet for Healthcare Providers: IncredibleEmployment.be  This test is not yet approved or cleared by the [place] FDA and has been authorized for detection and/or diagnosis of SARS-CoV-2 by FDA under an Emergency Use Authorization (EUA). This EUA will remain in effect (meaning this test can be used) for the duration of the COVID-19 declaration under Section 564(b)(1) of the Act, 21 U.S.C. section 360bbb-3(b)(1), unless the authorization is terminated  or revoked.     Resp Syncytial Virus by PCR NEGATIVE NEGATIVE Final    Comment: (NOTE) Fact Sheet for Patients: EntrepreneurPulse.com.au  Fact Sheet for Healthcare Providers: IncredibleEmployment.be  This test is not yet approved or cleared by the [place] FDA and has been authorized for detection and/or diagnosis of SARS-CoV-2 by FDA under an Emergency Use Authorization (EUA). This EUA will remain in effect (meaning this test can be used) for the duration of the COVID-19 declaration under Section 564(b)(1) of the Act, 21 U.S.C. section 360bbb-3(b)(1), unless the authorization is terminated or revoked.  Performed at [facility], [place] [address] [place], [place] [address]      Radiology Studies: ECHO TEE  Result Date: 05/11/2022    TRANSESOPHOGEAL ECHO REPORT   Patient Name:   Jerry Myers Date of Exam: 05/11/2022 Medical Rec #:  [id number]          Height:       68.0 in Accession #:    [id number]         Weight:       186.5 lb Date of Birth:  1950-05-12          BSA:          1.984 m Patient Age:    72 years           BP:           146/119 mmHg Patient Gender: M                  HR:           107 bpm. Exam Location:  Inpatient Procedure: Transesophageal Echo, Cardiac Doppler and Color Doppler Indications:     H47.42 Acute systolic (congestive) heart  failure  History:         Patient has prior history of Echocardiogram examinations.                  Cardiomyopathy and CHF, CAD, Prior CABG; Risk                  Factors:Hypertension and Dyslipidemia.  Sonographer:     Darlina Sicilian RDCS Referring Phys:  Cheryln Manly Diagnosing Phys: Cherlynn Kaiser MD PROCEDURE: TEE procedure time was 10 minutes. The transesophogeal probe was passed without difficulty through the esophogus of the patient. Imaged were obtained with the patient in a left lateral decubitus position. Sedation performed by different physician. The patient was monitored while under deep sedation. Anesthestetic sedation was provided intravenously by Anesthesiology: 80.37mg  of Propofol. Image quality was good. The patient developed no complications during the procedure. A successful direct current cardioversion was performed at 120 joules with 1 attempt.  IMPRESSIONS  1. Left ventricular ejection fraction, by estimation, is 20%. The left ventricle has severely decreased function. The left ventricular internal cavity size was severely dilated.  2. Right ventricular systolic function is moderately reduced. The right ventricular size is not well visualized.  3. Left atrial size was moderately dilated. No left atrial/left atrial appendage thrombus was detected.  4. The mitral valve is degenerative. Mild mitral valve regurgitation with BP 95/46 mmHg.  5. The aortic valve is grossly normal. There is mild calcification of the aortic valve. Aortic valve regurgitation not assessed. FINDINGS  Left Ventricle: Left ventricular ejection fraction, by estimation, is 20%. The left ventricle has severely decreased function. The left ventricular internal cavity size was severely dilated. Right Ventricle: The right ventricular size is not well visualized.  Right vetricular wall thickness was not assessed. Right ventricular systolic function is moderately reduced. Left Atrium: Left atrial size was moderately dilated.  No left atrial/left atrial appendage thrombus was detected. Right Atrium: Right atrial size was normal in size. Pericardium: There is no evidence of pericardial effusion. Mitral Valve: The mitral valve is degenerative in appearance. Mild mitral valve regurgitation. Tricuspid Valve: The tricuspid valve is not assessed. Tricuspid valve regurgitation is not demonstrated. Aortic Valve: The aortic valve is grossly normal. There is mild calcification of the aortic valve. Aortic valve regurgitation not assessed. Pulmonic Valve: The pulmonic valve was not assessed. Pulmonic valve regurgitation is not visualized. Aorta: Aortic root could not be assessed. IAS/Shunts: The interatrial septum was not assessed. Weston Brass MD Electronically signed by Weston Brass MD Signature Date/Time: 05/11/2022/11:13:57 AM    Final      Scheduled Meds:  amiodarone  200 mg Oral BID   apixaban  5 mg Oral BID   furosemide  40 mg Intravenous BID   insulin aspart  0-9 Units Subcutaneous TID WC   insulin glargine-yfgn  10 Units Subcutaneous BID   midodrine  10 mg Oral TID WC   Continuous Infusions:  sodium chloride       LOS: 7 days    Time spent:    Zannie Cove, MD [facility]   05/12/2022, 10:48 AM

## 2022-05-13 ENCOUNTER — Encounter (HOSPITAL_COMMUNITY): Payer: Self-pay | Admitting: Internal Medicine

## 2022-05-13 DIAGNOSIS — I509 Heart failure, unspecified: Secondary | ICD-10-CM | POA: Diagnosis not present

## 2022-05-13 DIAGNOSIS — I5023 Acute on chronic systolic (congestive) heart failure: Secondary | ICD-10-CM | POA: Diagnosis not present

## 2022-05-13 LAB — BASIC METABOLIC PANEL
Anion gap: 11 (ref 5–15)
BUN: 21 mg/dL (ref 8–23)
CO2: 20 mmol/L — ABNORMAL LOW (ref 22–32)
Calcium: 8.8 mg/dL — ABNORMAL LOW (ref 8.9–10.3)
Chloride: 107 mmol/L (ref 98–111)
Creatinine, Ser: 1.32 mg/dL — ABNORMAL HIGH (ref 0.61–1.24)
GFR, Estimated: 57 mL/min — ABNORMAL LOW (ref >60)
Glucose, Bld: 121 mg/dL — ABNORMAL HIGH (ref 70–99)
Potassium: 3.5 mmol/L (ref 3.5–5.1)
Sodium: 138 mmol/L (ref 135–145)

## 2022-05-13 LAB — GLUCOSE, CAPILLARY
Glucose-Capillary: 118 mg/dL — ABNORMAL HIGH (ref 70–99)
Glucose-Capillary: 165 mg/dL — ABNORMAL HIGH (ref 70–99)
Glucose-Capillary: 246 mg/dL — ABNORMAL HIGH (ref 70–99)
Glucose-Capillary: 139 mg/dL — ABNORMAL HIGH (ref 70–99)

## 2022-05-13 MED ORDER — FUROSEMIDE 40 MG PO TABS
40.0000 mg | ORAL_TABLET | Freq: Every day | ORAL | Status: DC
  Administered 2022-05-14 – 2022-05-15 (×2): 40 mg via ORAL
  Filled 2022-05-13 (×2): qty 1

## 2022-05-13 MED ORDER — POTASSIUM CHLORIDE CRYS ER 20 MEQ PO TBCR
40.0000 meq | EXTENDED_RELEASE_TABLET | Freq: Once | ORAL | Status: AC
  Administered 2022-05-13: 40 meq via ORAL
  Filled 2022-05-13: qty 2

## 2022-05-13 MED ORDER — FUROSEMIDE 10 MG/ML IJ SOLN
40.0000 mg | Freq: Every day | INTRAMUSCULAR | Status: DC
  Administered 2022-05-13: 40 mg via INTRAVENOUS
  Filled 2022-05-13: qty 4

## 2022-05-13 NOTE — Progress Notes (Signed)
   05/13/22 0417  Assess: MEWS Score  Temp (!) 97.3 F (36.3 C)  BP 100/60  MAP (mmHg) 71  Pulse Rate 73  Resp (!) 23  SpO2 97 %  O2 Device Room Air  Assess: MEWS Score  MEWS Temp 0  MEWS Systolic 1  MEWS Pulse 0  MEWS RR 1  MEWS LOC 0  MEWS Score 2  MEWS Score Color Yellow  Assess: if the MEWS score is Yellow or Red  Were vital signs taken at a resting state? No  Focused Assessment No change from prior assessment  Does the patient meet 2 or more of the SIRS criteria? No  Does the patient have a confirmed or suspected source of infection? No  Provider and Rapid Response Notified? No  MEWS guidelines implemented *See Row Information* No, other (Comment) (pt got up before VS taken)  Document  Patient Outcome Stabilized after interventions  Progress note created (see row info) Yes  Assess: SIRS CRITERIA  SIRS Temperature  0  SIRS Pulse 0  SIRS Respirations  1  SIRS WBC 0  SIRS Score Sum  1

## 2022-05-13 NOTE — Progress Notes (Signed)
   05/13/22 1000  Mobility  Activity Transferred from bed to chair  Level of Assistance Standby assist, set-up cues, supervision of patient - no hands on  Assistive Device Front wheel walker  Distance Ambulated (ft) 2 ft  Activity Response Tolerated well  Mobility Referral Yes  $Mobility charge 1 Mobility   Mobility Specialist Progress Note  Pt in bed and agreeable. Had no c/o pain throughout transfer. Left in chair w/ all needs met and call bell in reach.   Lucious Groves Mobility Specialist  Please contact via SecureChat or Rehab office at [phone]

## 2022-05-13 NOTE — Progress Notes (Signed)
Rounding Note    Patient Name: Jerry Myers Date of Encounter: 05/13/2022  [facility] Cardiologist: None   Subjective   Feeling better, in chair, less SOB  Inpatient Medications    Scheduled Meds:  amiodarone  200 mg Oral BID   apixaban  5 mg Oral BID   furosemide  40 mg Intravenous Daily   insulin aspart  0-9 Units Subcutaneous TID WC   insulin glargine-yfgn  10 Units Subcutaneous BID   midodrine  10 mg Oral TID WC   potassium chloride  40 mEq Oral Once   Continuous Infusions:  sodium chloride     PRN Meds: acetaminophen, mouth rinse   Vital Signs    Vitals:   05/13/22 0417 05/13/22 0600 05/13/22 0623 05/13/22 0805  BP: 100/60   113/75  Pulse: 73   86  Resp: (!) 23  20 17   Temp: (!) 97.3 F (36.3 C)   98 F (36.7 C)  TempSrc: Oral   Oral  SpO2: 97%   97%  Weight:  81 kg    Height:        Intake/Output Summary (Last 24 hours) at 05/13/2022 1049 Last data filed at 05/13/2022 0805 Gross per 24 hour  Intake 440 ml  Output 1350 ml  Net -910 ml      05/13/2022    6:00 AM 05/12/2022    5:08 AM 05/11/2022    7:08 AM  Last 3 Weights  Weight (lbs) 178 lb 9.6 oz 188 lb 0.8 oz 186 lb 8.2 oz  Weight (kg) 81.012 kg 85.3 kg 84.6 kg      Telemetry    SR - Personally Reviewed  ECG    No new - Personally Reviewed  Physical Exam   GEN: No acute distress.   Neck: No JVD Cardiac: RRR, no murmurs, rubs, or gallops.  Respiratory: mild wheeze left side. GI: Soft, nontender, non-distended  MS: No edema; No deformity. Neuro:  Nonfocal  Psych: Normal affect   Labs    High Sensitivity Troponin:   Recent Labs  Lab 05/05/22 1635 05/05/22 2110  TROPONINIHS 93* 83*     Chemistry Recent Labs  Lab 05/06/22 1316 05/07/22 0731 05/08/22 0430 05/11/22 0114 05/12/22 0054 05/13/22 0052  NA 137 136   < > 137 136 138  K 3.9 3.1*   < > 4.1 3.8 3.5  CL 102 102   < > 103 104 107  CO2 21* 26   < > 24 23 20*  GLUCOSE 203* 93   < > 168* 153*  121*  BUN 32* 36*   < > 28* 26* 21  CREATININE 1.73* 1.59*   < > 1.43* 1.35* 1.32*  CALCIUM 8.3* 8.0*   < > 8.4* 8.9 8.8*  MG 2.1 2.1  --   --   --   --   PROT 6.5  --   --  6.1*  --   --   ALBUMIN 3.4*  --   --  2.9*  --   --   AST 30  --   --  60*  --   --   ALT 25  --   --  67*  --   --   ALKPHOS 68  --   --  114  --   --   BILITOT 0.6  --   --  0.9  --   --   GFRNONAA 41* 46*   < > 51* 55* 57*  ANIONGAP  14 8   < > 10 9 11    < > = values in this interval not displayed.    Lipids No results for input(s): "CHOL", "TRIG", "HDL", "LABVLDL", "LDLCALC", "CHOLHDL" in the last 168 hours.  Hematology Recent Labs  Lab 05/06/22 1316 05/07/22 0731 05/11/22 0114  WBC 7.0 4.7 5.6  RBC 4.53 4.10* 4.41  HGB 13.4 12.5* 13.1  HCT 41.5 36.7* 38.5*  MCV 91.6 89.5 87.3  MCH 29.6 30.5 29.7  MCHC 32.3 34.1 34.0  RDW 16.0* 15.6* 15.1  PLT 233 225 248   Thyroid No results for input(s): "TSH", "FREET4" in the last 168 hours.  BNP Recent Labs  Lab 05/06/22 1316  BNP 955.5*    DDimer No results for input(s): "DDIMER" in the last 168 hours.   Radiology    No results found.  Cardiac Studies   EF 20 to 25%  Patient Profile     72 y.o. male here with acute on chronic systolic heart failure  Assessment & Plan    Acute on chronic systolic heart failure - Transiently on Levophed, weaned off pressors on the 17th.  Started on midodrine.  Completed Tamiflu.  Influenza felt to be source of shock. -EF 20 to 25%, previously 30% in 2020. -Diuresed very well with IV Lasix however goal-directed medical therapy continues to be limited by hypotension. -Would be fine with transitioning to Lasix 40 mg p.o. daily.   Atrial fibrillation/flutter - TEE cardioversion on 05/11/2022 - On oral amiodarone 200 mg twice daily as well as Eliquis.  Continue.  Watch for bleeding.  Influenza - Tamiflu  OK with DC to SNF when able.     For questions or updates, please contact [place] Please  consult [email or web address] for contact info under        Signed, Candee Furbish, MD  05/13/2022, 10:49 AM

## 2022-05-13 NOTE — Progress Notes (Signed)
PROGRESS NOTE    Jerry Myers  ZOX:[id number] DOB: 1950/10/16 DOA: 05/05/2022 PCP: Patient, No Pcp Per  71/M with history of CAD/CABG 8119, chronic systolic CHF, type 2 diabetes mellitus, hypertension was recently hospitalized at [facility] with CHF and atrial flutter briefly diuresed and discharged home 1/13 per pt request.  Upon arrival home family noticed he was significantly dyspneic with minimal activity, EMS was called and he was brought to [facility], in the ER he was tachypneic, placed on BiPAP, febrile to 102, flu positive, blood pressure was low, he was started on pressors and admitted to ICU, troponin was 93 -Treated with Levophed and diuretics for combination of cardiogenic and septic shock -Weaned off pressors 1/17, started on midodrine -1/18 transferred to [facility] service -1/19 underwent TEE/cardioversion   Subjective: -Reports some shortness of breath this morning  Assessment and Plan:  Acute hypoxic respiratory failure -Secondary to CHF, flu pneumonia, improving, required BiPAP on admission -Weaned off O2 -PT OT eval completed, now agreeable to rehab -Discharge planning, TOC following  Shock-multifactorial, combination of septic and cardiogenic -Treated with Levophed in ICU, -Influenza felt to be the source, blood cultures negative -Now on midodrine 10 Mg TID, completed Tamiflu  Acute on chronic systolic CHF, BiV failure CAD/CABG -Echo with EF of 20-25%, moderately reduced RV, previously EF was 30-35% in 5/20 -Now off Levophed, BP now stable on on midodrine 10 Mg 3 times daily -Diuresed with IV Lasix, he is 7.8 L negative, weight down 16lbs, repeat IV Lasix today, resume oral diuretics tomorrow -GDMT limited by hypotension -Cards following, underwent cardioversion 1/19 -BMP in a.m.  Paroxysmal atrial fibs/atrial flutter -Now on oral amiodarone and apixaban -Status post TEE/cardioversion 1/19  AKI/CKD -Baseline creatinine unknown, was 1.4 in 2021, 1.7 on  admission, now down to 1.42, likely his baseline  Influenza -Continue Tamiflu, completed course  Type 2 diabetes mellitus -CBGs are improving continue Semglee and sliding scale insulin   DVT prophylaxis: Apixaban Code Status: Full code Family Communication: None present Disposition Plan: SNF tomorrow  Consultants: Cards, PCCM transfer   Procedures: TEE/cardioversion 1/19  Antimicrobials:    Objective: Vitals:   05/13/22 0417 05/13/22 0600 05/13/22 0623 05/13/22 0805  BP: 100/60   113/75  Pulse: 73   86  Resp: (!) 23  20 17   Temp: (!) 97.3 F (36.3 C)   98 F (36.7 C)  TempSrc: Oral   Oral  SpO2: 97%   97%  Weight:  81 kg    Height:        Intake/Output Summary (Last 24 hours) at 05/13/2022 1050 Last data filed at 05/13/2022 0805 Gross per 24 hour  Intake 440 ml  Output 1350 ml  Net -910 ml   Filed Weights   05/11/22 0708 05/12/22 0508 05/13/22 0600  Weight: 84.6 kg 85.3 kg 81 kg    Examination:  General exam: Chronically ill male laying in bed, appears older than stated age, AAOx3 HEENT: Positive JVD CVS: S1-S2, regular rhythm Lungs: Clear bilaterally, decreased at the bases Abdomen: Soft, nontender, bowel sounds present Extremities: No edema  Skin: No rashes Psychiatry: Flat affect    Data Reviewed:   CBC: Recent Labs  Lab 05/06/22 1316 05/07/22 0731 05/11/22 0114  WBC 7.0 4.7 5.6  HGB 13.4 12.5* 13.1  HCT 41.5 36.7* 38.5*  MCV 91.6 89.5 87.3  PLT 233 225 147   Basic Metabolic Panel: Recent Labs  Lab 05/06/22 1316 05/07/22 0731 05/08/22 0430 05/09/22 0703 05/10/22 0100 05/11/22 0114 05/12/22 0054 05/13/22  0052  NA 137 136   < > 134* 137 137 136 138  K 3.9 3.1*   < > 3.7 3.2* 4.1 3.8 3.5  CL 102 102   < > 99 99 103 104 107  CO2 21* 26   < > 23 26 24 23  20*  GLUCOSE 203* 93   < > 108* 105* 168* 153* 121*  BUN 32* 36*   < > 29* 26* 28* 26* 21  CREATININE 1.73* 1.59*   < > 1.35* 1.42* 1.43* 1.35* 1.32*  CALCIUM 8.3* 8.0*   < >  8.3* 8.5* 8.4* 8.9 8.8*  MG 2.1 2.1  --   --   --   --   --   --   PHOS  --  3.8  --   --   --   --   --   --    < > = values in this interval not displayed.   GFR: Estimated Creatinine Clearance: 47.5 mL/min (A) (by C-G formula based on SCr of 1.32 mg/dL (H)). Liver Function Tests: Recent Labs  Lab 05/06/22 1316 05/11/22 0114  AST 30 60*  ALT 25 67*  ALKPHOS 68 114  BILITOT 0.6 0.9  PROT 6.5 6.1*  ALBUMIN 3.4* 2.9*   No results for input(s): "LIPASE", "AMYLASE" in the last 168 hours. No results for input(s): "AMMONIA" in the last 168 hours. Coagulation Profile: Recent Labs  Lab 05/11/22 1025  INR 1.6*   Cardiac Enzymes: No results for input(s): "CKTOTAL", "CKMB", "CKMBINDEX", "TROPONINI" in the last 168 hours. BNP (last 3 results) No results for input(s): "PROBNP" in the last 8760 hours. HbA1C: No results for input(s): "HGBA1C" in the last 72 hours. CBG: Recent Labs  Lab 05/12/22 0631 05/12/22 1128 05/12/22 1614 05/12/22 2135 05/13/22 0559  GLUCAP 127* 209* 165* 115* 118*   Lipid Profile: No results for input(s): "CHOL", "HDL", "LDLCALC", "TRIG", "CHOLHDL", "LDLDIRECT" in the last 72 hours. Thyroid Function Tests: No results for input(s): "TSH", "T4TOTAL", "FREET4", "T3FREE", "THYROIDAB" in the last 72 hours. Anemia Panel: No results for input(s): "VITAMINB12", "FOLATE", "FERRITIN", "TIBC", "IRON", "RETICCTPCT" in the last 72 hours. Urine analysis:    Component Value Date/Time   COLORURINE YELLOW 05/05/2022 1642   APPEARANCEUR CLEAR 05/05/2022 1642   LABSPEC 1.012 05/05/2022 1642   PHURINE 5.0 05/05/2022 1642   GLUCOSEU >=500 (A) 05/05/2022 1642   HGBUR MODERATE (A) 05/05/2022 1642   BILIRUBINUR NEGATIVE 05/05/2022 1642   KETONESUR NEGATIVE 05/05/2022 1642   PROTEINUR NEGATIVE 05/05/2022 1642   NITRITE NEGATIVE 05/05/2022 1642   LEUKOCYTESUR NEGATIVE 05/05/2022 1642   Sepsis Labs: @LABRCNTIP (procalcitonin:4,lacticidven:4)  ) Recent Results (from  the past 240 hour(s))  Blood culture (routine x 2)     Status: None   Collection Time: 05/05/22  9:05 PM   Specimen: BLOOD  Result Value Ref Range Status   Specimen Description BLOOD RIGHT ANTECUBITAL  Final   Special Requests   Final    BOTTLES DRAWN AEROBIC AND ANAEROBIC Blood Culture adequate volume   Culture   Final    NO GROWTH 5 DAYS Performed at [facility], [address], [place], [place] [address]    Report Status 05/10/2022 FINAL  Final  Blood culture (routine x 2)     Status: None   Collection Time: 05/05/22  9:10 PM   Specimen: BLOOD LEFT WRIST  Result Value Ref Range Status   Specimen Description BLOOD LEFT WRIST  Final   Special Requests   Final  BOTTLES DRAWN AEROBIC AND ANAEROBIC Blood Culture adequate volume   Culture   Final    NO GROWTH 5 DAYS Performed at [facility], [address], [place], [place] [address]    Report Status 05/10/2022 FINAL  Final  MRSA Next Gen by PCR, Nasal     Status: None   Collection Time: 05/05/22 11:30 PM   Specimen: Anterior Nasal Swab  Result Value Ref Range Status   MRSA by PCR Next Gen NOT DETECTED NOT DETECTED Final    Comment: (NOTE) The GeneXpert MRSA Assay (FDA approved for NASAL specimens only), is one component of a comprehensive MRSA colonization surveillance program. It is not intended to diagnose MRSA infection nor to guide or monitor treatment for MRSA infections. Test performance is not FDA approved in patients less than 12 years old. Performed at [facility], [address] [place], [place] [address]   Resp panel by RT-PCR (RSV, Flu A&B, Covid) Anterior Nasal Swab     Status: Abnormal   Collection Time: 05/05/22 11:32 PM   Specimen: Anterior Nasal Swab  Result Value Ref Range Status   SARS Coronavirus 2 by RT PCR NEGATIVE NEGATIVE Final    Comment: (NOTE) SARS-CoV-2 target nucleic acids are NOT DETECTED.  The SARS-CoV-2 RNA is generally detectable in upper respiratory specimens  during the acute phase of infection. The lowest concentration of SARS-CoV-2 viral copies this assay can detect is 138 copies/mL. A negative result does not preclude SARS-Cov-2 infection and should not be used as the sole basis for treatment or other patient management decisions. A negative result may occur with  improper specimen collection/handling, submission of specimen other than nasopharyngeal swab, presence of viral mutation(s) within the areas targeted by this assay, and inadequate number of viral copies(<138 copies/mL). A negative result must be combined with clinical observations, patient history, and epidemiological information. The expected result is Negative.  Fact Sheet for Patients:  BloggerCourse.com  Fact Sheet for Healthcare Providers:  SeriousBroker.it  This test is no t yet approved or cleared by the [place] FDA and  has been authorized for detection and/or diagnosis of SARS-CoV-2 by FDA under an Emergency Use Authorization (EUA). This EUA will remain  in effect (meaning this test can be used) for the duration of the COVID-19 declaration under Section 564(b)(1) of the Act, 21 U.S.C.section 360bbb-3(b)(1), unless the authorization is terminated  or revoked sooner.       Influenza A by PCR POSITIVE (A) NEGATIVE Final   Influenza B by PCR NEGATIVE NEGATIVE Final    Comment: (NOTE) The Xpert Xpress SARS-CoV-2/FLU/RSV plus assay is intended as an aid in the diagnosis of influenza from Nasopharyngeal swab specimens and should not be used as a sole basis for treatment. Nasal washings and aspirates are unacceptable for Xpert Xpress SARS-CoV-2/FLU/RSV testing.  Fact Sheet for Patients: BloggerCourse.com  Fact Sheet for Healthcare Providers: SeriousBroker.it  This test is not yet approved or cleared by the [place] FDA and has been authorized for detection  and/or diagnosis of SARS-CoV-2 by FDA under an Emergency Use Authorization (EUA). This EUA will remain in effect (meaning this test can be used) for the duration of the COVID-19 declaration under Section 564(b)(1) of the Act, 21 U.S.C. section 360bbb-3(b)(1), unless the authorization is terminated or revoked.     Resp Syncytial Virus by PCR NEGATIVE NEGATIVE Final    Comment: (NOTE) Fact Sheet for Patients: BloggerCourse.com  Fact Sheet for Healthcare Providers: SeriousBroker.it  This test is not  yet approved or cleared by the [place] and has been authorized for detection and/or diagnosis of SARS-CoV-2 by FDA under an Emergency Use Authorization (EUA). This EUA will remain in effect (meaning this test can be used) for the duration of the COVID-19 declaration under Section 564(b)(1) of the Act, 21 U.S.C. section 360bbb-3(b)(1), unless the authorization is terminated or revoked.  Performed at [facility], [address], [place], [place] [address]      Radiology Studies: No results found.   Scheduled Meds:  amiodarone  200 mg Oral BID   apixaban  5 mg Oral BID   furosemide  40 mg Intravenous Daily   insulin aspart  0-9 Units Subcutaneous TID WC   insulin glargine-yfgn  10 Units Subcutaneous BID   midodrine  10 mg Oral TID WC   potassium chloride  40 mEq Oral Once   Continuous Infusions:  sodium chloride       LOS: 8 days    Time spent:    Zannie Cove, MD [facility]   05/13/2022, 10:50 AM

## 2022-05-13 NOTE — Plan of Care (Signed)
  Problem: Elimination: Goal: Will not experience complications related to bowel motility Outcome: Completed/Met Goal: Will not experience complications related to urinary retention Outcome: Completed/Met   Problem: Pain Managment: Goal: General experience of comfort will improve Outcome: Completed/Met   

## 2022-05-14 DIAGNOSIS — I5023 Acute on chronic systolic (congestive) heart failure: Secondary | ICD-10-CM | POA: Diagnosis not present

## 2022-05-14 LAB — GLUCOSE, CAPILLARY
Glucose-Capillary: 138 mg/dL — ABNORMAL HIGH (ref 70–99)
Glucose-Capillary: 227 mg/dL — ABNORMAL HIGH (ref 70–99)
Glucose-Capillary: 187 mg/dL — ABNORMAL HIGH (ref 70–99)
Glucose-Capillary: 153 mg/dL — ABNORMAL HIGH (ref 70–99)

## 2022-05-14 LAB — BASIC METABOLIC PANEL
Anion gap: 9 (ref 5–15)
BUN: 19 mg/dL (ref 8–23)
CO2: 22 mmol/L (ref 22–32)
Calcium: 8.9 mg/dL (ref 8.9–10.3)
Chloride: 110 mmol/L (ref 98–111)
Creatinine, Ser: 1.43 mg/dL — ABNORMAL HIGH (ref 0.61–1.24)
GFR, Estimated: 51 mL/min — ABNORMAL LOW (ref >60)
Glucose, Bld: 161 mg/dL — ABNORMAL HIGH (ref 70–99)
Potassium: 3.5 mmol/L (ref 3.5–5.1)
Sodium: 141 mmol/L (ref 135–145)

## 2022-05-14 MED ORDER — POTASSIUM CHLORIDE CRYS ER 20 MEQ PO TBCR
40.0000 meq | EXTENDED_RELEASE_TABLET | Freq: Once | ORAL | Status: AC
  Administered 2022-05-14: 40 meq via ORAL
  Filled 2022-05-14: qty 2

## 2022-05-14 NOTE — Progress Notes (Signed)
Physical Therapy Treatment Patient Details Name: Jerry Myers MRN: [id number] DOB: 01-16-1951 Today's Date: 05/14/2022   History of Present Illness Pt is a 72 y.o. M who presents 05/05/2022 for evaluation of acute on chronic systolic heart failure and influenza A. Significant PMH: CAD s/p CABG x 4, DM, HTN, HLD.    PT Comments    Pt  was seen for mobility with care and discussion of his concerns with having to stay in the hospital.  Pt is motivated to walk and move, but is unsafe and a definite appropriate candidate for SNF care due to weakness, inabiltiy to maneuver safely on walker and his lack of awareness that he is not safely able to go home immediately.  Follow up with goals of PT and monitor his progress toward all goals.  Continue to encourage mobility and reinforcement of safe choices.   Recommendations for follow up therapy are one component of a multi-disciplinary discharge planning process, led by the attending physician.  Recommendations may be updated based on patient status, additional functional criteria and insurance authorization.  Follow Up Recommendations  Skilled nursing-short term rehab (<3 hours/day) Can patient physically be transported by private vehicle: Yes   Assistance Recommended at Discharge Frequent or constant Supervision/Assistance  Patient can return home with the following A little help with walking and/or transfers;A little help with bathing/dressing/bathroom;Assistance with cooking/housework;Assist for transportation;Help with stairs or ramp for entrance   Equipment Recommendations  Rolling walker (2 wheels)    Recommendations for Other Services       Precautions / Restrictions Precautions Precautions: Fall Precaution Comments: monitor safety awareness Restrictions Weight Bearing Restrictions: No Other Position/Activity Restrictions: unsafe with use of walker     Mobility  Bed Mobility Overal bed mobility: Needs Assistance Bed Mobility:  Supine to Sit, Sit to Supine     Supine to sit: Min guard, Min assist Sit to supine: Supervision   General bed mobility comments: pt is using rail and HOB elevated to manage with lines assisted, completely unaware of them    Transfers Overall transfer level: Needs assistance Equipment used: Rolling walker (2 wheels) Transfers: Sit to/from Stand Sit to Stand: Min guard, Mod assist, Min assist           General transfer comment: requires help from lower surfaces of mod assist, and less as height increases, but poor awareness of how to assist himself    Ambulation/Gait Ambulation/Gait assistance: Min assist Gait Distance (Feet): 150 Feet Assistive device: Rolling walker (2 wheels) Gait Pattern/deviations: Decreased step length - right, Decreased step length - left, Decreased dorsiflexion - right, Decreased dorsiflexion - left, Decreased stride length Gait velocity: decreased     General Gait Details: tends to walk in more flat footed presentation, unaware of determining how long steps should be and tends to walk more flexed at the knees as he progresses   Stairs             Wheelchair Mobility    Modified Rankin (Stroke Patients Only)       Balance Overall balance assessment: Needs assistance Sitting-balance support: Feet supported Sitting balance-Leahy Scale: Fair     Standing balance support: Bilateral upper extremity supported, During functional activity Standing balance-Leahy Scale: Poor                              Cognition Arousal/Alertness: Awake/alert Behavior During Therapy: Flat affect Overall Cognitive Status: No family/caregiver present to determine baseline cognitive  functioning                                 General Comments: poor safety awareness and low insight into his condition and consequences of the issues        Exercises      General Comments General comments (skin integrity, edema, etc.): pt is a  bit unsteady today as compared to previous notes, esp to stand from lower surfaces and navigate obstacles on the hallway      Pertinent Vitals/Pain Pain Assessment Pain Assessment: No/denies pain Pain Intervention(s): Monitored during session    Home Living                          Prior Function            PT Goals (current goals can now be found in the care plan section) Acute Rehab PT Goals Patient Stated Goal: to go home Progress towards PT goals: Progressing toward goals    Frequency    Min 3X/week      PT Plan Current plan remains appropriate    Co-evaluation              AM-PAC PT "6 Clicks" Mobility   Outcome Measure  Help needed turning from your back to your side while in a flat bed without using bedrails?: A Little Help needed moving from lying on your back to sitting on the side of a flat bed without using bedrails?: A Little Help needed moving to and from a bed to a chair (including a wheelchair)?: A Little Help needed standing up from a chair using your arms (e.g., wheelchair or bedside chair)?: A Lot Help needed to walk in hospital room?: A Little Help needed climbing 3-5 steps with a railing? : Total 6 Click Score: 15    End of Session Equipment Utilized During Treatment: Gait belt;Oxygen Activity Tolerance: Patient tolerated treatment well;Patient limited by fatigue Patient left: with call bell/phone within reach;with bed alarm set Nurse Communication: Mobility status PT Visit Diagnosis: Unsteadiness on feet (R26.81);Muscle weakness (generalized) (M62.81);Difficulty in walking, not elsewhere classified (R26.2)     Time: 2778-2423 PT Time Calculation (min) (ACUTE ONLY): 28 min  Charges:  $Gait Training: 8-22 mins $Therapeutic Activity: 8-22 mins       Ramond Dial 05/14/2022, 4:50 PM  Mee Hives, PT PhD Acute Rehab Dept. Number: [place]

## 2022-05-14 NOTE — TOC Progression Note (Signed)
  Transition of Care [facility]) - Progression Note    Patient Details  Name: Jerry Myers MRN: [id number] Date of Birth: 03/07/1951  Transition of [facility]) CM/SW A[place], LCSW Phone Number: 05/14/2022, 11:37 AM  Clinical Narrative:     CSW spoke with [name] on the phone regarding pt going to SNF. Sister explained that pt has now changed his mind and is agreeable to going to SNF.   CSW then went to go speak with pt at bedside. Pt stated that his family wants him to go to rehab and he is willing to go to get stronger. CSW completed fl2 and faxed out to facilities in [place], per sister's request. TOC will continue to follow.        Expected Discharge Plan and Services                                               Social Determinants of Health (SDOH) Interventions SDOH Screenings   Food Insecurity: No Food Insecurity (05/06/2022)  Housing: Low Risk  (05/10/2022)  Transportation Needs: No Transportation Needs (05/10/2022)  Utilities: Not At Risk (05/10/2022)  Alcohol Screen: Low Risk  (05/10/2022)  Financial Resource Strain: Medium Risk (05/10/2022)  Tobacco Use: Low Risk  (05/13/2022)    Readmission Risk Interventions     No data to display         Beckey Rutter, MSW, LCSWA, LCASA Transitions of Care  Clinical Social Worker I

## 2022-05-14 NOTE — NC FL2 (Signed)
[place] LEVEL OF CARE FORM     IDENTIFICATION  Patient Name: Jerry Myers Birthdate: 1950/07/19 Sex: male Admission Date (Current Location): 05/05/2022  [facility] [place] Number:  Herbalist and Address:         Provider Number: [id number]  Attending Physician Name and Address:  Domenic Polite, MD  Relative Name and Phone Number:       Current Level of Care: Hospital Recommended Level of Care: [place] Prior Approval Number:    Date Approved/Denied:   PASRR Number: [id number]  Discharge Plan: SNF    Current Diagnoses: Patient Active Problem List   Diagnosis Date Noted   Acute on chronic congestive heart failure ([place]) 05/06/2022   Septic shock ([place]) 05/06/2022   Influenzal pneumonia 05/06/2022   Acute on chronic systolic (congestive) heart failure ([place]) 05/05/2022   Acute on chronic systolic CHF (congestive heart failure) ([place]) 05/05/2022   History of kidney stones    GERD (gastroesophageal reflux disease)    Dyspnea    Depression    Coronary artery disease    Cataract    Arthritis    Anxiety    CAD (coronary artery disease) 01/26/2019   Atrial flutter by electrocardiogram ([place]) 09/09/2018   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   Chest pain 06/05/2018   Abnormal nuclear cardiac imaging test 06/03/2018   NSVT (nonsustained ventricular tachycardia) ([place]) 06/03/2018   Bradycardia 03/18/2018   Cardiomyopathy ([place]) 04/19/2015   Coronary artery disease of native artery of native heart with stable angina pectoris ([place]) 03/18/2015   Essential hypertension 12/29/2014   Type 2 diabetes mellitus without complication ([place]) 47/82/9562   Ventricular extrasystoles 12/29/2014    Orientation RESPIRATION BLADDER Height & Weight     Self, Time, Situation, Place  Normal Incontinent Weight: 177 lb (80.3 kg) Height:  5\' 8"  (172.7 cm)  BEHAVIORAL SYMPTOMS/MOOD NEUROLOGICAL BOWEL NUTRITION STATUS      Incontinent Diet (See dc summary)   AMBULATORY STATUS COMMUNICATION OF NEEDS Skin   Extensive Assist Verbally Normal                       Personal Care Assistance Level of Assistance  Bathing, Feeding, Dressing Bathing Assistance: Maximum assistance Feeding assistance: Limited assistance Dressing Assistance: Maximum assistance     Functional Limitations Info  Sight, Hearing, Speech Sight Info: Impaired Hearing Info: Impaired Speech Info: Adequate    SPECIAL CARE FACTORS FREQUENCY  PT (By licensed PT), OT (By licensed OT)     PT Frequency: 5xweek OT Frequency: 5xweek            Contractures Contractures Info: Not present    Additional Factors Info  Code Status, Allergies Code Status Info: Full Allergies Info: Trulicity (Dulaglutide)           Current Medications (05/14/2022):  This is the current hospital active medication list Current Facility-Administered Medications  Medication Dose Route Frequency Provider Last Rate Last Admin   0.9 %  sodium chloride infusion  250 mL Intravenous Continuous Chesley Mires, MD       acetaminophen (TYLENOL) tablet 650 mg  650 mg Oral Q6H PRN Mauri Brooklyn, MD   650 mg at 05/06/22 0017   amiodarone (PACERONE) tablet 200 mg  200 mg Oral BID Jacky Kindle, MD   200 mg at 05/14/22 0933   apixaban (ELIQUIS) tablet 5 mg  5 mg Oral BID Erenest Blank, RPH   5 mg at 05/14/22 1308  furosemide (LASIX) tablet 40 mg  40 mg Oral Daily Jerline Pain, MD   40 mg at 05/14/22 0933   insulin aspart (novoLOG) injection 0-9 Units  0-9 Units Subcutaneous TID [facility] Domenic Polite, MD   1 Units at 05/14/22 0614   insulin glargine-yfgn (SEMGLEE) injection 10 Units  10 Units Subcutaneous BID Jacky Kindle, MD   10 Units at 05/14/22 0933   midodrine (PROAMATINE) tablet 10 mg  10 mg Oral TID WC Jacky Kindle, MD   10 mg at 05/14/22 6063   Oral care mouth rinse  15 mL Mouth Rinse PRN Jacky Kindle, MD         Discharge Medications: Please see discharge summary for a list of discharge  medications.  Relevant Imaging Results:  Relevant Lab Results:   Additional Information SSN: [id number]  Beckey Rutter, MSW, [place] Transitions of Care  Clinical Social Worker I

## 2022-05-14 NOTE — Progress Notes (Signed)
Mobility Specialist Progress Note:   05/14/22 1133  Mobility  Activity Ambulated with assistance in hallway  Level of Assistance Contact guard assist, steadying assist  Assistive Device Front wheel walker  Distance Ambulated (ft) 250 ft  Activity Response Tolerated well  $Mobility charge 1 Mobility   Pt in bed willing to participate in mobility. No complaints of pain. Left EOB with call bell in reach and all needs met.   Gareth Eagle Odies Desa Mobility Specialist Please contact via [employer] or  Rehab Office at [phone]

## 2022-05-14 NOTE — Care Management Important Message (Signed)
Important Message  Patient Details  Name: Jerry Myers MRN: [id number] Date of Birth: 12-May-1950   Medicare Important Message Given:  Yes     Shelda Altes 05/14/2022, 10:36 AM

## 2022-05-14 NOTE — TOC Progression Note (Signed)
Transition of Care [facility]) - Progression Note    Patient Details  Name: Jerry Myers MRN: [id number] Date of Birth: 1950/10/21  Transition of Care [facility]) CM/SW Contact  Zenon Mayo, RN Phone Number: 05/14/2022, 11:32 AM  Clinical Narrative:     Patient and family now wants SNF, CSW is starting SNF work up.        Expected Discharge Plan and Services                                               Social Determinants of Health (SDOH) Interventions SDOH Screenings   Food Insecurity: No Food Insecurity (05/06/2022)  Housing: Low Risk  (05/10/2022)  Transportation Needs: No Transportation Needs (05/10/2022)  Utilities: Not At Risk (05/10/2022)  Alcohol Screen: Low Risk  (05/10/2022)  Financial Resource Strain: Medium Risk (05/10/2022)  Tobacco Use: Low Risk  (05/13/2022)    Readmission Risk Interventions     No data to display

## 2022-05-14 NOTE — Progress Notes (Signed)
PROGRESS NOTE    AUDWIN SEMPER  BPZ:[id number] DOB: 16-Dec-1950 DOA: 05/05/2022 PCP: Patient, No Pcp Per  71/M with history of CAD/CABG 2020, chronic systolic CHF, type 2 diabetes mellitus, hypertension was recently hospitalized at [facility] with CHF and atrial flutter briefly diuresed and discharged home 1/13 per pt request.  Upon arrival home family noticed he was significantly dyspneic with minimal activity, EMS was called and he was brought to [facility], in the ER he was tachypneic, placed on BiPAP, febrile to 102, flu positive, blood pressure was low, he was started on pressors and admitted to ICU, troponin was 93 -Treated with Levophed and diuretics for combination of cardiogenic and septic shock -Weaned off pressors 1/17, started on midodrine -1/18 transferred to [facility] service -1/19 underwent TEE/cardioversion, declines rehab -1/20: Now agrees to rehab, [facility] consulted   Subjective: -Feels better overall, waiting for physical therapy  Assessment and Plan:  Acute hypoxic respiratory failure -Secondary to CHF, flu pneumonia, improving, required BiPAP on admission -Weaned off O2 -PT OT eval completed, now agreeable to rehab -Discharge planning, TOC following  Shock-multifactorial, combination of septic and cardiogenic -Treated with Levophed in ICU, -Influenza felt to be the source, blood cultures negative -Now on midodrine 10 Mg TID, completed Tamiflu -Cut down midodrine if blood pressure trends up further  Acute on chronic systolic CHF, BiV failure CAD/CABG -Echo with EF of 20-25%, moderately reduced RV, previously EF was 30-35% in 5/20 -Now off Levophed, BP now stable on on midodrine 10 Mg 3 times daily -Diuresed with IV Lasix, he is 7.8 L negative, weight down 16lbs, -Now felt to be euvolemic,-GDMT limited by hypotension changed to oral Lasix 40 Mg daily -Cards following, underwent cardioversion 1/19 -BMP in a.m.  Paroxysmal atrial fibs/atrial flutter -Now on oral  amiodarone and apixaban -Status post TEE/cardioversion 1/19  AKI/CKD -Baseline creatinine unknown, was 1.4 in 2021, 1.7 on admission, now down to 1.42, likely his baseline  Influenza -Continue Tamiflu, completed course  Type 2 diabetes mellitus -CBGs are improving continue Semglee and sliding scale insulin   DVT prophylaxis: Apixaban Code Status: Full code Family Communication: None present Disposition Plan: SNF when bed available  Consultants: Cards, PCCM transfer   Procedures: TEE/cardioversion 1/19  Antimicrobials:    Objective: Vitals:   05/13/22 2057 05/14/22 0456 05/14/22 0600 05/14/22 0917  BP: (!) 124/91 105/79  109/80  Pulse:  77  77  Resp:  (!) 21  14  Temp:  (!) 97.2 F (36.2 C)  97.7 F (36.5 C)  TempSrc:  Oral  Oral  SpO2:  99%  94%  Weight:   80.3 kg   Height:        Intake/Output Summary (Last 24 hours) at 05/14/2022 1140 Last data filed at 05/14/2022 0500 Gross per 24 hour  Intake 320 ml  Output 400 ml  Net -80 ml   Filed Weights   05/12/22 0508 05/13/22 0600 05/14/22 0600  Weight: 85.3 kg 81 kg 80.3 kg    Examination:  General exam: Chronically ill male appears older than stated age, AAOx3, cognitive deficits HEENT: No JVD CVS: S1-S2, regular rhythm Lungs: Decreased breath sounds the bases otherwise clear Abdomen: Soft, nontender, bowel sounds present  Extremities: No edema  Skin: No rashes Psychiatry: Flat affect    Data Reviewed:   CBC: Recent Labs  Lab 05/11/22 0114  WBC 5.6  HGB 13.1  HCT 38.5*  MCV 87.3  PLT 248   Basic Metabolic Panel: Recent Labs  Lab 05/10/22 0100 05/11/22 0114  05/12/22 0054 05/13/22 0052 05/14/22 0031  NA 137 137 136 138 141  K 3.2* 4.1 3.8 3.5 3.5  CL 99 103 104 107 110  CO2 26 24 23  20* 22  GLUCOSE 105* 168* 153* 121* 161*  BUN 26* 28* 26* 21 19  CREATININE 1.42* 1.43* 1.35* 1.32* 1.43*  CALCIUM 8.5* 8.4* 8.9 8.8* 8.9   GFR: Estimated Creatinine Clearance: 43.8 mL/min (A) (by C-G  formula based on SCr of 1.43 mg/dL (H)). Liver Function Tests: Recent Labs  Lab 05/11/22 0114  AST 60*  ALT 67*  ALKPHOS 114  BILITOT 0.9  PROT 6.1*  ALBUMIN 2.9*   No results for input(s): "LIPASE", "AMYLASE" in the last 168 hours. No results for input(s): "AMMONIA" in the last 168 hours. Coagulation Profile: Recent Labs  Lab 05/11/22 1025  INR 1.6*   Cardiac Enzymes: No results for input(s): "CKTOTAL", "CKMB", "CKMBINDEX", "TROPONINI" in the last 168 hours. BNP (last 3 results) No results for input(s): "PROBNP" in the last 8760 hours. HbA1C: No results for input(s): "HGBA1C" in the last 72 hours. CBG: Recent Labs  Lab 05/13/22 0559 05/13/22 1110 05/13/22 1551 05/13/22 2043 05/14/22 0558  GLUCAP 118* 165* 246* 139* 138*   Lipid Profile: No results for input(s): "CHOL", "HDL", "LDLCALC", "TRIG", "CHOLHDL", "LDLDIRECT" in the last 72 hours. Thyroid Function Tests: No results for input(s): "TSH", "T4TOTAL", "FREET4", "T3FREE", "THYROIDAB" in the last 72 hours. Anemia Panel: No results for input(s): "VITAMINB12", "FOLATE", "FERRITIN", "TIBC", "IRON", "RETICCTPCT" in the last 72 hours. Urine analysis:    Component Value Date/Time   COLORURINE YELLOW 05/05/2022 1642   APPEARANCEUR CLEAR 05/05/2022 1642   LABSPEC 1.012 05/05/2022 1642   PHURINE 5.0 05/05/2022 1642   GLUCOSEU >=500 (A) 05/05/2022 1642   HGBUR MODERATE (A) 05/05/2022 1642   BILIRUBINUR NEGATIVE 05/05/2022 1642   KETONESUR NEGATIVE 05/05/2022 1642   PROTEINUR NEGATIVE 05/05/2022 1642   NITRITE NEGATIVE 05/05/2022 1642   LEUKOCYTESUR NEGATIVE 05/05/2022 1642   Sepsis Labs: @LABRCNTIP (procalcitonin:4,lacticidven:4)  ) Recent Results (from the past 240 hour(s))  Blood culture (routine x 2)     Status: None   Collection Time: 05/05/22  9:05 PM   Specimen: BLOOD  Result Value Ref Range Status   Specimen Description BLOOD RIGHT ANTECUBITAL  Final   Special Requests   Final    BOTTLES DRAWN AEROBIC  AND ANAEROBIC Blood Culture adequate volume   Culture   Final    NO GROWTH 5 DAYS Performed at [facility], [place] [address], [place], [place] [address]    Report Status 05/10/2022 FINAL  Final  Blood culture (routine x 2)     Status: None   Collection Time: 05/05/22  9:10 PM   Specimen: BLOOD LEFT WRIST  Result Value Ref Range Status   Specimen Description BLOOD LEFT WRIST  Final   Special Requests   Final    BOTTLES DRAWN AEROBIC AND ANAEROBIC Blood Culture adequate volume   Culture   Final    NO GROWTH 5 DAYS Performed at [facility], [place] [address] [place], [place] [address]    Report Status 05/10/2022 FINAL  Final  MRSA Next Gen by PCR, Nasal     Status: None   Collection Time: 05/05/22 11:30 PM   Specimen: Anterior Nasal Swab  Result Value Ref Range Status   MRSA by PCR Next Gen NOT DETECTED NOT DETECTED Final    Comment: (NOTE) The GeneXpert MRSA Assay (FDA approved for NASAL specimens only), is one  component of a comprehensive MRSA colonization surveillance program. It is not intended to diagnose MRSA infection nor to guide or monitor treatment for MRSA infections. Test performance is not FDA approved in patients less than 28 years old. Performed at [facility], [place] [address] [place], [place] [address]   Resp panel by RT-PCR (RSV, Flu A&B, Covid) Anterior Nasal Swab     Status: Abnormal   Collection Time: 05/05/22 11:32 PM   Specimen: Anterior Nasal Swab  Result Value Ref Range Status   SARS Coronavirus 2 by RT PCR NEGATIVE NEGATIVE Final    Comment: (NOTE) SARS-CoV-2 target nucleic acids are NOT DETECTED.  The SARS-CoV-2 RNA is generally detectable in upper respiratory specimens during the acute phase of infection. The lowest concentration of SARS-CoV-2 viral copies this assay can detect is 138 copies/mL. A negative result does not preclude SARS-Cov-2 infection and should not be used as the sole basis for treatment or other patient  management decisions. A negative result may occur with  improper specimen collection/handling, submission of specimen other than nasopharyngeal swab, presence of viral mutation(s) within the areas targeted by this assay, and inadequate number of viral copies(<138 copies/mL). A negative result must be combined with clinical observations, patient history, and epidemiological information. The expected result is Negative.  Fact Sheet for Patients:  EntrepreneurPulse.com.au  Fact Sheet for Healthcare Providers:  IncredibleEmployment.be  This test is no t yet approved or cleared by the [place] FDA and  has been authorized for detection and/or diagnosis of SARS-CoV-2 by FDA under an Emergency Use Authorization (EUA). This EUA will remain  in effect (meaning this test can be used) for the duration of the COVID-19 declaration under Section 564(b)(1) of the Act, 21 U.S.C.section 360bbb-3(b)(1), unless the authorization is terminated  or revoked sooner.       Influenza A by PCR POSITIVE (A) NEGATIVE Final   Influenza B by PCR NEGATIVE NEGATIVE Final    Comment: (NOTE) The Xpert Xpress SARS-CoV-2/FLU/RSV plus assay is intended as an aid in the diagnosis of influenza from Nasopharyngeal swab specimens and should not be used as a sole basis for treatment. Nasal washings and aspirates are unacceptable for Xpert Xpress SARS-CoV-2/FLU/RSV testing.  Fact Sheet for Patients: EntrepreneurPulse.com.au  Fact Sheet for Healthcare Providers: IncredibleEmployment.be  This test is not yet approved or cleared by the [place] FDA and has been authorized for detection and/or diagnosis of SARS-CoV-2 by FDA under an Emergency Use Authorization (EUA). This EUA will remain in effect (meaning this test can be used) for the duration of the COVID-19 declaration under Section 564(b)(1) of the Act, 21 U.S.C. section  360bbb-3(b)(1), unless the authorization is terminated or revoked.     Resp Syncytial Virus by PCR NEGATIVE NEGATIVE Final    Comment: (NOTE) Fact Sheet for Patients: EntrepreneurPulse.com.au  Fact Sheet for Healthcare Providers: IncredibleEmployment.be  This test is not yet approved or cleared by the [place] FDA and has been authorized for detection and/or diagnosis of SARS-CoV-2 by FDA under an Emergency Use Authorization (EUA). This EUA will remain in effect (meaning this test can be used) for the duration of the COVID-19 declaration under Section 564(b)(1) of the Act, 21 U.S.C. section 360bbb-3(b)(1), unless the authorization is terminated or revoked.  Performed at [facility], [place] [address] [place],  [address]      Radiology Studies: No results found.   Scheduled Meds:  amiodarone  200 mg Oral BID   apixaban  5 mg Oral  BID   furosemide  40 mg Oral Daily   insulin aspart  0-9 Units Subcutaneous TID WC   insulin glargine-yfgn  10 Units Subcutaneous BID   midodrine  10 mg Oral TID WC   Continuous Infusions:  sodium chloride       LOS: 9 days    Time spent:    Zannie Cove, MD [facility]   05/14/2022, 11:40 AM

## 2022-05-14 NOTE — Progress Notes (Signed)
   Heart Failure Stewardship Pharmacist Progress Note   PCP: Patient, No Pcp Per PCP-Cardiologist: None    HPI:  72 yo M with PMH of CAD s/p CABG in 2020, HTN, T2DM, and HLD.   He presented to the ED on 1/13 for CHF exacerbation, weakness, and shortness of breath. He was at [facility] for 3-4 days but was wanting to leave so he was discharged. Was in respiratory distress and hypotensive, CCM consulted and was admitted to the ICU. CXR with R pleural effusion and bibasilar atelectasis. Lactate 2.9 Antibiotics started for PNA. Lactate improved to 1.0. ECHO 1/15 showed LVEF 20-25%, global hypokinesis, mild LVH, RV moderately reduced, moderate MR and TR. Remains in afib RVR. S/p TEE/DCCV on 1/19.   Current HF Medications: Diuretic: furosemide 40 mg daily *also on midodrine 10 mg TID  Prior to admission HF Medications: Diuretic: furosemide 40 mg daily Beta blocker: metoprolol XL 50 mg daily ACE/ARB/ARNI: valsartan 80 mg daily MRA: spironolactone 25 mg daily SGLT2i: Farxiga 10 mg daily *carvedilol and HCTZ still on PTA med list (dc'd from [place] at discharge)  Pertinent Lab Values: Serum creatinine 1.43, BUN 19, Potassium 3.5, Sodium 141, BNP 1029.1, Magnesium 2.1, A1c 10.6   Vital Signs: Weight: 186 lbs (admission weight: 225 lbs) Blood pressure: 100/80s  Heart rate: 70-80s I/O: +0.2L yesterday; net -7.7L  Medication Assistance / Insurance Benefits Check: Does the patient have prescription insurance?  Yes Type of insurance plan: [place] Medicaid  Outpatient Pharmacy:  Prior to admission outpatient pharmacy: [facility] Is the patient willing to use [place] pharmacy at discharge? Yes Is the patient willing to transition their outpatient pharmacy to utilize a [facility] outpatient pharmacy?   Pending    Assessment: 1. Acute on chronic systolic CHF (LVEF 18-56%), due to ICM. NYHA class III symptoms. - Continue furosemide 40 mg daily. Strict I/Os and daily weights. Keep K>4  and Mg>2.  - GDMT limited by hypotension and AKI. Now off pressors. Trend BP and creatinine. May need to start BB as outpatient given cardiogenic/septic shock this admission. - On midodrine 10 mg TID   Plan: 1) Medication changes recommended at this time: - None  2) Patient assistance: Delene Loll copay $0 - Jardiance copay $0  3)  Education  - To be completed prior to discharge  Kerby Nora, PharmD, BCPS Heart Failure Stewardship Pharmacist Phone [phone]

## 2022-05-14 NOTE — Progress Notes (Signed)
   05/14/22 0917  Vitals  Temp 97.7 F (36.5 C)  Temp Source Oral  BP 109/80  MAP (mmHg) 90  BP Method Automatic  Patient Position (if appropriate) Lying  Pulse Rate 77  Pulse Rate Source Monitor  ECG Heart Rate 77  Resp 14  Level of Consciousness  Level of Consciousness Alert  MEWS COLOR  MEWS Score Color Green  Oxygen Therapy  SpO2 94 %  O2 Device Room Air  MEWS Score  MEWS Temp 0  MEWS Systolic 0  MEWS Pulse 0  MEWS RR 0  MEWS LOC 0  MEWS Score 0

## 2022-05-14 NOTE — Plan of Care (Signed)
  Problem: Fluid Volume: Goal: Ability to maintain a balanced intake and output will improve 05/14/2022 2240 by Tristan Schroeder, RN Outcome: Progressing 05/14/2022 2240 by Tristan Schroeder, RN Outcome: Progressing   Problem: Health Behavior/Discharge Planning: Goal: Ability to manage health-related needs will improve 05/14/2022 2240 by Tristan Schroeder, RN Outcome: Progressing 05/14/2022 2240 by Tristan Schroeder, RN Outcome: Progressing

## 2022-05-15 DIAGNOSIS — I5023 Acute on chronic systolic (congestive) heart failure: Secondary | ICD-10-CM | POA: Diagnosis not present

## 2022-05-15 LAB — GLUCOSE, CAPILLARY
Glucose-Capillary: 122 mg/dL — ABNORMAL HIGH (ref 70–99)
Glucose-Capillary: 160 mg/dL — ABNORMAL HIGH (ref 70–99)
Glucose-Capillary: 159 mg/dL — ABNORMAL HIGH (ref 70–99)
Glucose-Capillary: 213 mg/dL — ABNORMAL HIGH (ref 70–99)

## 2022-05-15 MED ORDER — MIDODRINE HCL 5 MG PO TABS
10.0000 mg | ORAL_TABLET | Freq: Two times a day (BID) | ORAL | Status: DC
  Administered 2022-05-15: 10 mg via ORAL
  Filled 2022-05-15 (×2): qty 2

## 2022-05-15 MED ORDER — MIDODRINE HCL 10 MG PO TABS: 10.0000 mg | ORAL_TABLET | Freq: Two times a day (BID) | ORAL | Status: DC

## 2022-05-15 MED ORDER — GABAPENTIN 100 MG PO CAPS: 300.0000 mg | ORAL_CAPSULE | Freq: Every day | ORAL | Status: AC

## 2022-05-15 MED ORDER — MIRTAZAPINE 15 MG PO TABS: 30.0000 mg | ORAL_TABLET | Freq: Every day | ORAL | Status: AC

## 2022-05-15 NOTE — Progress Notes (Signed)
   05/15/22 0801  Vitals  Temp 98.2 F (36.8 C)  Temp Source Oral  BP 128/87  MAP (mmHg) 99  BP Method Automatic  Pulse Rate 88  Pulse Rate Source Monitor  ECG Heart Rate 88  Resp 19  Level of Consciousness  Level of Consciousness Alert  MEWS COLOR  MEWS Score Color Green  Oxygen Therapy  SpO2 94 %  O2 Device Room Air  MEWS Score  MEWS Temp 0  MEWS Systolic 0  MEWS Pulse 0  MEWS RR 0  MEWS LOC 0  MEWS Score 0

## 2022-05-15 NOTE — Progress Notes (Signed)
Occupational Therapy Treatment Patient Details Name: Jerry Myers MRN: [id number] DOB: 06/08/1950 Today's Date: 05/15/2022   History of present illness Pt is a 72 y.o. M who presents 05/05/2022 for evaluation of acute on chronic systolic heart failure and influenza A. Significant PMH: CAD s/p CABG x 4, DM, HTN, HLD.   OT comments  Pt progressing towards goals this session, needing increased motivation/time to participate in session.  Pt needing min A for toilet transfer with RW, min cues needed for safety/hand placement when sitting/standing from commode. SpO2 in mid 90's throughout session on RA. Pt presenting with impairments listed below, will follow acutely. Continue to recommend SNF at d/c.   Recommendations for follow up therapy are one component of a multi-disciplinary discharge planning process, led by the attending physician.  Recommendations may be updated based on patient status, additional functional criteria and insurance authorization.    Follow Up Recommendations  Skilled nursing-short term rehab (<3 hours/day)     Assistance Recommended at Discharge Frequent or constant Supervision/Assistance  Patient can return home with the following  A little help with walking and/or transfers;A lot of help with bathing/dressing/bathroom;Assistance with cooking/housework;Assist for transportation;Help with stairs or ramp for entrance   Equipment Recommendations  BSC/3in1;Other (comment)    Recommendations for Other Services      Precautions / Restrictions Precautions Precautions: Fall Precaution Comments: monitor safety awareness Restrictions Weight Bearing Restrictions: No       Mobility Bed Mobility Overal bed mobility: Needs Assistance Bed Mobility: Supine to Sit, Sit to Supine     Supine to sit: Min assist          Transfers Overall transfer level: Needs assistance Equipment used: Rolling walker (2 wheels) Transfers: Sit to/from Stand Sit to Stand: Min  guard           General transfer comment: assist from low commode surface     Balance Overall balance assessment: Needs assistance Sitting-balance support: Feet supported Sitting balance-Leahy Scale: Fair Sitting balance - Comments: supervision static sitting   Standing balance support: Bilateral upper extremity supported, During functional activity Standing balance-Leahy Scale: Poor Standing balance comment: reliant on RW                           ADL either performed or assessed with clinical judgement   ADL Overall ADL's : Needs assistance/impaired                         Toilet Transfer: Rolling walker (2 wheels);Ambulation;Regular Toilet;Minimal assistance Toilet Transfer Details (indicate cue type and reason): cues for safety         Functional mobility during ADLs: Minimal assistance;Cueing for safety;Cueing for sequencing;Rolling walker (2 wheels)      Extremity/Trunk Assessment Upper Extremity Assessment Upper Extremity Assessment: Generalized weakness   Lower Extremity Assessment Lower Extremity Assessment: Generalized weakness        Vision   Vision Assessment?: No apparent visual deficits   Perception Perception Perception: Not tested   Praxis Praxis Praxis: Not tested    Cognition Arousal/Alertness: Awake/alert Behavior During Therapy: Flat affect Overall Cognitive Status: No family/caregiver present to determine baseline cognitive functioning                                 General Comments: follows commands appropriately, decreased awareness of safety/deficits        Exercises  Shoulder Instructions       General Comments SpO2 mid 90's on RA    Pertinent Vitals/ Pain       Pain Assessment Pain Assessment: No/denies pain  Home Living                                          Prior Functioning/Environment              Frequency  Min 2X/week        Progress  Toward Goals  OT Goals(current goals can now be found in the care plan section)  Progress towards OT goals: Progressing toward goals  Acute Rehab OT Goals Patient Stated Goal: none stated OT Goal Formulation: With patient Time For Goal Achievement: 05/20/22 Potential to Achieve Goals: Good ADL Goals Pt Will Perform Grooming: with modified independence;standing Pt Will Perform Lower Body Dressing: with modified independence;sit to/from stand Pt Will Transfer to Toilet: with modified independence;ambulating Additional ADL Goal #1: Pt will indep complete IADL medicaiton management task  Plan Discharge plan remains appropriate;Frequency remains appropriate    Co-evaluation                 AM-PAC OT "6 Clicks" Daily Activity     Outcome Measure   Help from another person eating meals?: A Little Help from another person taking care of personal grooming?: A Little Help from another person toileting, which includes using toliet, bedpan, or urinal?: A Little Help from another person bathing (including washing, rinsing, drying)?: A Lot Help from another person to put on and taking off regular upper body clothing?: A Little Help from another person to put on and taking off regular lower body clothing?: A Lot 6 Click Score: 16    End of Session Equipment Utilized During Treatment: Gait belt;Rolling walker (2 wheels)  OT Visit Diagnosis: Unsteadiness on feet (R26.81);Other abnormalities of gait and mobility (R26.89);Muscle weakness (generalized) (M62.81);History of falling (Z91.81)   Activity Tolerance Patient tolerated treatment well   Patient Left in chair;with call bell/phone within reach;with chair alarm set   Nurse Communication Mobility status        Time: 8469-6295 OT Time Calculation (min): 17 min  Charges: OT General Charges $OT Visit: 1 Visit OT Treatments $Self Care/Home Management : 8-22 mins  Jerry Myers, OTD, OTR/L SecureChat Preferred Acute Rehab ([phone])  [phone]   Jerry Myers 05/15/2022, 11:31 AM

## 2022-05-15 NOTE — TOC Progression Note (Signed)
Transition of Care [facility]) - Progression Note    Patient Details  Name: Jerry Myers MRN: [id number] Date of Birth: [birth year]  Transition of Care [facility]) CM/SW [place], LCSW Phone Number: 05/15/2022, 11:29 AM  Clinical Narrative:    CSW has started insurance auth for [facility]. TOC will continue to follow.         Expected Discharge Plan and Services                                               Social Determinants of Health (SDOH) Interventions SDOH Screenings   Food Insecurity: No Food Insecurity (05/06/2022)  Housing: Low Risk  (05/10/2022)  Transportation Needs: No Transportation Needs (05/10/2022)  Utilities: Not At Risk (05/10/2022)  Alcohol Screen: Low Risk  (05/10/2022)  Financial Resource Strain: Medium Risk (05/10/2022)  Tobacco Use: Low Risk  (05/13/2022)    Readmission Risk Interventions     No data to display         Beckey Rutter, MSW, LCSWA, LCASA Transitions of Care  Clinical Social Worker I

## 2022-05-15 NOTE — Progress Notes (Signed)
Received call from infection prevention that droplet precautions can be discontinued. MD made aware.

## 2022-05-15 NOTE — Progress Notes (Signed)
   05/15/22 1000  Mobility  Activity Ambulated with assistance in hallway  Level of Assistance Standby assist, set-up cues, supervision of patient - no hands on  Assistive Device Front wheel walker  Distance Ambulated (ft) 300 ft  Activity Response Tolerated well  Mobility Referral Yes  $Mobility charge 1 Mobility   Mobility Specialist Progress Note  Pt was in bed and agreeable. X2 standing breaks d/t fatigue. Left in bed w/ all needs met and alarm on.   Lucious Groves Mobility Specialist  Please contact via SecureChat or Rehab office at [phone]

## 2022-05-15 NOTE — Discharge Summary (Addendum)
Physician Discharge Summary  VASHAWN EKSTEIN AVW:[id number] DOB: 07-29-1950 DOA: 05/05/2022  PCP: Patient, No Pcp Per  Admit date: 05/05/2022 Discharge date: 05/16/2022  Time spent: 45 minutes  Recommendations for Outpatient Follow-up:  [facility] clinic follow-up arranged Cardiology Dr. Tomie China in 1 month Consider palliative care discussion if he declines further  Discharge Diagnoses:  Principal Problem:   Acute on chronic systolic (congestive) heart failure (HCC) Acute hypoxic respiratory failure CAD/CABG   Acute on chronic congestive heart failure (HCC)   Septic shock (HCC)   Influenzal pneumonia Paroxysmal atrial fibrillation AKI/CKD3a   Discharge Condition: Improved  Diet recommendation: Low-sodium, diabetic  Filed Weights   05/13/22 0600 05/14/22 0600 05/15/22 0312  Weight: 81 kg 80.3 kg 80.3 kg    History of present illness:  72/M with history of CAD/CABG 2020, chronic systolic CHF, type 2 diabetes mellitus, hypertension was recently hospitalized at [facility] with CHF and atrial flutter briefly diuresed and discharged home 1/13 per pt request.  Upon arrival home family noticed he was significantly dyspneic with minimal activity, EMS was called and he was brought to [facility], in the ER he was tachypneic, placed on BiPAP, febrile to 102, flu positive, blood pressure was low, he was started on pressors and admitted to ICU, troponin was 93 -Treated with Levophed and diuretics for combination of cardiogenic and septic shock -Weaned off pressors 1/17, started on midodrine -1/18 transferred to [facility] service -1/19 underwent TEE/cardioversion, declines rehab -1/20: Now agrees to rehab, [facility] consulted  Hospital Course:   Acute hypoxic respiratory failure -Secondary to CHF, flu pneumonia, improving, required BiPAP on admission -Weaned off O2 -PT OT eval completed, SNF recommended, now agreeable to short-term rehab   Shock-multifactorial, combination of septic and  cardiogenic -Treated with Levophed in ICU, -Influenza felt to be the source, along with cardiogenic component, blood cultures negative -Now on midodrine, completed course of Tamiflu -Cut down midodrine to 10 mg twice daily   Acute on chronic systolic CHF, BiV failure CAD/CABG -Echo with EF of 20-25%, moderately reduced RV, previously EF was 30-35% in 5/20, known ischemic cardiomyopathy -Now off Levophed, BP now stable on on midodrine -Diuresed with IV Lasix, he is 7.8 L negative, weight down 16lbs, -Now felt to be euvolemic,-GDMT limited by hypotension changed to oral Lasix 40 Mg daily -Cards following, underwent cardioversion 1/19, now in NSR -CHF TOC clinic follow-up arranged, titrate down midodrine as tolerated, add GDMT when blood pressure trends up further   Paroxysmal atrial fib/atrial flutter -Now on oral amiodarone and apixaban -Status post TEE/cardioversion 1/19 in sinus rhythm   AKI/CKD -Baseline creatinine unknown, was 1.4 in 2021, 1.7 on admission, now down to 1.42, likely his baseline   Influenza -Continue Tamiflu, completed course   Type 2 diabetes mellitus -Stable, Lantus and Januvia at discharge  Discharge Exam: Vitals:   05/15/22 0412 05/15/22 0801  BP: 116/88 128/87  Pulse: 78 88  Resp: 18 19  Temp: (!) 97.5 F (36.4 C) 98.2 F (36.8 C)  SpO2: 94% 94%   General exam: Chronically ill male appears older than stated age, AAOx3, cognitive deficits HEENT: No JVD CVS: S1-S2, regular rhythm Lungs: Decreased breath sounds the bases otherwise clear Abdomen: Soft, nontender, bowel sounds present  Extremities: No edema  Skin: No rashes Psychiatry: Flat affect    Discharge Instructions    Allergies as of 05/15/2022       Reactions   Trulicity [dulaglutide] Nausea And Vomiting        Medication List  STOP taking these medications    clopidogrel 75 MG tablet Commonly known as: PLAVIX   Farxiga 10 MG Tabs tablet Generic drug: dapagliflozin  propanediol   metoprolol succinate 50 MG 24 hr tablet Commonly known as: TOPROL-XL   spironolactone 25 MG tablet Commonly known as: ALDACTONE   valsartan 80 MG tablet Commonly known as: DIOVAN       TAKE these medications    amiodarone 200 MG tablet Commonly known as: PACERONE Take 200 mg by mouth 2 (two) times daily.   aspirin EC 81 MG tablet Take 81 mg by mouth daily.   atorvastatin 20 MG tablet Commonly known as: LIPITOR Take 20 mg by mouth daily.   Eliquis 5 MG Tabs tablet Generic drug: apixaban Take 5 mg by mouth 2 (two) times daily.   escitalopram 5 MG tablet Commonly known as: LEXAPRO Take 5 mg by mouth daily.   furosemide 40 MG tablet Commonly known as: LASIX Take 40 mg by mouth daily.   gabapentin 100 MG capsule Commonly known as: NEURONTIN Take 3 capsules (300 mg total) by mouth at bedtime. What changed:  medication strength when to take this   Lantus SoloStar 100 UNIT/ML Solostar Pen Generic drug: insulin glargine Inject 20 Units into the skin at bedtime.   midodrine 10 MG tablet Commonly known as: PROAMATINE Take 1 tablet (10 mg total) by mouth 2 (two) times daily with a meal.   mirtazapine 15 MG tablet Commonly known as: REMERON Take 2 tablets (30 mg total) by mouth at bedtime. What changed: medication strength   nitroGLYCERIN 0.4 MG SL tablet Commonly known as: NITROSTAT Place 1 tablet (0.4 mg total) under the tongue every 5 (five) minutes as needed for chest pain. Patient needs appointment for further refills. 1 st attempt   sitaGLIPtin 100 MG tablet Commonly known as: JANUVIA Take 100 mg by mouth daily.       Allergies  Allergen Reactions   Trulicity [Dulaglutide] Nausea And Vomiting    Follow-up Information     [facility]. Go to.   Specialty: Cardiology Why: monday, 1/29 @ noon for HV TOC clinic appt within [place]. BRING ALL MEDICATIONS WITH YOU--we will help  set up next weeks pill box. Basement garage parking code: 1606 at [employer], off [employer]. Contact information: [address] [id number] [facility] [phone]                 The results of significant diagnostics from this hospitalization (including imaging, microbiology, ancillary and laboratory) are listed below for reference.    Significant Diagnostic Studies: ECHO TEE  Result Date: 05/11/2022    TRANSESOPHOGEAL ECHO REPORT   Patient Name:   JORGELUIS GURGANUS Date of Exam: 05/11/2022 Medical Rec #:  [id number]          Height:       68.0 in Accession #:    [id number]         Weight:       186.5 lb Date of Birth:  March 22, 1951          BSA:          1.984 m Patient Age:    29 years           BP:           146/119 mmHg Patient Gender: M  HR:           107 bpm. Exam Location:  Inpatient Procedure: Transesophageal Echo, Cardiac Doppler and Color Doppler Indications:     I50.21 Acute systolic (congestive) heart failure  History:         Patient has prior history of Echocardiogram examinations.                  Cardiomyopathy and CHF, CAD, Prior CABG; Risk                  Factors:Hypertension and Dyslipidemia.  Sonographer:     Leta Jungling RDCS Referring Phys:  Arty Baumgartner Diagnosing Phys: Weston Brass MD PROCEDURE: TEE procedure time was 10 minutes. The transesophogeal probe was passed without difficulty through the esophogus of the patient. Imaged were obtained with the patient in a left lateral decubitus position. Sedation performed by different physician. The patient was monitored while under deep sedation. Anesthestetic sedation was provided intravenously by Anesthesiology: 80.37mg  of Propofol. Image quality was good. The patient developed no complications during the procedure. A successful direct current cardioversion was performed at 120 joules with 1 attempt.  IMPRESSIONS  1. Left ventricular ejection fraction, by  estimation, is 20%. The left ventricle has severely decreased function. The left ventricular internal cavity size was severely dilated.  2. Right ventricular systolic function is moderately reduced. The right ventricular size is not well visualized.  3. Left atrial size was moderately dilated. No left atrial/left atrial appendage thrombus was detected.  4. The mitral valve is degenerative. Mild mitral valve regurgitation with BP 95/46 mmHg.  5. The aortic valve is grossly normal. There is mild calcification of the aortic valve. Aortic valve regurgitation not assessed. FINDINGS  Left Ventricle: Left ventricular ejection fraction, by estimation, is 20%. The left ventricle has severely decreased function. The left ventricular internal cavity size was severely dilated. Right Ventricle: The right ventricular size is not well visualized. Right vetricular wall thickness was not assessed. Right ventricular systolic function is moderately reduced. Left Atrium: Left atrial size was moderately dilated. No left atrial/left atrial appendage thrombus was detected. Right Atrium: Right atrial size was normal in size. Pericardium: There is no evidence of pericardial effusion. Mitral Valve: The mitral valve is degenerative in appearance. Mild mitral valve regurgitation. Tricuspid Valve: The tricuspid valve is not assessed. Tricuspid valve regurgitation is not demonstrated. Aortic Valve: The aortic valve is grossly normal. There is mild calcification of the aortic valve. Aortic valve regurgitation not assessed. Pulmonic Valve: The pulmonic valve was not assessed. Pulmonic valve regurgitation is not visualized. Aorta: Aortic root could not be assessed. IAS/Shunts: The interatrial septum was not assessed. Weston Brass MD Electronically signed by Weston Brass MD Signature Date/Time: 05/11/2022/11:13:57 AM    Final    ECHOCARDIOGRAM COMPLETE  Result Date: 05/07/2022    ECHOCARDIOGRAM REPORT   Patient Name:   LORIS WINROW Date  of Exam: 05/07/2022 Medical Rec #:  [id number]        Height:       68.0 in Accession #:    [id number]       Weight:       194.7 lb Date of Birth:  May 28, 1950        BSA:          2.020 m Patient Age:    71 years         BP:           105/76 mmHg Patient Gender: M  HR:           108 bpm. Exam Location:  Inpatient Procedure: 2D Echo, Cardiac Doppler and Color Doppler Indications:    I50.21 Acute systolic (congestive) heart failure  History:        Patient has prior history of Echocardiogram examinations. CAD,                 Prior CABG, Arrythmias:Atrial Flutter; Risk Factors:Diabetes and                 Hypertension.  Sonographer:    Mike Gip Referring Phys: [id number] SUDHAM CHAND IMPRESSIONS  1. Left ventricular ejection fraction, by estimation, is 20 to 25%. The left ventricle has severely decreased function. The left ventricle demonstrates global hypokinesis. The left ventricular internal cavity size was moderately dilated. There is mild left ventricular hypertrophy. Left ventricular diastolic parameters are indeterminate.  2. Right ventricular systolic function is moderately reduced. The right ventricular size is moderately enlarged. There is mildly elevated pulmonary artery systolic pressure.  3. Left atrial size was moderately dilated.  4. The mitral valve is abnormal. Moderate mitral valve regurgitation. No evidence of mitral stenosis.  5. Tricuspid valve regurgitation is moderate.  6. The aortic valve is tricuspid. There is moderate calcification of the aortic valve. There is moderate thickening of the aortic valve. Aortic valve regurgitation is not visualized. Aortic valve sclerosis/calcification is present, without any evidence of aortic stenosis.  7. Aortic dilatation noted. There is moderate dilatation of the ascending aorta, measuring 42 mm.  8. The inferior vena cava is dilated in size with <50% respiratory variability, suggesting right atrial pressure of 15 mmHg. FINDINGS  Left  Ventricle: Left ventricular ejection fraction, by estimation, is 20 to 25%. The left ventricle has severely decreased function. The left ventricle demonstrates global hypokinesis. The left ventricular internal cavity size was moderately dilated. There is mild left ventricular hypertrophy. Left ventricular diastolic parameters are indeterminate. Right Ventricle: The right ventricular size is moderately enlarged. Right vetricular wall thickness was not assessed. Right ventricular systolic function is moderately reduced. There is mildly elevated pulmonary artery systolic pressure. The tricuspid regurgitant velocity is 2.41 m/s, and with an assumed right atrial pressure of 15 mmHg, the estimated right ventricular systolic pressure is 38.2 mmHg. Left Atrium: Left atrial size was moderately dilated. Right Atrium: Right atrial size was normal in size. Pericardium: There is no evidence of pericardial effusion. Mitral Valve: The mitral valve is abnormal. There is moderate thickening of the mitral valve leaflet(s). Moderate mitral valve regurgitation. No evidence of mitral valve stenosis. Tricuspid Valve: The tricuspid valve is normal in structure. Tricuspid valve regurgitation is moderate . No evidence of tricuspid stenosis. Aortic Valve: The aortic valve is tricuspid. There is moderate calcification of the aortic valve. There is moderate thickening of the aortic valve. Aortic valve regurgitation is not visualized. Aortic valve sclerosis/calcification is present, without any  evidence of aortic stenosis. Pulmonic Valve: The pulmonic valve was normal in structure. Pulmonic valve regurgitation is mild. No evidence of pulmonic stenosis. Aorta: Aortic dilatation noted. There is moderate dilatation of the ascending aorta, measuring 42 mm. Venous: The inferior vena cava is dilated in size with less than 50% respiratory variability, suggesting right atrial pressure of 15 mmHg. IAS/Shunts: No atrial level shunt detected by color  flow Doppler.  LEFT VENTRICLE PLAX 2D LVIDd:         6.10 cm      Diastology LVIDs:         5.30  cm      LV e' medial:    3.89 cm/s LV PW:         1.30 cm      LV E/e' medial:  30.6 LV IVS:        1.30 cm      LV e' lateral:   8.68 cm/s LVOT diam:     2.10 cm      LV E/e' lateral: 13.7 LV SV:         30 LV SV Index:   15 LVOT Area:     3.46 cm  LV Volumes (MOD) LV vol d, MOD A2C: 202.0 ml LV vol d, MOD A4C: 192.0 ml LV vol s, MOD A2C: 155.0 ml LV vol s, MOD A4C: 147.0 ml LV SV MOD A2C:     47.0 ml LV SV MOD A4C:     192.0 ml LV SV MOD BP:      52.3 ml RIGHT VENTRICLE            IVC RV Basal diam:  5.10 cm    IVC diam: 2.50 cm RV S prime:     6.83 cm/s TAPSE (M-mode): 1.1 cm LEFT ATRIUM              Index        RIGHT ATRIUM           Index LA diam:        4.30 cm  2.13 cm/m   RA Area:     29.70 cm LA Vol (A2C):   116.0 ml 57.41 ml/m  RA Volume:   99.60 ml  49.30 ml/m LA Vol (A4C):   97.7 ml  48.36 ml/m LA Biplane Vol: 108.0 ml 53.45 ml/m  AORTIC VALVE             PULMONIC VALVE LVOT Vmax:   67.10 cm/s  PR End Diast Vel: 10.24 msec LVOT Vmean:  43.000 cm/s LVOT VTI:    0.086 m  AORTA Ao Root diam: 3.70 cm Ao Asc diam:  4.20 cm MITRAL VALVE                  TRICUSPID VALVE MV Area (PHT): 4.26 cm       TR Peak grad:   23.2 mmHg MV Decel Time: 178 msec       TR Vmax:        241.00 cm/s MR Peak grad:    47.9 mmHg MR Mean grad:    30.0 mmHg    SHUNTS MR Vmax:         346.00 cm/s  Systemic VTI:  0.09 m MR Vmean:        252.0 cm/s   Systemic Diam: 2.10 cm MR PISA:         1.57 cm MR PISA Eff ROA: 15 mm MR PISA Radius:  0.50 cm MV E velocity: 119.00 cm/s Jenkins Rouge MD Electronically signed by Jenkins Rouge MD Signature Date/Time: 05/07/2022/2:32:22 PM    Final    DG Chest Portable 1 View  Result Date: 05/05/2022 CLINICAL DATA:  SOB hypotension EXAM: PORTABLE CHEST 1 VIEW COMPARISON:  January tenth and 13, 2024 FINDINGS: The cardiomediastinal silhouette is unchanged and enlarged in contour.Status post median  sternotomy and CABG. Tortuous thoracic aorta. Atherosclerotic calcifications. There is a moderate RIGHT pleural effusion, similar in comparison to prior. No pneumothorax. Persistent bibasilar homogeneous opacities. IMPRESSION: Similar appearance of moderate RIGHT pleural effusion and favored bibasilar atelectasis. Electronically Signed  By: Meda Klinefelter M.D.   On: 05/05/2022 17:33    Microbiology: Recent Results (from the past 240 hour(s))  Blood culture (routine x 2)     Status: None   Collection Time: 05/05/22  9:05 PM   Specimen: BLOOD  Result Value Ref Range Status   Specimen Description BLOOD RIGHT ANTECUBITAL  Final   Special Requests   Final    BOTTLES DRAWN AEROBIC AND ANAEROBIC Blood Culture adequate volume   Culture   Final    NO GROWTH 5 DAYS Performed at [facility], [address], [place], [place] [address]    Report Status 05/10/2022 FINAL  Final  Blood culture (routine x 2)     Status: None   Collection Time: 05/05/22  9:10 PM   Specimen: BLOOD LEFT WRIST  Result Value Ref Range Status   Specimen Description BLOOD LEFT WRIST  Final   Special Requests   Final    BOTTLES DRAWN AEROBIC AND ANAEROBIC Blood Culture adequate volume   Culture   Final    NO GROWTH 5 DAYS Performed at [facility], [address] [place], [place] [address]    Report Status 05/10/2022 FINAL  Final  MRSA Next Gen by PCR, Nasal     Status: None   Collection Time: 05/05/22 11:30 PM   Specimen: Anterior Nasal Swab  Result Value Ref Range Status   MRSA by PCR Next Gen NOT DETECTED NOT DETECTED Final    Comment: (NOTE) The GeneXpert MRSA Assay (FDA approved for NASAL specimens only), is one component of a comprehensive MRSA colonization surveillance program. It is not intended to diagnose MRSA infection nor to guide or monitor treatment for MRSA infections. Test performance is not FDA approved in patients less than 50 years old. Performed at [facility], [address], [place], [place] [address]   Resp panel by RT-PCR (RSV, Flu A&B, Covid) Anterior Nasal Swab     Status: Abnormal   Collection Time: 05/05/22 11:32 PM   Specimen: Anterior Nasal Swab  Result Value Ref Range Status   SARS Coronavirus 2 by RT PCR NEGATIVE NEGATIVE Final    Comment: (NOTE) SARS-CoV-2 target nucleic acids are NOT DETECTED.  The SARS-CoV-2 RNA is generally detectable in upper respiratory specimens during the acute phase of infection. The lowest concentration of SARS-CoV-2 viral copies this assay can detect is 138 copies/mL. A negative result does not preclude SARS-Cov-2 infection and should not be used as the sole basis for treatment or other patient management decisions. A negative result may occur with  improper specimen collection/handling, submission of specimen other than nasopharyngeal swab, presence of viral mutation(s) within the areas targeted by this assay, and inadequate number of viral copies(<138 copies/mL). A negative result must be combined with clinical observations, patient history, and epidemiological information. The expected result is Negative.  Fact Sheet for Patients:  BloggerCourse.com  Fact Sheet for Healthcare Providers:  SeriousBroker.it  This test is no t yet approved or cleared by the [place] FDA and  has been authorized for detection and/or diagnosis of SARS-CoV-2 by FDA under an Emergency Use Authorization (EUA). This EUA will remain  in effect (meaning this test can be used) for the duration of the COVID-19 declaration under Section 564(b)(1) of the Act, 21 U.S.C.section 360bbb-3(b)(1), unless the authorization is terminated  or revoked sooner.       Influenza A by PCR POSITIVE (A) NEGATIVE Final   Influenza B by PCR NEGATIVE  NEGATIVE Final    Comment: (NOTE) The Xpert Xpress SARS-CoV-2/FLU/RSV plus assay is intended as an aid in the diagnosis of influenza from  Nasopharyngeal swab specimens and should not be used as a sole basis for treatment. Nasal washings and aspirates are unacceptable for Xpert Xpress SARS-CoV-2/FLU/RSV testing.  Fact Sheet for Patients: BloggerCourse.com  Fact Sheet for Healthcare Providers: SeriousBroker.it  This test is not yet approved or cleared by the [place] FDA and has been authorized for detection and/or diagnosis of SARS-CoV-2 by FDA under an Emergency Use Authorization (EUA). This EUA will remain in effect (meaning this test can be used) for the duration of the COVID-19 declaration under Section 564(b)(1) of the Act, 21 U.S.C. section 360bbb-3(b)(1), unless the authorization is terminated or revoked.     Resp Syncytial Virus by PCR NEGATIVE NEGATIVE Final    Comment: (NOTE) Fact Sheet for Patients: BloggerCourse.com  Fact Sheet for Healthcare Providers: SeriousBroker.it  This test is not yet approved or cleared by the [place] FDA and has been authorized for detection and/or diagnosis of SARS-CoV-2 by FDA under an Emergency Use Authorization (EUA). This EUA will remain in effect (meaning this test can be used) for the duration of the COVID-19 declaration under Section 564(b)(1) of the Act, 21 U.S.C. section 360bbb-3(b)(1), unless the authorization is terminated or revoked.  Performed at [facility], [address] [place], [place] [address]      Labs: Basic Metabolic Panel: Recent Labs  Lab 05/10/22 0100 05/11/22 0114 05/12/22 0054 05/13/22 0052 05/14/22 0031  NA 137 137 136 138 141  K 3.2* 4.1 3.8 3.5 3.5  CL 99 103 104 107 110  CO2 26 24 23  20* 22  GLUCOSE 105* 168* 153* 121* 161*  BUN 26* 28* 26* 21 19  CREATININE 1.42* 1.43* 1.35* 1.32* 1.43*  CALCIUM 8.5* 8.4* 8.9 8.8* 8.9   Liver Function Tests: Recent Labs  Lab 05/11/22 0114  AST 60*  ALT 67*  ALKPHOS 114   BILITOT 0.9  PROT 6.1*  ALBUMIN 2.9*   No results for input(s): "LIPASE", "AMYLASE" in the last 168 hours. No results for input(s): "AMMONIA" in the last 168 hours. CBC: Recent Labs  Lab 05/11/22 0114  WBC 5.6  HGB 13.1  HCT 38.5*  MCV 87.3  PLT 248   Cardiac Enzymes: No results for input(s): "CKTOTAL", "CKMB", "CKMBINDEX", "TROPONINI" in the last 168 hours. BNP: BNP (last 3 results) Recent Labs    05/05/22 1635 05/06/22 1316  BNP 1,029.1* 955.5*    ProBNP (last 3 results) No results for input(s): "PROBNP" in the last 8760 hours.  CBG: Recent Labs  Lab 05/14/22 1139 05/14/22 1746 05/14/22 2116 05/15/22 0605 05/15/22 1103  GLUCAP 227* 187* 153* 122* 160*       Signed:  05/17/22 MD.  [facility] 05/15/2022, 11:10 AM

## 2022-05-15 NOTE — Progress Notes (Signed)
   Heart Failure Stewardship Pharmacist Progress Note   PCP: Patient, No Pcp Per PCP-Cardiologist: None    HPI:  72 yo M with PMH of CAD s/p CABG in 2020, HTN, T2DM, and HLD.   He presented to the ED on 1/13 for CHF exacerbation, weakness, and shortness of breath. He was at [facility] for 3-4 days but was wanting to leave so he was discharged. Was in respiratory distress and hypotensive, CCM consulted and was admitted to the ICU. CXR with R pleural effusion and bibasilar atelectasis. Lactate 2.9 Antibiotics started for PNA. Lactate improved to 1.0. ECHO 1/15 showed LVEF 20-25%, global hypokinesis, mild LVH, RV moderately reduced, moderate MR and TR. Remains in afib RVR. S/p TEE/DCCV on 1/19.   Discharge HF Medications: Diuretic: furosemide 40 mg daily midodrine 10 mg BID  Prior to admission HF Medications: Diuretic: furosemide 40 mg daily Beta blocker: metoprolol XL 50 mg daily ACE/ARB/ARNI: valsartan 80 mg daily MRA: spironolactone 25 mg daily SGLT2i: Farxiga 10 mg daily *carvedilol and HCTZ still on PTA med list (dc'd from [facility] at discharge)  Pertinent Lab Values: Serum creatinine 1.43, BUN 19, Potassium 3.5, Sodium 141, BNP 1029.1, Magnesium 2.1, A1c 10.6   Vital Signs: Weight: 177 lbs (admission weight: 225 lbs) Blood pressure: 120/80s  Heart rate: 70-80s I/O: +0.6L yesterday; net -7L  Medication Assistance / Insurance Benefits Check: Does the patient have prescription insurance?  Yes Type of insurance plan: [place] Medicaid  Outpatient Pharmacy:  Prior to admission outpatient pharmacy: [facility] Is the patient willing to use [facility] pharmacy at discharge? Yes Is the patient willing to transition their outpatient pharmacy to utilize a [facility] outpatient pharmacy?   Pending    Assessment: 1. Acute on chronic systolic CHF (LVEF 96-04%), due to ICM. NYHA class III symptoms. - Continue furosemide 40 mg daily. Strict I/Os and daily weights. Keep K>4 and Mg>2.   - GDMT limited by hypotension and AKI. Now off pressors. Trend BP and creatinine. May need to start BB as outpatient given cardiogenic/septic shock this admission. - Reduced to midodrine 10 mg BID   Plan: 1) Medication changes recommended at this time: - None  2) Patient assistance: - Entresto copay $0 - Jardiance copay $0  3)  Education  - Patient has been educated on current HF medications and potential additions to HF medication regimen - Patient verbalizes understanding that over the next few months, these medication doses may change and more medications may be added to optimize HF regimen - Patient has been educated on basic disease state pathophysiology and goals of therapy   Kerby Nora, PharmD, BCPS Heart Failure Stewardship Pharmacist Phone [phone]

## 2022-05-15 NOTE — Progress Notes (Signed)
Report given to [facility] at [facility] [phone]).

## 2022-05-15 NOTE — Progress Notes (Signed)
   05/15/22 1600  Mobility  Activity Ambulated with assistance in hallway  Level of Assistance Standby assist, set-up cues, supervision of patient - no hands on  Assistive Device Front wheel walker  Distance Ambulated (ft) 300 ft  Activity Response Tolerated well  Mobility Referral Yes  $Mobility charge 1 Mobility   Mobility Specialist Progress Note  Pt requesting to walk again. Had no c/o pain. Left in bed w/a ll needs met and call bell in reach.  Lucious Groves Mobility Specialist  Please contact via SecureChat or Rehab office at [phone]

## 2022-05-17 ENCOUNTER — Telehealth (HOSPITAL_COMMUNITY): Payer: Self-pay

## 2022-05-17 NOTE — Telephone Encounter (Signed)
Called to confirm Heart & Vascular Transitions of Care appointment at 05/21/22.   No answer and no voice mail activated.

## 2022-05-21 ENCOUNTER — Ambulatory Visit (HOSPITAL_COMMUNITY)
Admit: 2022-05-21 | Discharge: 2022-05-21 | Disposition: A | Discharge status: Home / Self Care | Payer: No Typology Code available for payment source | Attending: Adult Health | Admitting: Adult Health

## 2022-05-21 ENCOUNTER — Telehealth: Payer: Self-pay | Admitting: Cardiology

## 2022-05-21 VITALS — BP 138/88 | HR 79 | Wt 170.2 lb

## 2022-05-21 DIAGNOSIS — Z7901 Long term (current) use of anticoagulants: Secondary | ICD-10-CM | POA: Diagnosis not present

## 2022-05-21 DIAGNOSIS — Z7984 Long term (current) use of oral hypoglycemic drugs: Secondary | ICD-10-CM | POA: Diagnosis not present

## 2022-05-21 DIAGNOSIS — I081 Rheumatic disorders of both mitral and tricuspid valves: Secondary | ICD-10-CM | POA: Insufficient documentation

## 2022-05-21 DIAGNOSIS — I130 Hypertensive heart and chronic kidney disease with heart failure and stage 1 through stage 4 chronic kidney disease, or unspecified chronic kidney disease: Secondary | ICD-10-CM | POA: Insufficient documentation

## 2022-05-21 DIAGNOSIS — I5022 Chronic systolic (congestive) heart failure: Secondary | ICD-10-CM | POA: Diagnosis not present

## 2022-05-21 DIAGNOSIS — I2510 Atherosclerotic heart disease of native coronary artery without angina pectoris: Secondary | ICD-10-CM | POA: Insufficient documentation

## 2022-05-21 DIAGNOSIS — Z794 Long term (current) use of insulin: Secondary | ICD-10-CM | POA: Insufficient documentation

## 2022-05-21 DIAGNOSIS — Z5986 Financial insecurity: Secondary | ICD-10-CM | POA: Diagnosis not present

## 2022-05-21 DIAGNOSIS — Z79899 Other long term (current) drug therapy: Secondary | ICD-10-CM | POA: Diagnosis not present

## 2022-05-21 DIAGNOSIS — N1831 Chronic kidney disease, stage 3a: Secondary | ICD-10-CM | POA: Insufficient documentation

## 2022-05-21 DIAGNOSIS — E1122 Type 2 diabetes mellitus with diabetic chronic kidney disease: Secondary | ICD-10-CM | POA: Diagnosis not present

## 2022-05-21 DIAGNOSIS — I480 Paroxysmal atrial fibrillation: Secondary | ICD-10-CM | POA: Insufficient documentation

## 2022-05-21 DIAGNOSIS — Z951 Presence of aortocoronary bypass graft: Secondary | ICD-10-CM | POA: Diagnosis not present

## 2022-05-21 DIAGNOSIS — Z8249 Family history of ischemic heart disease and other diseases of the circulatory system: Secondary | ICD-10-CM | POA: Diagnosis not present

## 2022-05-21 LAB — BASIC METABOLIC PANEL
Anion gap: 9 (ref 5–15)
BUN: 13 mg/dL (ref 8–23)
CO2: 23 mmol/L (ref 22–32)
Calcium: 8.5 mg/dL — ABNORMAL LOW (ref 8.9–10.3)
Chloride: 109 mmol/L (ref 98–111)
Creatinine, Ser: 1.41 mg/dL — ABNORMAL HIGH (ref 0.61–1.24)
GFR, Estimated: 52 mL/min — ABNORMAL LOW (ref >60)
Glucose, Bld: 232 mg/dL — ABNORMAL HIGH (ref 70–99)
Potassium: 3.6 mmol/L (ref 3.5–5.1)
Sodium: 141 mmol/L (ref 135–145)

## 2022-05-21 MED ORDER — MIDODRINE HCL 10 MG PO TABS: 5.0000 mg | ORAL_TABLET | Freq: Two times a day (BID) | ORAL | 2 refills | Status: AC

## 2022-05-21 NOTE — Progress Notes (Signed)
  Heart and Vascular Care Navigation  05/21/2022  Jerry Myers 06-Jun-1950 [id number]  Reason for Referral: Patient seen in [facility] Patient is participating in a Managed Medicaid Plan:Yes  Engaged with patient face to face for initial visit for Heart and Vascular Care Coordination.                                                                                                   Assessment: Patient is a 72 yo male who lives alone in a mobile home. Patient was married for 35 years until his wife passed and has 3 adult children. 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 He states that he drives, goes to the grocery store and able to make simple meals. He has an aide "housekeeper" a few hours every morning to assist with needs and oversight of his medications. Patient is currently in SNF for rehab and looking forward to returning home.   Patient stated "I love my home".                               HRT/VAS Care Coordination     Patients Home Cardiology Office Heart Failure Clinic  [facility]   Outpatient Care Team Social Worker   Social Worker Name: Raquel Sarna, [place] [phone]   Living arrangements for the past 2 months Mobile Home   Lives with: Self   Patient Current Insurance Coverage Medicaid; Managed Medicare   Patient Has Concern With Paying Medical Bills No   Does Patient Have Prescription Coverage? Yes   Home Assistive Devices/Equipment Cane (specify quad or straight)   DME Agency [employer]   [place] ([place])   Current home services DME       Social History:                                                                             SDOH Screenings   Food Insecurity: No Food Insecurity (05/06/2022)  Housing: Low Risk  (05/10/2022)  Transportation Needs: No Transportation Needs (05/10/2022)  Utilities: Not At Risk (05/10/2022)  Alcohol Screen: Low Risk  (05/10/2022)  Financial Resource Strain: Medium Risk (05/10/2022)   Tobacco Use: Low Risk  (05/13/2022)    SDOH Interventions: Financial Resources:    N/a  Food Insecurity:  N/a   Housing Insecurity:   N/a  Transportation:    N/a    Follow-up plan:  Patient appears to have good support system in place through his medicaid plan and denies any concerns at this time. CSW available as needed. Raquel Sarna, [place], [place]

## 2022-05-21 NOTE — Telephone Encounter (Signed)
Patient would like to transfer from Dr. Geraldo Pitter to Dr. Agustin Cree. Please confirm transfer

## 2022-05-21 NOTE — Patient Instructions (Signed)
EKG done today.  Labs done today. We will contact you only if your labs are abnormal.  DECREASE Midodrine 5mg  (1/2 tablet) by mouth 2 times daily  No other medication changes were made. Please continue all current medications as prescribed.  Thank you for allowing [place] to provide your heart failure care after your recent hospitalization. Please follow-up with General Cardiology.

## 2022-05-21 NOTE — Progress Notes (Signed)
HEART & VASCULAR TRANSITION OF CARE CONSULT NOTE     Referring Physician: Dr Jomarie Longs Primary Care:  Dr Mayford Knife in [place].  Primary Cardiologist: Dr Tomie China  HPI: Referred to clinic by Dr Jomarie Longs  for heart failure consultation.   Jerry Myers is a 72 year old with a history of chronic HFrEF, CAD, CABG, PAF and CKD stage IIIa. He is illiterate.   Admitted to [facility] with heart failure exacerbation and aflutter. Diuresed with IV laisx. Discharged to home per patient request 05/05/22. Readmitted the same day with acute hypoxic respiratory failure. Placed on Bipap and fever. Hospital course complicated by cardiogenic/septic shock/flu. Placed on pressors and treated with Tamiflu and midodrine. Had TEE/cardioversion --> SR. Placed on  amio and eliquis. Discharged 05/16/22.   Overall feeling fine. Denies SOB/PND/Orthopnea. Working with therapy. Ambulates with a walker. Appetite ok. No fever or chills. He has not been weighing at SNF. Taking all medications. Currently at a rehab center. Plan to go home after discharge. He has had a Programmer, applications 7 days (CAPS) a week and she helps with medications. He can read and write very little.  Able to drive.    Cardiac Testing  Echo 05/07/22  1. Left ventricular ejection fraction, by estimation, is 20 to 25%. The  left ventricle has severely decreased function. The left ventricle  demonstrates global hypokinesis. The left ventricular internal cavity size  was moderately dilated. There is mild  left ventricular hypertrophy. Left ventricular diastolic parameters are  indeterminate.   2. Right ventricular systolic function is moderately reduced. The right  ventricular size is moderately enlarged. There is mildly elevated  pulmonary artery systolic pressure.   3. Left atrial size was moderately dilated.   4. The mitral valve is abnormal. Moderate mitral valve regurgitation. No  evidence of mitral stenosis.   5. Tricuspid valve regurgitation is  moderate.   6. The aortic valve is tricuspid. There is moderate calcification of the  aortic valve. There is moderate thickening of the aortic valve. Aortic  valve regurgitation is not visualized. Aortic valve  sclerosis/calcification is present, without any evidence  of aortic stenosis.   7. Aortic dilatation noted. T  Echo 2020  1. The left ventricle has moderate-severely reduced systolic function,  with an ejection fraction of 30-35%. Anterolateral, inferolateral,  inferior severe hypokinesis. The cavity size was mildly dilated. There is  moderately increased left ventricular  wall thickness. Left ventricular diastolic Doppler parameters are  consistent with impaired relaxation.   2. The right ventricle has normal systolic function. The cavity was  normal. There is no increase in right ventricular wall thickness.   3. Left atrial size was mildly dilated.   4. Right atrial size was mildly dilated.   5. Mitral valve regurgitation is mild to moderate by color flow Doppler.  No evidence of mitral valve stenosis.   6. The aortic valve is tricuspid. Mild calcification of the aortic valve.  No stenosis of the aortic valve.   2020 Cath  3-vessel coronary artery disease, including diffuse LAD disease up to 70% (DFR of distal LAD is 0.71; cut-point for hemodynamic significance is 0.89), calcified 90% mid LCx stenosis as well as chronic occlusions of several OM branches, and chronic total occlusion of mid RCA with bridging and left-to-right collaterals.   Review of Systems: [y] = yes, [ ]  = no   General: Weight gain [ ] ; Weight loss [ ] ; Anorexia [ ] ; Fatigue [ ] ; Fever [ ] ; Chills [ ] ;  Weakness [ ]   Cardiac: Chest pain/pressure [ ] ; Resting SOB [ ] ; Exertional SOB [ ] ; Orthopnea [ ] ; Pedal Edema [ ] ; Palpitations [ ] ; Syncope [ ] ; Presyncope [ ] ; Paroxysmal nocturnal dyspnea[ ]   Pulmonary: Cough [ ] ; Wheezing[ ] ; Hemoptysis[ ] ; Sputum [ ] ; Snoring [ ]   GI: Vomiting[ ] ; Dysphagia[ ] ; Melena[  ]; Hematochezia [ ] ; Heartburn[ ] ; Abdominal pain [ ] ; Constipation [ ] ; Diarrhea [ ] ; BRBPR [ ]   GU: Hematuria[ ] ; Dysuria [ ] ; Nocturia[ ]   Vascular: Pain in legs with walking [ ] ; Pain in feet with lying flat [ ] ; Non-healing sores [ ] ; Stroke [ ] ; TIA [ ] ; Slurred speech [ ] ;  Neuro: Headaches[ ] ; Vertigo[ ] ; Seizures[ ] ; Paresthesias[ ] ;Blurred vision [ ] ; Diplopia [ ] ; Vision changes [ ]   Ortho/Skin: Arthritis [ ] ; Joint pain [ Y]; Muscle pain [ ] ; Joint swelling [ ] ; Back Pain [Y ]; Rash [ ]   Psych: Depression[ ] ; Anxiety[ ]   Heme: Bleeding problems [ ] ; Clotting disorders [ ] ; Anemia [ ]   Endocrine: Diabetes [ Y]; Thyroid dysfunction[ ]    Past Medical History:  Diagnosis Date   Abnormal nuclear cardiac imaging test 06/03/2018   Anxiety    Arthritis    Atrial flutter by electrocardiogram (HCC) 09/09/2018   Bradycardia 03/18/2018   CAD (coronary artery disease) 01/26/2019   Cardiomyopathy (HCC) 04/19/2015   Ejection fraction 4045% in the fall of 2016   Cataract    right eye   Chest pain 06/05/2018   Coronary artery disease    Coronary artery disease of native artery of native heart with stable angina pectoris (HCC) 03/18/2015   Depression    Dyspnea    Essential hypertension 12/29/2014   GERD (gastroesophageal reflux disease)    History of kidney stones    20 yrs. 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     aspirin EC 81 MG tablet Take 81 mg by mouth daily.     atorvastatin (LIPITOR) 20 MG tablet Take 20 mg by mouth daily.     ELIQUIS 5 MG TABS tablet Take 5 mg by mouth 2 (two) times daily.     escitalopram (LEXAPRO) 5 MG tablet Take 5 mg by mouth daily.     furosemide (LASIX) 40  MG tablet Take 40 mg by mouth daily.     gabapentin (NEURONTIN) 100 MG capsule Take 3 capsules (300 mg total) by mouth at bedtime.     LANTUS SOLOSTAR 100 UNIT/ML Solostar Pen Inject 20 Units into the skin at bedtime.     midodrine (PROAMATINE) 10 MG tablet Take 1 tablet (10 mg total) by mouth 2 (two) times daily with a meal.     mirtazapine (REMERON) 15 MG tablet Take 2 tablets (30 mg total) by mouth at bedtime.     nitroGLYCERIN (NITROSTAT) 0.4 MG SL tablet Place 1 tablet (0.4 mg total) under the tongue every 5 (five) minutes as needed for chest pain. Patient needs appointment for further refills. 1 st attempt 25 tablet 0   sitaGLIPtin (JANUVIA) 100 MG tablet Take 100 mg by mouth daily.     No current facility-administered medications for this encounter.    Allergies  Allergen Reactions   Trulicity [Dulaglutide] Nausea And Vomiting      Social History   Socioeconomic History   Marital status: Widowed    Spouse name: Not on file   Number of children: 3   Years of education: Not on file   Highest education level: 8th grade  Occupational History   Occupation: retired    Comment: Biomedical scientist  Tobacco Use   Smoking status: Never   Smokeless tobacco: Never  Vaping Use   Vaping Use: Never used  Substance and Sexual Activity   Alcohol use: Never   Drug use: Not Currently   Sexual activity: Not on file  Other Topics Concern   Not on file  Social History Narrative   Not on file   Social Determinants of Health   Financial Resource Strain: Medium Risk (05/10/2022)   Overall Financial Resource Strain (CARDIA)    Difficulty of Paying Living Expenses: Somewhat hard  Food Insecurity: No Food Insecurity (05/06/2022)   Hunger Vital Sign    Worried About Running Out of Food in the Last Year: Never true    Ran Out of Food in the Last Year: Never true  Transportation Needs: No Transportation Needs (05/10/2022)   PRAPARE - Hydrologist (Medical): No    Lack  of Transportation (Non-Medical): No  Physical Activity: Not on file  Stress: Not on file  Social Connections: Not on file  Intimate Partner Violence: Not At Risk (05/06/2022)   Humiliation, Afraid, Rape, and Kick questionnaire    Fear of Current or Ex-Partner: No    Emotionally Abused: No    Physically Abused: No    Sexually Abused: No      Family History  Problem Relation Age of Onset   Diabetes Mother    Other Mother    Arrhythmia Mother    Diabetes Father    Hypertension Father    Diabetes Sister    Heart attack Brother    Arrhythmia Brother    Hypertension Brother    Congestive Heart Failure Brother    Kidney disease Brother    Diabetes Sister     Vitals:   05/21/22 1232  BP: 138/88  Pulse: 79  SpO2: 98%  Weight: 77.2 kg (170 lb 3.2 oz)   Wt Readings from Last 3 Encounters:  05/21/22 77.2 kg (170 lb 3.2 oz)  05/15/22 80.3 kg (177 lb 1.6 oz)  02/18/20 96.4 kg (212 lb 9.6 oz)    PHYSICAL EXAM: General:  Well appearing. No respiratory difficulty. Walked in with a walker HEENT: normal Neck: supple. no JVD. Carotids 2+ bilat; no bruits. No lymphadenopathy or thryomegaly appreciated. Cor: PMI nondisplaced. Regular rate & rhythm. No rubs, gallops or murmurs. Lungs: clear Abdomen: soft, nontender, nondistended. No hepatosplenomegaly. No bruits or masses. Good bowel sounds. Extremities: no cyanosis, clubbing, rash, edema Neuro: alert & oriented x 3, cranial nerves grossly intact. moves all 4 extremities w/o difficulty. Affect pleasant.  ECG: SR 74 bpm personally checked.    ASSESSMENT & PLAN: 1. Chronic HFrEF  Echo 04/2022 down to 20-25% from previous 30-35% in 2020. Repeat Echo 3-4 months.  NYHA II GDMT  Diuretic- Continue lasix 40 mg daily  - Cut back midodrine to 5 mg twice a day . GDMT limited with recent hypotension.  BB- Hold off for now.  Ace/ARB/ARNI- Hold off  MRA- Hold off SGLT2i- On januvia  2. PAF -In SR  - Cut back amio to 200 mg daily  -  Continue eliquis 5 mg twice a day   3. CAD -Had CABG 2020  -On statin+ eliquis.   Check BMET  Referred to HFSW (PCP, Medications, Transportation, ETOH Abuse, Drug Abuse, Insurance, Financial ): Yes - may need check in after d/c from SNF Refer to Pharmacy:  No Refer to Home Health:  No Refer to Advanced Heart Failure Clinic: no  Refer to General Cardiology: Back to [facility] in [place] per patient request.   Follow up at [facility] in [place] per patient request.   Maijor Hornig NP-C  1:51 PM

## 2022-05-28 ENCOUNTER — Telehealth: Payer: Self-pay | Admitting: Cardiology

## 2022-05-28 NOTE — Telephone Encounter (Signed)
New Message:    Jacques Earthly says she needs an order for a Hospice evaluation please.

## 2022-05-28 NOTE — Telephone Encounter (Signed)
Advised that the pt has not been seen in the office since 2021.

## 2022-10-11 NOTE — Progress Notes (Addendum)
Cardiology Office Note:    Date:  10/12/2022   ID:  [name], DOB November 16, 1950, MRN [id number]  PCP:  Patient, No Pcp Per   [facility] Providers Cardiologist:  None     Referring MD: No ref. provider found   CC: follow up HFrEF  History of Present Illness:    Jerry Myers is a 72 y.o. male with a hx of cardiomyopathy, HFrEF, hypertension, NSVT, CAD s/p CABG x 4 2020, GERD, DM 2, atrial flutter, bradycardia, CKD stage III A, illiterate.  05/11/2022 echo EF is 20%, left internal cavity severely dilated, RV systolic function moderately reduced, mild MR, mild calcification aortic valve. 09/03/2018 echo EF 30-35% 06/05/2018 LHC severe 3 vessel CAD > CABG 05/09/2018 echo EF 50-55%, grade I DD  Most recently admitted to [facility] health with heart failure exacerbation and atrial flutter, diuresed with Lasix, discharged home per his request on 05/05/2022 but was remitted the same day with acute hypoxic respiratory failure requiring BiPAP noted to be febrile.  Hospital course was complicated by cardiogenic shock/flu, noted to be in A-fib RVR had cardioversion/TEE and was converted to sinus rhythm.  Discharged on Eliquis and amiodarone.  He presents today for follow-up of his heart failure. He offers no formal complaints. He has an an aide that comes out several days a week provided by the state to help with filling his pill boxes etc. He denies chest pain, palpitations, dyspnea, pnd, orthopnea, n, v, dizziness, syncope, edema, weight gain, or early satiety.   Past Medical History:  Diagnosis Date   Abnormal nuclear cardiac imaging test 06/03/2018   Acute on chronic congestive heart failure (HCC) 05/06/2022   Acute on chronic systolic (congestive) heart failure (HCC) 05/05/2022   Anxiety    Arthritis    Atrial flutter by electrocardiogram (HCC) 09/09/2018   Bradycardia 03/18/2018   CAD (coronary artery disease) 01/26/2019   Cardiomyopathy (HCC) 04/19/2015   Ejection  fraction 4045% in the fall of 2016   Cataract    right eye   Chest pain 06/05/2018   Coronary artery disease    Coronary artery disease of native artery of native heart with stable angina pectoris (HCC) 03/18/2015   Depression    Dyspnea    Essential hypertension 12/29/2014   GERD (gastroesophageal reflux disease)    History of kidney stones    20 yrs. ago   LBBB (left bundle branch block) 10/12/2022   NSVT (nonsustained ventricular tachycardia) (HCC) 06/03/2018   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   Type 2 diabetes mellitus without complication (HCC) 12/29/2014   Ventricular extrasystoles 12/29/2014    Past Surgical History:  Procedure Laterality Date   CARDIAC CATHETERIZATION     CARDIOVERSION N/A 05/11/2022   Procedure: CARDIOVERSION;  Surgeon: Parke Poisson, MD;  Location: [facility] ENDOSCOPY;  Service: Cardiovascular;  Laterality: N/A;   CORONARY ARTERY BYPASS GRAFT N/A 09/05/2018   Procedure: CORONARY ARTERY BYPASS GRAFTING (CABG) x4, ON PUMP, USING LEFT INTERNAL MAMMARY ARTERY AND RIGHT AND LEFT GREAT SAPHENOUS VEIN HARVESTED ENDOSCOPICALLY;  Surgeon: Kerin Perna, MD;  Location: [facility] OR;  Service: Open Heart Surgery;  Laterality: N/A;   FOOT SURGERY     LEFT HEART CATH AND CORONARY ANGIOGRAPHY N/A 06/05/2018   Procedure: LEFT HEART CATH AND CORONARY ANGIOGRAPHY;  Surgeon: Yvonne Kendall, MD;  Location: [facility] INVASIVE CV LAB;  Service: Cardiovascular;  Laterality: N/A;   LUNG SURGERY     TEE WITHOUT CARDIOVERSION N/A 09/05/2018   Procedure: TRANSESOPHAGEAL ECHOCARDIOGRAM (TEE);  Surgeon: Donata Clay, Theron Arista, MD;  Location: [facility] OR;  Service: Open Heart Surgery;  Laterality: N/A;   TEE WITHOUT CARDIOVERSION N/A 05/11/2022   Procedure: TRANSESOPHAGEAL ECHOCARDIOGRAM (TEE);  Surgeon: Parke Poisson, MD;  Location: [facility] ENDOSCOPY;  Service: Cardiovascular;  Laterality: N/A;    Current Medications: Current Meds  Medication Sig   amiodarone (PACERONE) 200 MG tablet Take 200 mg by mouth 2 (two)  times daily.   aspirin EC 81 MG tablet Take 81 mg by mouth daily.   atorvastatin (LIPITOR) 20 MG tablet Take 20 mg by mouth daily.   ELIQUIS 5 MG TABS tablet Take 5 mg by mouth 2 (two) times daily.   escitalopram (LEXAPRO) 5 MG tablet Take 5 mg by mouth daily.   furosemide (LASIX) 40 MG tablet Take 40 mg by mouth daily.   gabapentin (NEURONTIN) 100 MG capsule Take 3 capsules (300 mg total) by mouth at bedtime.   LANTUS SOLOSTAR 100 UNIT/ML Solostar Pen Inject 20 Units into the skin at bedtime.   midodrine (PROAMATINE) 10 MG tablet Take 0.5 tablets (5 mg total) by mouth 2 (two) times daily with a meal.   mirtazapine (REMERON) 15 MG tablet Take 2 tablets (30 mg total) by mouth at bedtime.   nitroGLYCERIN (NITROSTAT) 0.4 MG SL tablet Place 1 tablet (0.4 mg total) under the tongue every 5 (five) minutes as needed for chest pain. Patient needs appointment for further refills. 1 st attempt   sacubitril-valsartan (ENTRESTO) 24-26 MG Take 1 tablet by mouth 2 (two) times daily.   sitaGLIPtin (JANUVIA) 100 MG tablet Take 100 mg by mouth daily.     Allergies:   Trulicity [dulaglutide]   Social History   Socioeconomic History   Marital status: Widowed    Spouse name: Not on file   Number of children: 3   Years of education: Not on file   Highest education level: 8th grade  Occupational History   Occupation: retired    Comment: Aeronautical engineer  Tobacco Use   Smoking status: Former    Types: Cigarettes   Smokeless tobacco: Never  Vaping Use   Vaping Use: Never used  Substance and Sexual Activity   Alcohol use: Never   Drug use: Not Currently   Sexual activity: Not on file  Other Topics Concern   Not on file  Social History Narrative   Not on file   Social Determinants of Health   Financial Resource Strain: Medium Risk (05/10/2022)   Overall Financial Resource Strain (CARDIA)    Difficulty of Paying Living Expenses: Somewhat hard  Food Insecurity: No Food Insecurity (05/06/2022)   Myers  Vital Sign    Worried About Running Out of Food in the Last Year: Never true    Ran Out of Food in the Last Year: Never true  Transportation Needs: No Transportation Needs (05/10/2022)   PRAPARE - Administrator, Civil Service (Medical): No    Lack of Transportation (Non-Medical): No  Physical Activity: Not on file  Stress: Not on file  Social Connections: Not on file     Family History: The patient's family history includes Arrhythmia in his brother and mother; Congestive Heart Failure in his brother; Diabetes in his father, mother, sister, and sister; Heart attack in his brother; Hypertension in his brother and father; Kidney disease in his brother; Other in his mother.  ROS:   Please see the history of present illness.     All other systems reviewed and are negative.  EKGs/Labs/Other Studies  Reviewed:    EKG today shows sinus bradycardia, HR 53 bpm.    The following studies were reviewed today: Cardiac Studies & Procedures   CARDIAC CATHETERIZATION  CARDIAC CATHETERIZATION 06/05/2018  Narrative Conclusions: 1. Significant three-vessel coronary artery disease, including diffuse LAD disease up to 70% (DFR of distal LAD is 0.71; cut-point for hemodynamic significance is 0.89), calcified 90% mid LCx stenosis as well as chronic occlusions of several OM branches, and chronic total occlusion of mid RCA with bridging and left-to-right collaterals. 2. Normal left ventricular filling pressure.  Recommendations: 1. Outpatient cardiac surgery consultation.  Though chest pain is atypical, he may not experience typical angina in the setting of diabetes mellitus.  He would benefit from CABG based on his coronary anatomy on history of diabetes mellitus. 2. Aggressive secondary prevention.  Yvonne Kendall, MD [facility] Pager: [phone]  Findings Coronary Findings Diagnostic  Dominance: Co-dominant  Left Main Vessel is large. Dist LM lesion is 25%  stenosed.  Left Anterior Descending The vessel is severely calcified. Ost LAD lesion is 20% stenosed. Prox LAD lesion is 50% stenosed. The lesion is calcified. Pressure wire/FFR was performed on the lesion. DFR 0.71. Mid LAD-1 lesion is 40% stenosed. DFR 0.71 Mid LAD-2 lesion is 70% stenosed. DFR 0.71.  First Diagonal Branch Vessel is small in size.  Second Diagonal Branch Vessel is moderate in size. Ost 2nd Diag to 2nd Diag lesion is 80% stenosed.  Left Circumflex Vessel is large. The vessel is moderately calcified. Mid Cx lesion is 90% stenosed. The lesion is severely calcified.  First Obtuse Marginal Branch Vessel is moderate in size. 1st Mrg lesion is 90% stenosed.  Second Obtuse Marginal Branch Vessel is large in size. Collaterals 2nd Mrg filled by collaterals from 1st Mrg.  2nd Mrg-1 lesion is 80% stenosed. The lesion is eccentric. 2nd Mrg-2 lesion is 100% stenosed. The lesion is chronically occluded with left-to-left collateral flow.  Third Obtuse Marginal Branch Vessel is moderate in size. Collaterals 3rd Mrg filled by collaterals from Dist LAD.  Ost 3rd Mrg lesion is 100% stenosed. The lesion is chronically occluded with left-to-left collateral flow.  Left Posterior Descending Artery Vessel is small in size. There is mild disease in the vessel.  Right Coronary Artery Vessel is moderate in size. Prox RCA lesion is 50% stenosed. Mid RCA lesion is 100% stenosed. The lesion is chronically occluded with bridging and left-to-right collateral flow.  Acute Marginal Branch Acute Mrg lesion is 70% stenosed.  Right Posterior Descending Artery Vessel is small in size.  Right Posterior Atrioventricular Artery Vessel is moderate in size.  First Right Posterolateral Branch Collaterals 1st RPL filled by collaterals from 2nd Sept.  Intervention  No interventions have been documented.   STRESS TESTS  MYOCARDIAL PERFUSION IMAGING 05/28/2018  Narrative  The  left ventricular ejection fraction is moderately decreased (30-44%).  Nuclear stress EF: 36%.  Blood pressure demonstrated a normal response to exercise.  There was no ST segment deviation noted during stress.  Defect 1: There is a medium defect of moderate severity present in the basal inferolateral, basal anterolateral, mid inferolateral, mid anterolateral, apical inferior and apical lateral location.  Findings consistent with ischemia and prior myocardial infarction.  This is a high risk study.  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 Diminished LVEF.   ECHOCARDIOGRAM  ECHOCARDIOGRAM COMPLETE 05/07/2022  Narrative ECHOCARDIOGRAM REPORT    Patient Name:   Jerry Myers Date of Exam: 05/07/2022 Medical  Rec #:  [id number]        Height:       68.0 in Accession #:    [id number]       Weight:       194.7 lb Date of Birth:  1951/03/22        BSA:          2.020 m Patient Age:    71 years         BP:           105/76 mmHg Patient Gender: M                HR:           108 bpm. Exam Location:  Inpatient  Procedure: 2D Echo, Cardiac Doppler and Color Doppler  Indications:    I50.21 Acute systolic (congestive) heart failure  History:        Patient has prior history of Echocardiogram examinations. CAD, Prior CABG, Arrythmias:Atrial Flutter; Risk Factors:Diabetes and Hypertension.  Sonographer:    Mike Gip Referring Phys: [id number] SUDHAM CHAND  IMPRESSIONS   1. Left ventricular ejection fraction, by estimation, is 20 to 25%. The left ventricle has severely decreased function. The left ventricle demonstrates global hypokinesis. The left ventricular internal cavity size was moderately dilated. There is mild left ventricular hypertrophy. Left ventricular diastolic parameters are indeterminate. 2. Right ventricular systolic function is moderately reduced. The right ventricular size is moderately enlarged. There  is mildly elevated pulmonary artery systolic pressure. 3. Left atrial size was moderately dilated. 4. The mitral valve is abnormal. Moderate mitral valve regurgitation. No evidence of mitral stenosis. 5. Tricuspid valve regurgitation is moderate. 6. The aortic valve is tricuspid. There is moderate calcification of the aortic valve. There is moderate thickening of the aortic valve. Aortic valve regurgitation is not visualized. Aortic valve sclerosis/calcification is present, without any evidence of aortic stenosis. 7. Aortic dilatation noted. There is moderate dilatation of the ascending aorta, measuring 42 mm. 8. The inferior vena cava is dilated in size with <50% respiratory variability, suggesting right atrial pressure of 15 mmHg.  FINDINGS Left Ventricle: Left ventricular ejection fraction, by estimation, is 20 to 25%. The left ventricle has severely decreased function. The left ventricle demonstrates global hypokinesis. The left ventricular internal cavity size was moderately dilated. There is mild left ventricular hypertrophy. Left ventricular diastolic parameters are indeterminate.  Right Ventricle: The right ventricular size is moderately enlarged. Right vetricular wall thickness was not assessed. Right ventricular systolic function is moderately reduced. There is mildly elevated pulmonary artery systolic pressure. The tricuspid regurgitant velocity is 2.41 m/s, and with an assumed right atrial pressure of 15 mmHg, the estimated right ventricular systolic pressure is 38.2 mmHg.  Left Atrium: Left atrial size was moderately dilated.  Right Atrium: Right atrial size was normal in size.  Pericardium: There is no evidence of pericardial effusion.  Mitral Valve: The mitral valve is abnormal. There is moderate thickening of the mitral valve leaflet(s). Moderate mitral valve regurgitation. No evidence of mitral valve stenosis.  Tricuspid Valve: The tricuspid valve is normal in structure.  Tricuspid valve regurgitation is moderate . No evidence of tricuspid stenosis.  Aortic Valve: The aortic valve is tricuspid. There is moderate calcification of the aortic valve. There is moderate thickening of the aortic valve. Aortic valve regurgitation is not visualized. Aortic valve sclerosis/calcification is present, without any evidence of aortic stenosis.  Pulmonic Valve: The pulmonic valve was normal in structure. Pulmonic valve regurgitation is mild. No  evidence of pulmonic stenosis.  Aorta: Aortic dilatation noted. There is moderate dilatation of the ascending aorta, measuring 42 mm.  Venous: The inferior vena cava is dilated in size with less than 50% respiratory variability, suggesting right atrial pressure of 15 mmHg.  IAS/Shunts: No atrial level shunt detected by color flow Doppler.   LEFT VENTRICLE PLAX 2D LVIDd:         6.10 cm      Diastology LVIDs:         5.30 cm      LV e' medial:    3.89 cm/s LV PW:         1.30 cm      LV E/e' medial:  30.6 LV IVS:        1.30 cm      LV e' lateral:   8.68 cm/s LVOT diam:     2.10 cm      LV E/e' lateral: 13.7 LV SV:         30 LV SV Index:   15 LVOT Area:     3.46 cm  LV Volumes (MOD) LV vol d, MOD A2C: 202.0 ml LV vol d, MOD A4C: 192.0 ml LV vol s, MOD A2C: 155.0 ml LV vol s, MOD A4C: 147.0 ml LV SV MOD A2C:     47.0 ml LV SV MOD A4C:     192.0 ml LV SV MOD BP:      52.3 ml  RIGHT VENTRICLE            IVC RV Basal diam:  5.10 cm    IVC diam: 2.50 cm RV S prime:     6.83 cm/s TAPSE (M-mode): 1.1 cm  LEFT ATRIUM              Index        RIGHT ATRIUM           Index LA diam:        4.30 cm  2.13 cm/m   RA Area:     29.70 cm LA Vol (A2C):   116.0 ml 57.41 ml/m  RA Volume:   99.60 ml  49.30 ml/m LA Vol (A4C):   97.7 ml  48.36 ml/m LA Biplane Vol: 108.0 ml 53.45 ml/m AORTIC VALVE             PULMONIC VALVE LVOT Vmax:   67.10 cm/s  PR End Diast Vel: 10.24 msec LVOT Vmean:  43.000 cm/s LVOT VTI:    0.086  m  AORTA Ao Root diam: 3.70 cm Ao Asc diam:  4.20 cm  MITRAL VALVE                  TRICUSPID VALVE MV Area (PHT): 4.26 cm       TR Peak grad:   23.2 mmHg MV Decel Time: 178 msec       TR Vmax:        241.00 cm/s MR Peak grad:    47.9 mmHg MR Mean grad:    30.0 mmHg    SHUNTS MR Vmax:         346.00 cm/s  Systemic VTI:  0.09 m MR Vmean:        252.0 cm/s   Systemic Diam: 2.10 cm MR PISA:         1.57 cm MR PISA Eff ROA: 15 mm MR PISA Radius:  0.50 cm MV E velocity: 119.00 cm/s  Charlton Haws MD Electronically signed by Charlton Haws MD  Signature Date/Time: 05/07/2022/2:32:22 PM    Final   TEE  ECHO TEE 05/11/2022  Narrative TRANSESOPHOGEAL ECHO REPORT    Patient Name:   Jerry Myers Date of Exam: 05/11/2022 Medical Rec #:  [id number]          Height:       68.0 in Accession #:    [id number]         Weight:       186.5 lb Date of Birth:  1950/12/10          BSA:          1.984 m Patient Age:    72 years           BP:           146/119 mmHg Patient Gender: M                  HR:           107 bpm. Exam Location:  Inpatient  Procedure: Transesophageal Echo, Cardiac Doppler and Color Doppler  Indications:     I50.21 Acute systolic (congestive) heart failure  History:         Patient has prior history of Echocardiogram examinations. Cardiomyopathy and CHF, CAD, Prior CABG; Risk Factors:Hypertension and Dyslipidemia.  Sonographer:     Leta Jungling RDCS Referring Phys:  Arty Baumgartner Diagnosing Phys: Weston Brass MD  PROCEDURE: TEE procedure time was 10 minutes. The transesophogeal probe was passed without difficulty through the esophogus of the patient. Imaged were obtained with the patient in a left lateral decubitus position. Sedation performed by different physician. The patient was monitored while under deep sedation. Anesthestetic sedation was provided intravenously by Anesthesiology: 80.37mg  of Propofol. Image quality was good. The patient developed  no complications during the procedure. A successful direct current cardioversion was performed at 120 joules with 1 attempt.  IMPRESSIONS   1. Left ventricular ejection fraction, by estimation, is 20%. The left ventricle has severely decreased function. The left ventricular internal cavity size was severely dilated. 2. Right ventricular systolic function is moderately reduced. The right ventricular size is not well visualized. 3. Left atrial size was moderately dilated. No left atrial/left atrial appendage thrombus was detected. 4. The mitral valve is degenerative. Mild mitral valve regurgitation with BP 95/46 mmHg. 5. The aortic valve is grossly normal. There is mild calcification of the aortic valve. Aortic valve regurgitation not assessed.  FINDINGS Left Ventricle: Left ventricular ejection fraction, by estimation, is 20%. The left ventricle has severely decreased function. The left ventricular internal cavity size was severely dilated.  Right Ventricle: The right ventricular size is not well visualized. Right vetricular wall thickness was not assessed. Right ventricular systolic function is moderately reduced.  Left Atrium: Left atrial size was moderately dilated. No left atrial/left atrial appendage thrombus was detected.  Right Atrium: Right atrial size was normal in size.  Pericardium: There is no evidence of pericardial effusion.  Mitral Valve: The mitral valve is degenerative in appearance. Mild mitral valve regurgitation.  Tricuspid Valve: The tricuspid valve is not assessed. Tricuspid valve regurgitation is not demonstrated.  Aortic Valve: The aortic valve is grossly normal. There is mild calcification of the aortic valve. Aortic valve regurgitation not assessed.  Pulmonic Valve: The pulmonic valve was not assessed. Pulmonic valve regurgitation is not visualized.  Aorta: Aortic root could not be assessed.  IAS/Shunts: The interatrial septum was not assessed.  Weston Brass MD Electronically signed by Weston Brass MD Signature  Date/Time: 05/11/2022/11:13:57 AM    Final                 Recent Labs: 05/06/2022: B Natriuretic Peptide 955.5 05/07/2022: Magnesium 2.1 05/11/2022: ALT 67; Hemoglobin 13.1; Platelets 248 05/21/2022: BUN 13; Creatinine, Ser 1.41; Potassium 3.6; Sodium 141  Recent Lipid Panel    Component Value Date/Time   CHOL 142 12/31/2019 1105   TRIG 180 (H) 12/31/2019 1105   HDL 39 (L) 12/31/2019 1105   CHOLHDL 3.6 12/31/2019 1105   CHOLHDL 2.9 06/20/2018 1215   LDLCALC 73 12/31/2019 1105   LDLCALC 60 06/20/2018 1215     Risk Assessment/Calculations:    CHA2DS2-VASc Score = 5   This indicates a 7.2% annual risk of stroke. The patient's score is based upon: CHF History: 1 HTN History: 1 Diabetes History: 1 Stroke History: 0 Vascular Disease History: 1 Age Score: 1 Gender Score: 0     HYPERTENSION CONTROL Vitals:   10/12/22 0813 10/12/22 1025  BP: (!) 180/90 (!) 170/78    The patient's blood pressure is elevated above target today.  In order to address the patient's elevated BP: A new medication was prescribed today.            Physical Exam:    VS:  BP (!) 170/78   Pulse (!) 53   Ht 5\' 8"  (1.727 m)   Wt 198 lb (89.8 kg)   SpO2 98%   BMI 30.11 kg/m     Wt Readings from Last 3 Encounters:  10/12/22 198 lb (89.8 kg)  05/21/22 170 lb 3.2 oz (77.2 kg)  05/15/22 177 lb 1.6 oz (80.3 kg)     GEN:  Well nourished, well developed in no acute distress HEENT: Normal NECK: No JVD; No carotid bruits LYMPHATICS: No lymphadenopathy CARDIAC: RRR, no murmurs, rubs, gallops RESPIRATORY:  Clear to auscultation without rales, wheezing or rhonchi  ABDOMEN: Soft, non-tender, non-distended MUSCULOSKELETAL:  No edema; No deformity  SKIN: Warm and dry NEUROLOGIC:  Alert and oriented x 3 PSYCHIATRIC:  Normal affect   ASSESSMENT:    1. Coronary artery disease involving native coronary artery of native heart  without angina pectoris   2. Chronic systolic heart failure (HCC)   3. PAF (paroxysmal atrial fibrillation) (HCC)   4. Essential hypertension    PLAN:    In order of problems listed above:  CAD-s/p CABG x 4 in 2020, Stable with no anginal symptoms. No indication for ischemic evaluation.  Continue aspirin 81 mg daily, not on a beta-blocker secondary to bradycardia.  HFrEF - NYHA class 1, euvolemic. Will start Entresto 24/26 mg BID, BP in office 180/90 > 170/78. BB - no d/t bradycardia, MRA - consider at future visit, SGLT2i - consider at future visit.  Hypertension-blood pressure is very elevated in the office today at 170/78, will start him on Entresto 24/26 milligrams twice daily and stop midodrine. Paroxysmal atrial fibrillation/hypercoagulable state/on amiodarone therapy-CHA2DS2-VASc score 5, continue Eliquis 5 mg twice daily--no indication for dose reduction, continue amiodarone 200 mg twice daily. Will repeat CBC, BMET today.  Disposition - CBC, BMET, start Entresto 24/26 BID, stop Midodrine. Return in 2 weeks for repeat BMET. Return in 4 weeks. Need TSH, LFT at next visit.      **addendum, pt is an active hospice patient with [facility]. We started him on Entresto, however not sure if this in on their formulary for hospice. Discussed with Dr. Josiah Lobo, decreased him amiodarone to 200 mg daily x 7 days, then 100 mg  daily thereafter. We faxed these recommendations to [facility] as they fill his medications for him.        Medication Adjustments/Labs and Tests Ordered: Current medicines are reviewed at length with the patient today.  Concerns regarding medicines are outlined above.  Orders Placed This Encounter  Procedures   Basic Metabolic Panel (BMET)   EKG 12-Lead   Meds ordered this encounter  Medications   sacubitril-valsartan (ENTRESTO) 24-26 MG    Sig: Take 1 tablet by mouth 2 (two) times daily.    Dispense:  28 tablet    Refill:  0    Order Specific Question:    Lot Number?    Answer:   ND [id number]    Order Specific Question:   Expiration Date?    Answer:   07/22/2024    Patient Instructions  Medication Instructions:  Your physician has recommended you make the following change in your medication:   START: Entresto 24-26 1 tablet twice daily  *If you need a refill on your cardiac medications before your next appointment, please call your pharmacy*   Lab Work: Your physician recommends that you return for lab work in:   Labs today: BMP, CBC Labs in 2 weeks: BMP  If you have labs (blood work) drawn today and your tests are completely normal, you will receive your results only by: MyChart Message (if you have MyChart) OR A paper copy in the mail If you have any lab test that is abnormal or we need to change your treatment, we will call you to review the results.   Testing/Procedures: None   Follow-Up: At [facility], you and your health needs are our priority.  As part of our continuing mission to provide you with exceptional heart care, we have created designated Provider Care Teams.  These Care Teams include your primary Cardiologist (physician) and Advanced Practice Providers (APPs -  Physician Assistants and Nurse Practitioners) who all work together to provide you with the care you need, when you need it.  We recommend signing up for the patient portal called "MyChart".  Sign up information is provided on this After Visit Summary.  MyChart is used to connect with patients for Virtual Visits (Telemedicine).  Patients are able to view lab/test results, encounter notes, upcoming appointments, etc.  Non-urgent messages can be sent to your provider as well.   To learn more about what you can do with MyChart, go to [email or web address].    Your next appointment:   4 week(s)  Provider:   Wallis Bamberg, NP [facility])    Other Instructions Tylenol 650 mg (2 tablets) every 6 hours.    Signed, Flossie Dibble, NP  10/12/2022  10:25 AM    [facility]

## 2022-10-12 ENCOUNTER — Encounter: Payer: Self-pay | Admitting: Cardiology

## 2022-10-12 ENCOUNTER — Ambulatory Visit: Payer: 59 | Attending: Cardiology | Admitting: Cardiology

## 2022-10-12 ENCOUNTER — Other Ambulatory Visit: Payer: Self-pay

## 2022-10-12 ENCOUNTER — Telehealth: Payer: Self-pay | Admitting: Cardiology

## 2022-10-12 VITALS — BP 170/78 | HR 53 | Ht 68.0 in | Wt 198.0 lb

## 2022-10-12 DIAGNOSIS — I480 Paroxysmal atrial fibrillation: Secondary | ICD-10-CM | POA: Diagnosis not present

## 2022-10-12 DIAGNOSIS — I447 Left bundle-branch block, unspecified: Secondary | ICD-10-CM

## 2022-10-12 DIAGNOSIS — I509 Heart failure, unspecified: Secondary | ICD-10-CM | POA: Insufficient documentation

## 2022-10-12 DIAGNOSIS — I119 Hypertensive heart disease without heart failure: Secondary | ICD-10-CM | POA: Insufficient documentation

## 2022-10-12 DIAGNOSIS — R739 Hyperglycemia, unspecified: Secondary | ICD-10-CM | POA: Insufficient documentation

## 2022-10-12 DIAGNOSIS — E785 Hyperlipidemia, unspecified: Secondary | ICD-10-CM | POA: Insufficient documentation

## 2022-10-12 DIAGNOSIS — I10 Essential (primary) hypertension: Secondary | ICD-10-CM | POA: Diagnosis not present

## 2022-10-12 DIAGNOSIS — Z79899 Other long term (current) drug therapy: Secondary | ICD-10-CM

## 2022-10-12 DIAGNOSIS — E119 Type 2 diabetes mellitus without complications: Secondary | ICD-10-CM | POA: Insufficient documentation

## 2022-10-12 DIAGNOSIS — J90 Pleural effusion, not elsewhere classified: Secondary | ICD-10-CM | POA: Insufficient documentation

## 2022-10-12 DIAGNOSIS — I2510 Atherosclerotic heart disease of native coronary artery without angina pectoris: Secondary | ICD-10-CM

## 2022-10-12 DIAGNOSIS — E860 Dehydration: Secondary | ICD-10-CM | POA: Insufficient documentation

## 2022-10-12 DIAGNOSIS — I5022 Chronic systolic (congestive) heart failure: Secondary | ICD-10-CM | POA: Diagnosis not present

## 2022-10-12 DIAGNOSIS — I340 Nonrheumatic mitral (valve) insufficiency: Secondary | ICD-10-CM | POA: Insufficient documentation

## 2022-10-12 DIAGNOSIS — D6859 Other primary thrombophilia: Secondary | ICD-10-CM

## 2022-10-12 HISTORY — DX: Left bundle-branch block, unspecified: I44.7

## 2022-10-12 MED ORDER — ENTRESTO 24-26 MG PO TABS: 1.0000 | ORAL_TABLET | Freq: Two times a day (BID) | ORAL | 3 refills | Status: AC

## 2022-10-12 MED ORDER — ENTRESTO 24-26 MG PO TABS: 1.0000 | ORAL_TABLET | Freq: Two times a day (BID) | ORAL | 0 refills | Status: AC

## 2022-10-12 NOTE — Patient Instructions (Signed)
Medication Instructions:  Your physician has recommended you make the following change in your medication:   START: Entresto 24-26 1 tablet twice daily  *If you need a refill on your cardiac medications before your next appointment, please call your pharmacy*   Lab Work: Your physician recommends that you return for lab work in:   Labs today: BMP, CBC Labs in 2 weeks: BMP  If you have labs (blood work) drawn today and your tests are completely normal, you will receive your results only by: MyChart Message (if you have MyChart) OR A paper copy in the mail If you have any lab test that is abnormal or we need to change your treatment, we will call you to review the results.   Testing/Procedures: None   Follow-Up: At [facility], you and your health needs are our priority.  As part of our continuing mission to provide you with exceptional heart care, we have created designated Provider Care Teams.  These Care Teams include your primary Cardiologist (physician) and Advanced Practice Providers (APPs -  Physician Assistants and Nurse Practitioners) who all work together to provide you with the care you need, when you need it.  We recommend signing up for the patient portal called "MyChart".  Sign up information is provided on this After Visit Summary.  MyChart is used to connect with patients for Virtual Visits (Telemedicine).  Patients are able to view lab/test results, encounter notes, upcoming appointments, etc.  Non-urgent messages can be sent to your provider as well.   To learn more about what you can do with MyChart, go to [email or web address].    Your next appointment:   4 week(s)  Provider:   Wallis Bamberg, NP [facility])    Other Instructions Tylenol 650 mg (2 tablets) every 6 hours.

## 2022-10-12 NOTE — Telephone Encounter (Signed)
Called the pt and advised to stop Midodrine. Pt states Andrey Campanile comes in and fixes my pill box for me. Pt does not have a number for [place]. Pt states that she works for [employer] but I spoke with [facility] and he is not an active pt with them. Left a message for [name] (sister), Charles(brother) and Elijah Birk (brother).

## 2022-10-13 LAB — CBC
Hematocrit: 42.9 % (ref 37.5–51.0)
Hemoglobin: 13.9 g/dL (ref 13.0–17.7)
MCH: 28.8 pg (ref 26.6–33.0)
MCHC: 32.4 g/dL (ref 31.5–35.7)
MCV: 89 fL (ref 79–97)
Platelets: 248 x10E3/uL (ref 150–450)
RBC: 4.82 x10E6/uL (ref 4.14–5.80)
RDW: 14.5 % (ref 11.6–15.4)
WBC: 6.8 x10E3/uL (ref 3.4–10.8)

## 2022-10-13 LAB — BASIC METABOLIC PANEL WITH GFR
BUN/Creatinine Ratio: 17 (ref 10–24)
BUN: 22 mg/dL (ref 8–27)
CO2: 22 mmol/L (ref 20–29)
Calcium: 9.1 mg/dL (ref 8.6–10.2)
Chloride: 107 mmol/L — ABNORMAL HIGH (ref 96–106)
Creatinine, Ser: 1.32 mg/dL — ABNORMAL HIGH (ref 0.76–1.27)
Glucose: 141 mg/dL — ABNORMAL HIGH (ref 70–99)
Potassium: 4.2 mmol/L (ref 3.5–5.2)
Sodium: 143 mmol/L (ref 134–144)
eGFR: 57 mL/min/1.73 — ABNORMAL LOW

## 2022-10-15 ENCOUNTER — Telehealth: Payer: Self-pay

## 2022-10-15 NOTE — Telephone Encounter (Signed)
Spoke with Jacki Cones at [facility]. She verified that the pt is a pt there and Arline Asp sees him to fill his pill box. Will fax orders per Wallis Bamberg, NP to [phone].

## 2022-11-08 NOTE — Progress Notes (Deleted)
Cardiology Office Note:    Date:  11/08/2022   ID:  Jerry Myers, DOB 14-Sep-1950, MRN [id number]  PCP:  Patient, No Pcp Per   [facility] Providers Cardiologist:  Garwin Brothers, MD     Referring MD: No ref. provider found   CC: follow up HFrEF  History of Present Illness:    Jerry Myers is a 72 y.o. male with a hx of cardiomyopathy, HFrEF, hypertension, NSVT, CAD s/p CABG x 4 2020, GERD, DM 2, atrial flutter, bradycardia, CKD stage III A, illiterate.  05/11/2022 echo EF is 20%, left internal cavity severely dilated, RV systolic function moderately reduced, mild MR, mild calcification aortic valve. 09/03/2018 echo EF 30-35% 06/05/2018 LHC severe 3 vessel CAD > CABG 05/09/2018 echo EF 50-55%, grade I DD  Most recently admitted to [facility] health with heart failure exacerbation and atrial flutter, diuresed with Lasix, discharged home per his request on 05/05/2022 but was remitted the same day with acute hypoxic respiratory failure requiring BiPAP noted to be febrile.  Hospital course was complicated by cardiogenic shock/flu, noted to be in A-fib RVR had cardioversion/TEE and was converted to sinus rhythm.  Discharged on Eliquis and amiodarone.  He presents today for follow-up of his heart failure. He offers no formal complaints. He has an an aide that comes out several days a week provided by the state to help with filling his pill boxes etc. He denies chest pain, palpitations, dyspnea, pnd, orthopnea, n, v, dizziness, syncope, edema, weight gain, or early satiety.   Past Medical History:  Diagnosis Date   Abnormal nuclear cardiac imaging test 06/03/2018   Acute on chronic congestive heart failure (HCC) 05/06/2022   Acute on chronic systolic (congestive) heart failure (HCC) 05/05/2022   Anxiety    Arthritis    Atrial flutter by electrocardiogram (HCC) 09/09/2018   Bradycardia 03/18/2018   CAD (coronary artery disease) 01/26/2019   Cardiomyopathy (HCC) 04/19/2015    Ejection fraction 4045% in the fall of 2016   Cataract    right eye   Chest pain 06/05/2018   Coronary artery disease    Coronary artery disease of native artery of native heart with stable angina pectoris (HCC) 03/18/2015   Depression    Dyspnea    Essential hypertension 12/29/2014   GERD (gastroesophageal reflux disease)    History of kidney stones    20 yrs. ago   LBBB (left bundle branch block) 10/12/2022   NSVT (nonsustained ventricular tachycardia) (HCC) 06/03/2018   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   Type 2 diabetes mellitus without complication (HCC) 12/29/2014   Ventricular extrasystoles 12/29/2014    Past Surgical History:  Procedure Laterality Date   CARDIAC CATHETERIZATION     CARDIOVERSION N/A 05/11/2022   Procedure: CARDIOVERSION;  Surgeon: Parke Poisson, MD;  Location: [facility] ENDOSCOPY;  Service: Cardiovascular;  Laterality: N/A;   CORONARY ARTERY BYPASS GRAFT N/A 09/05/2018   Procedure: CORONARY ARTERY BYPASS GRAFTING (CABG) x4, ON PUMP, USING LEFT INTERNAL MAMMARY ARTERY AND RIGHT AND LEFT GREAT SAPHENOUS VEIN HARVESTED ENDOSCOPICALLY;  Surgeon: Kerin Perna, MD;  Location: [facility] OR;  Service: Open Heart Surgery;  Laterality: N/A;   FOOT SURGERY     LEFT HEART CATH AND CORONARY ANGIOGRAPHY N/A 06/05/2018   Procedure: LEFT HEART CATH AND CORONARY ANGIOGRAPHY;  Surgeon: Yvonne Kendall, MD;  Location: [facility] INVASIVE CV LAB;  Service: Cardiovascular;  Laterality: N/A;   LUNG SURGERY     TEE WITHOUT CARDIOVERSION N/A 09/05/2018   Procedure: TRANSESOPHAGEAL  ECHOCARDIOGRAM (TEE);  Surgeon: Donata Clay, Theron Arista, MD;  Location: [facility] OR;  Service: Open Heart Surgery;  Laterality: N/A;   TEE WITHOUT CARDIOVERSION N/A 05/11/2022   Procedure: TRANSESOPHAGEAL ECHOCARDIOGRAM (TEE);  Surgeon: Parke Poisson, MD;  Location: [facility] ENDOSCOPY;  Service: Cardiovascular;  Laterality: N/A;    Current Medications: No outpatient medications have been marked as taking for the 11/09/22 encounter  (Appointment) with Flossie Dibble, NP.     Allergies:   Trulicity [dulaglutide]   Social History   Socioeconomic History   Marital status: Widowed    Spouse name: Not on file   Number of children: 3   Years of education: Not on file   Highest education level: 8th grade  Occupational History   Occupation: retired    Comment: Aeronautical engineer  Tobacco Use   Smoking status: Former    Types: Cigarettes   Smokeless tobacco: Never  Vaping Use   Vaping status: Never Used  Substance and Sexual Activity   Alcohol use: Never   Drug use: Not Currently   Sexual activity: Not on file  Other Topics Concern   Not on file  Social History Narrative   Not on file   Social Determinants of Health   Financial Resource Strain: Medium Risk (05/10/2022)   Overall Financial Resource Strain (CARDIA)    Difficulty of Paying Living Expenses: Somewhat hard  Food Insecurity: No Food Insecurity (05/06/2022)   Hunger Vital Sign    Worried About Running Out of Food in the Last Year: Never true    Ran Out of Food in the Last Year: Never true  Transportation Needs: No Transportation Needs (05/10/2022)   PRAPARE - Administrator, Civil Service (Medical): No    Lack of Transportation (Non-Medical): No  Physical Activity: Not on file  Stress: Not on file  Social Connections: Not on file     Family History: The patient's family history includes Arrhythmia in his brother and mother; Congestive Heart Failure in his brother; Diabetes in his father, mother, sister, and sister; Heart attack in his brother; Hypertension in his brother and father; Kidney disease in his brother; Other in his mother.  ROS:   Please see the history of present illness.     All other systems reviewed and are negative.  EKGs/Labs/Other Studies Reviewed:    EKG today shows sinus bradycardia, HR 53 bpm.    The following studies were reviewed today: Cardiac Studies & Procedures   CARDIAC CATHETERIZATION  CARDIAC  CATHETERIZATION 06/05/2018  Narrative Conclusions: 1. Significant three-vessel coronary artery disease, including diffuse LAD disease up to 70% (DFR of distal LAD is 0.71; cut-point for hemodynamic significance is 0.89), calcified 90% mid LCx stenosis as well as chronic occlusions of several OM branches, and chronic total occlusion of mid RCA with bridging and left-to-right collaterals. 2. Normal left ventricular filling pressure.  Recommendations: 1. Outpatient cardiac surgery consultation.  Though chest pain is atypical, he may not experience typical angina in the setting of diabetes mellitus.  He would benefit from CABG based on his coronary anatomy on history of diabetes mellitus. 2. Aggressive secondary prevention.  Yvonne Kendall, MD [facility] Pager: [phone]  Findings Coronary Findings Diagnostic  Dominance: Co-dominant  Left Main Vessel is large. Dist LM lesion is 25% stenosed.  Left Anterior Descending The vessel is severely calcified. Ost LAD lesion is 20% stenosed. Prox LAD lesion is 50% stenosed. The lesion is calcified. Pressure wire/FFR was performed on the lesion. DFR 0.71. Mid LAD-1  lesion is 40% stenosed. DFR 0.71 Mid LAD-2 lesion is 70% stenosed. DFR 0.71.  First Diagonal Branch Vessel is small in size.  Second Diagonal Branch Vessel is moderate in size. Ost 2nd Diag to 2nd Diag lesion is 80% stenosed.  Left Circumflex Vessel is large. The vessel is moderately calcified. Mid Cx lesion is 90% stenosed. The lesion is severely calcified.  First Obtuse Marginal Branch Vessel is moderate in size. 1st Mrg lesion is 90% stenosed.  Second Obtuse Marginal Branch Vessel is large in size. Collaterals 2nd Mrg filled by collaterals from 1st Mrg.  2nd Mrg-1 lesion is 80% stenosed. The lesion is eccentric. 2nd Mrg-2 lesion is 100% stenosed. The lesion is chronically occluded with left-to-left collateral flow.  Third Obtuse Marginal Branch Vessel is  moderate in size. Collaterals 3rd Mrg filled by collaterals from Dist LAD.  Ost 3rd Mrg lesion is 100% stenosed. The lesion is chronically occluded with left-to-left collateral flow.  Left Posterior Descending Artery Vessel is small in size. There is mild disease in the vessel.  Right Coronary Artery Vessel is moderate in size. Prox RCA lesion is 50% stenosed. Mid RCA lesion is 100% stenosed. The lesion is chronically occluded with bridging and left-to-right collateral flow.  Acute Marginal Branch Acute Mrg lesion is 70% stenosed.  Right Posterior Descending Artery Vessel is small in size.  Right Posterior Atrioventricular Artery Vessel is moderate in size.  First Right Posterolateral Branch Collaterals 1st RPL filled by collaterals from 2nd Sept.  Intervention  No interventions have been documented.   STRESS TESTS  MYOCARDIAL PERFUSION IMAGING 05/28/2018  Narrative  The left ventricular ejection fraction is moderately decreased (30-44%).  Nuclear stress EF: 36%.  Blood pressure demonstrated a normal response to exercise.  There was no ST segment deviation noted during stress.  Defect 1: There is a medium defect of moderate severity present in the basal inferolateral, basal anterolateral, mid inferolateral, mid anterolateral, apical inferior and apical lateral location.  Findings consistent with ischemia and prior myocardial infarction.  This is a high risk study.  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 Diminished LVEF.   ECHOCARDIOGRAM  ECHOCARDIOGRAM COMPLETE 05/07/2022  Narrative ECHOCARDIOGRAM REPORT    Patient Name:   HAZIEL MOLNER Date of Exam: 05/07/2022 Medical Rec #:  [id number]        Height:       68.0 in Accession #:    [id number]       Weight:       194.7 lb Date of Birth:  [birth year]        BSA:          2.020 m Patient Age:    71 years         BP:           105/76  mmHg Patient Gender: M                HR:           108 bpm. Exam Location:  Inpatient  Procedure: 2D Echo, Cardiac Doppler and Color Doppler  Indications:    I50.21 Acute systolic (congestive) heart failure  History:        Patient has prior history of Echocardiogram examinations. CAD, Prior CABG, Arrythmias:Atrial Flutter; Risk Factors:Diabetes and Hypertension.  Sonographer:    Mike Gip Referring Phys: [id number] SUDHAM CHAND  IMPRESSIONS   1. Left ventricular ejection fraction, by estimation, is 20 to 25%. The left ventricle  has severely decreased function. The left ventricle demonstrates global hypokinesis. The left ventricular internal cavity size was moderately dilated. There is mild left ventricular hypertrophy. Left ventricular diastolic parameters are indeterminate. 2. Right ventricular systolic function is moderately reduced. The right ventricular size is moderately enlarged. There is mildly elevated pulmonary artery systolic pressure. 3. Left atrial size was moderately dilated. 4. The mitral valve is abnormal. Moderate mitral valve regurgitation. No evidence of mitral stenosis. 5. Tricuspid valve regurgitation is moderate. 6. The aortic valve is tricuspid. There is moderate calcification of the aortic valve. There is moderate thickening of the aortic valve. Aortic valve regurgitation is not visualized. Aortic valve sclerosis/calcification is present, without any evidence of aortic stenosis. 7. Aortic dilatation noted. There is moderate dilatation of the ascending aorta, measuring 42 mm. 8. The inferior vena cava is dilated in size with <50% respiratory variability, suggesting right atrial pressure of 15 mmHg.  FINDINGS Left Ventricle: Left ventricular ejection fraction, by estimation, is 20 to 25%. The left ventricle has severely decreased function. The left ventricle demonstrates global hypokinesis. The left ventricular internal cavity size was moderately dilated. There  is mild left ventricular hypertrophy. Left ventricular diastolic parameters are indeterminate.  Right Ventricle: The right ventricular size is moderately enlarged. Right vetricular wall thickness was not assessed. Right ventricular systolic function is moderately reduced. There is mildly elevated pulmonary artery systolic pressure. The tricuspid regurgitant velocity is 2.41 m/s, and with an assumed right atrial pressure of 15 mmHg, the estimated right ventricular systolic pressure is 38.2 mmHg.  Left Atrium: Left atrial size was moderately dilated.  Right Atrium: Right atrial size was normal in size.  Pericardium: There is no evidence of pericardial effusion.  Mitral Valve: The mitral valve is abnormal. There is moderate thickening of the mitral valve leaflet(s). Moderate mitral valve regurgitation. No evidence of mitral valve stenosis.  Tricuspid Valve: The tricuspid valve is normal in structure. Tricuspid valve regurgitation is moderate . No evidence of tricuspid stenosis.  Aortic Valve: The aortic valve is tricuspid. There is moderate calcification of the aortic valve. There is moderate thickening of the aortic valve. Aortic valve regurgitation is not visualized. Aortic valve sclerosis/calcification is present, without any evidence of aortic stenosis.  Pulmonic Valve: The pulmonic valve was normal in structure. Pulmonic valve regurgitation is mild. No evidence of pulmonic stenosis.  Aorta: Aortic dilatation noted. There is moderate dilatation of the ascending aorta, measuring 42 mm.  Venous: The inferior vena cava is dilated in size with less than 50% respiratory variability, suggesting right atrial pressure of 15 mmHg.  IAS/Shunts: No atrial level shunt detected by color flow Doppler.   LEFT VENTRICLE PLAX 2D LVIDd:         6.10 cm      Diastology LVIDs:         5.30 cm      LV e' medial:    3.89 cm/s LV PW:         1.30 cm      LV E/e' medial:  30.6 LV IVS:        1.30 cm      LV  e' lateral:   8.68 cm/s LVOT diam:     2.10 cm      LV E/e' lateral: 13.7 LV SV:         30 LV SV Index:   15 LVOT Area:     3.46 cm  LV Volumes (MOD) LV vol d, MOD A2C: 202.0 ml LV vol d, MOD  A4C: 192.0 ml LV vol s, MOD A2C: 155.0 ml LV vol s, MOD A4C: 147.0 ml LV SV MOD A2C:     47.0 ml LV SV MOD A4C:     192.0 ml LV SV MOD BP:      52.3 ml  RIGHT VENTRICLE            IVC RV Basal diam:  5.10 cm    IVC diam: 2.50 cm RV S prime:     6.83 cm/s TAPSE (M-mode): 1.1 cm  LEFT ATRIUM              Index        RIGHT ATRIUM           Index LA diam:        4.30 cm  2.13 cm/m   RA Area:     29.70 cm LA Vol (A2C):   116.0 ml 57.41 ml/m  RA Volume:   99.60 ml  49.30 ml/m LA Vol (A4C):   97.7 ml  48.36 ml/m LA Biplane Vol: 108.0 ml 53.45 ml/m AORTIC VALVE             PULMONIC VALVE LVOT Vmax:   67.10 cm/s  PR End Diast Vel: 10.24 msec LVOT Vmean:  43.000 cm/s LVOT VTI:    0.086 m  AORTA Ao Root diam: 3.70 cm Ao Asc diam:  4.20 cm  MITRAL VALVE                  TRICUSPID VALVE MV Area (PHT): 4.26 cm       TR Peak grad:   23.2 mmHg MV Decel Time: 178 msec       TR Vmax:        241.00 cm/s MR Peak grad:    47.9 mmHg MR Mean grad:    30.0 mmHg    SHUNTS MR Vmax:         346.00 cm/s  Systemic VTI:  0.09 m MR Vmean:        252.0 cm/s   Systemic Diam: 2.10 cm MR PISA:         1.57 cm MR PISA Eff ROA: 15 mm MR PISA Radius:  0.50 cm MV E velocity: 119.00 cm/s  Charlton Haws MD Electronically signed by Charlton Haws MD Signature Date/Time: 05/07/2022/2:32:22 PM    Final   TEE  ECHO TEE 05/11/2022  Narrative TRANSESOPHOGEAL ECHO REPORT    Patient Name:   Jerry Myers Date of Exam: 05/11/2022 Medical Rec #:  [id number]          Height:       68.0 in Accession #:    [id number]         Weight:       186.5 lb Date of Birth:  [birth year]          BSA:          1.984 m Patient Age:    72 years           BP:           146/119 mmHg Patient Gender: M                  HR:            107 bpm. Exam Location:  Inpatient  Procedure: Transesophageal Echo, Cardiac Doppler and Color Doppler  Indications:     I50.21 Acute systolic (congestive) heart failure  History:  Patient has prior history of Echocardiogram examinations. Cardiomyopathy and CHF, CAD, Prior CABG; Risk Factors:Hypertension and Dyslipidemia.  Sonographer:     Leta Jungling RDCS Referring Phys:  Arty Baumgartner Diagnosing Phys: Weston Brass MD  PROCEDURE: TEE procedure time was 10 minutes. The transesophogeal probe was passed without difficulty through the esophogus of the patient. Imaged were obtained with the patient in a left lateral decubitus position. Sedation performed by different physician. The patient was monitored while under deep sedation. Anesthestetic sedation was provided intravenously by Anesthesiology: 80.37mg  of Propofol. Image quality was good. The patient developed no complications during the procedure. A successful direct current cardioversion was performed at 120 joules with 1 attempt.  IMPRESSIONS   1. Left ventricular ejection fraction, by estimation, is 20%. The left ventricle has severely decreased function. The left ventricular internal cavity size was severely dilated. 2. Right ventricular systolic function is moderately reduced. The right ventricular size is not well visualized. 3. Left atrial size was moderately dilated. No left atrial/left atrial appendage thrombus was detected. 4. The mitral valve is degenerative. Mild mitral valve regurgitation with BP 95/46 mmHg. 5. The aortic valve is grossly normal. There is mild calcification of the aortic valve. Aortic valve regurgitation not assessed.  FINDINGS Left Ventricle: Left ventricular ejection fraction, by estimation, is 20%. The left ventricle has severely decreased function. The left ventricular internal cavity size was severely dilated.  Right Ventricle: The right ventricular size is not well  visualized. Right vetricular wall thickness was not assessed. Right ventricular systolic function is moderately reduced.  Left Atrium: Left atrial size was moderately dilated. No left atrial/left atrial appendage thrombus was detected.  Right Atrium: Right atrial size was normal in size.  Pericardium: There is no evidence of pericardial effusion.  Mitral Valve: The mitral valve is degenerative in appearance. Mild mitral valve regurgitation.  Tricuspid Valve: The tricuspid valve is not assessed. Tricuspid valve regurgitation is not demonstrated.  Aortic Valve: The aortic valve is grossly normal. There is mild calcification of the aortic valve. Aortic valve regurgitation not assessed.  Pulmonic Valve: The pulmonic valve was not assessed. Pulmonic valve regurgitation is not visualized.  Aorta: Aortic root could not be assessed.  IAS/Shunts: The interatrial septum was not assessed.  Weston Brass MD Electronically signed by Weston Brass MD Signature Date/Time: 05/11/2022/11:13:57 AM    Final                 Recent Labs: 05/06/2022: B Natriuretic Peptide 955.5 05/07/2022: Magnesium 2.1 05/11/2022: ALT 67 10/12/2022: BUN 22; Creatinine, Ser 1.32; Hemoglobin 13.9; Platelets 248; Potassium 4.2; Sodium 143  Recent Lipid Panel    Component Value Date/Time   CHOL 142 12/31/2019 1105   TRIG 180 (H) 12/31/2019 1105   HDL 39 (L) 12/31/2019 1105   CHOLHDL 3.6 12/31/2019 1105   CHOLHDL 2.9 06/20/2018 1215   LDLCALC 73 12/31/2019 1105   LDLCALC 60 06/20/2018 1215     Risk Assessment/Calculations:    CHA2DS2-VASc Score = 5   This indicates a 7.2% annual risk of stroke. The patient's score is based upon: CHF History: 1 HTN History: 1 Diabetes History: 1 Stroke History: 0 Vascular Disease History: 1 Age Score: 1 Gender Score: 0   {This patient has a significant risk of stroke if diagnosed with atrial fibrillation.  Please consider VKA or DOAC agent for anticoagulation if  the bleeding risk is acceptable.   You can also use the SmartPhrase .[email or web address] for documentation.   :[id number]}  No BP recorded.  {  Refresh Note OR Click here to enter BP  :1}***         Physical Exam:    VS:  There were no vitals taken for this visit.    Wt Readings from Last 3 Encounters:  10/12/22 198 lb (89.8 kg)  05/21/22 170 lb 3.2 oz (77.2 kg)  05/15/22 177 lb 1.6 oz (80.3 kg)     GEN:  Well nourished, well developed in no acute distress HEENT: Normal NECK: No JVD; No carotid bruits LYMPHATICS: No lymphadenopathy CARDIAC: RRR, no murmurs, rubs, gallops RESPIRATORY:  Clear to auscultation without rales, wheezing or rhonchi  ABDOMEN: Soft, non-tender, non-distended MUSCULOSKELETAL:  No edema; No deformity  SKIN: Warm and dry NEUROLOGIC:  Alert and oriented x 3 PSYCHIATRIC:  Normal affect   ASSESSMENT:    No diagnosis found.  PLAN:    In order of problems listed above:  CAD-s/p CABG x 4 in 2020, Stable with no anginal symptoms. No indication for ischemic evaluation.  Continue aspirin 81 mg daily, not on a beta-blocker secondary to bradycardia.  HFrEF - NYHA class 1, euvolemic. Will start Entresto 24/26 mg BID, BP in office 180/90 > 170/78. BB - no d/t bradycardia, MRA - consider at future visit, SGLT2i - consider at future visit.  Hypertension-blood pressure is very elevated in the office today at 170/78, will start him on Entresto 24/26 milligrams twice daily and stop midodrine. Paroxysmal atrial fibrillation/hypercoagulable state/on amiodarone therapy-CHA2DS2-VASc score 5, continue Eliquis 5 mg twice daily--no indication for dose reduction, continue amiodarone 200 mg twice daily. Will repeat CBC, BMET today.  Disposition - CBC, BMET, start Entresto 24/26 BID, stop Midodrine. Return in 2 weeks for repeat BMET. Return in 4 weeks. Need TSH, LFT at next visit.      **addendum, pt is an active hospice patient with [facility]. We started him on Entresto, however not  sure if this in on their formulary for hospice. Discussed with Dr. Josiah Lobo, decreased him amiodarone to 200 mg daily x 7 days, then 100 mg daily thereafter. We faxed these recommendations to [facility] as they fill his medications for him.        Medication Adjustments/Labs and Tests Ordered: Current medicines are reviewed at length with the patient today.  Concerns regarding medicines are outlined above.  No orders of the defined types were placed in this encounter.  No orders of the defined types were placed in this encounter.   There are no Patient Instructions on file for this visit.   Signed, Flossie Dibble, NP  11/08/2022 7:52 PM    [facility]

## 2022-11-09 ENCOUNTER — Ambulatory Visit: Payer: 59 | Admitting: Cardiology

## 2023-08-29 ENCOUNTER — Telehealth: Payer: Self-pay | Admitting: Cardiology

## 2023-08-29 NOTE — Telephone Encounter (Signed)
 Called patient to return forms fee ($29) cash as it wasn't collected properly last year- patient states he will come tomorrow, 08/30/23 to pick up/kbl 08/29/23

## 2023-09-03 NOTE — Telephone Encounter (Signed)
 Patient picked up $29 dollars cash/kbl 09/03/23
